# Patient Record
Sex: Female | Born: 1935 | Race: White | Hispanic: No | Marital: Married | Smoking: Never smoker
Health system: Southern US, Community
[De-identification: ages and names within clinical notes are randomized; demographics above are authoritative.]

## PROBLEM LIST (undated history)

## (undated) DIAGNOSIS — I480 Paroxysmal atrial fibrillation: Secondary | ICD-10-CM

## (undated) DIAGNOSIS — Q249 Congenital malformation of heart, unspecified: Secondary | ICD-10-CM

## (undated) DIAGNOSIS — I739 Peripheral vascular disease, unspecified: Secondary | ICD-10-CM

## (undated) DIAGNOSIS — I2510 Atherosclerotic heart disease of native coronary artery without angina pectoris: Secondary | ICD-10-CM

## (undated) DIAGNOSIS — I509 Heart failure, unspecified: Secondary | ICD-10-CM

## (undated) DIAGNOSIS — H409 Unspecified glaucoma: Secondary | ICD-10-CM

## (undated) DIAGNOSIS — I38 Endocarditis, valve unspecified: Secondary | ICD-10-CM

## (undated) DIAGNOSIS — M1990 Unspecified osteoarthritis, unspecified site: Secondary | ICD-10-CM

## (undated) DIAGNOSIS — I499 Cardiac arrhythmia, unspecified: Secondary | ICD-10-CM

## (undated) DIAGNOSIS — R011 Cardiac murmur, unspecified: Secondary | ICD-10-CM

## (undated) DIAGNOSIS — Z950 Presence of cardiac pacemaker: Secondary | ICD-10-CM

## (undated) DIAGNOSIS — Z951 Presence of aortocoronary bypass graft: Secondary | ICD-10-CM

## (undated) DIAGNOSIS — Z95810 Presence of automatic (implantable) cardiac defibrillator: Secondary | ICD-10-CM

## (undated) DIAGNOSIS — G43909 Migraine, unspecified, not intractable, without status migrainosus: Secondary | ICD-10-CM

## (undated) DIAGNOSIS — E119 Type 2 diabetes mellitus without complications: Secondary | ICD-10-CM

## (undated) DIAGNOSIS — E1122 Type 2 diabetes mellitus with diabetic chronic kidney disease: Secondary | ICD-10-CM

## (undated) DIAGNOSIS — R32 Unspecified urinary incontinence: Secondary | ICD-10-CM

## (undated) DIAGNOSIS — E785 Hyperlipidemia, unspecified: Secondary | ICD-10-CM

## (undated) DIAGNOSIS — I10 Essential (primary) hypertension: Secondary | ICD-10-CM

## (undated) DIAGNOSIS — I6521 Occlusion and stenosis of right carotid artery: Secondary | ICD-10-CM

## (undated) HISTORY — DX: Cardiac arrhythmia, unspecified: I49.9

## (undated) HISTORY — DX: Hyperlipidemia, unspecified: E78.5

## (undated) HISTORY — DX: Presence of automatic (implantable) cardiac defibrillator: Z95.810

## (undated) HISTORY — PX: PPM GENERATOR CHANGEOUT: EP1233

## (undated) HISTORY — DX: Presence of cardiac pacemaker: Z95.0

## (undated) HISTORY — DX: Unspecified glaucoma: H40.9

## (undated) HISTORY — DX: Unspecified osteoarthritis, unspecified site: M19.90

## (undated) HISTORY — PX: CORONARY ARTERY BYPASS GRAFT: SHX141

## (undated) HISTORY — PX: ABDOMINAL HYSTERECTOMY: SHX81

## (undated) HISTORY — DX: Unspecified urinary incontinence: R32

## (undated) HISTORY — DX: Migraine, unspecified, not intractable, without status migrainosus: G43.909

## (undated) HISTORY — DX: Type 2 diabetes mellitus without complications: E11.9

## (undated) HISTORY — DX: Congenital malformation of heart, unspecified: Q24.9

## (undated) HISTORY — PX: CARDIAC DEFIBRILLATOR PLACEMENT: SHX171

## (undated) HISTORY — DX: Cardiac murmur, unspecified: R01.1

---
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
## 1978-07-07 HISTORY — PX: BACK SURGERY: SHX140

## 2013-07-07 HISTORY — PX: HIP FRACTURE SURGERY: SHX118

## 2019-04-01 ENCOUNTER — Ambulatory Visit (INDEPENDENT_AMBULATORY_CARE_PROVIDER_SITE_OTHER): Payer: Medicare Other | Admitting: Family Medicine

## 2019-04-01 ENCOUNTER — Other Ambulatory Visit: Payer: Self-pay

## 2019-04-01 ENCOUNTER — Encounter: Payer: Self-pay | Admitting: Family Medicine

## 2019-04-01 VITALS — BP 118/60 | HR 63 | Temp 98.6°F | Ht 66.0 in | Wt 105.4 lb

## 2019-04-01 DIAGNOSIS — D508 Other iron deficiency anemias: Secondary | ICD-10-CM | POA: Diagnosis not present

## 2019-04-01 DIAGNOSIS — Z9181 History of falling: Secondary | ICD-10-CM

## 2019-04-01 DIAGNOSIS — Z23 Encounter for immunization: Secondary | ICD-10-CM | POA: Diagnosis not present

## 2019-04-01 DIAGNOSIS — I509 Heart failure, unspecified: Secondary | ICD-10-CM | POA: Insufficient documentation

## 2019-04-01 DIAGNOSIS — G4700 Insomnia, unspecified: Secondary | ICD-10-CM

## 2019-04-01 DIAGNOSIS — Z951 Presence of aortocoronary bypass graft: Secondary | ICD-10-CM

## 2019-04-01 DIAGNOSIS — I2510 Atherosclerotic heart disease of native coronary artery without angina pectoris: Secondary | ICD-10-CM

## 2019-04-01 DIAGNOSIS — E119 Type 2 diabetes mellitus without complications: Secondary | ICD-10-CM | POA: Diagnosis not present

## 2019-04-01 DIAGNOSIS — I38 Endocarditis, valve unspecified: Secondary | ICD-10-CM | POA: Insufficient documentation

## 2019-04-01 DIAGNOSIS — I480 Paroxysmal atrial fibrillation: Secondary | ICD-10-CM

## 2019-04-01 DIAGNOSIS — R531 Weakness: Secondary | ICD-10-CM

## 2019-04-01 DIAGNOSIS — I10 Essential (primary) hypertension: Secondary | ICD-10-CM

## 2019-04-01 DIAGNOSIS — I6521 Occlusion and stenosis of right carotid artery: Secondary | ICD-10-CM

## 2019-04-01 DIAGNOSIS — E1122 Type 2 diabetes mellitus with diabetic chronic kidney disease: Secondary | ICD-10-CM

## 2019-04-01 DIAGNOSIS — Z95810 Presence of automatic (implantable) cardiac defibrillator: Secondary | ICD-10-CM | POA: Insufficient documentation

## 2019-04-01 DIAGNOSIS — I739 Peripheral vascular disease, unspecified: Secondary | ICD-10-CM

## 2019-04-01 DIAGNOSIS — H409 Unspecified glaucoma: Secondary | ICD-10-CM | POA: Insufficient documentation

## 2019-04-01 HISTORY — DX: Atherosclerotic heart disease of native coronary artery without angina pectoris: I25.10

## 2019-04-01 HISTORY — DX: Essential (primary) hypertension: I10

## 2019-04-01 HISTORY — DX: Occlusion and stenosis of right carotid artery: I65.21

## 2019-04-01 HISTORY — DX: Heart failure, unspecified: I50.9

## 2019-04-01 HISTORY — DX: Presence of aortocoronary bypass graft: Z95.1

## 2019-04-01 HISTORY — DX: Type 2 diabetes mellitus with diabetic chronic kidney disease: E11.22

## 2019-04-01 HISTORY — DX: Paroxysmal atrial fibrillation: I48.0

## 2019-04-01 HISTORY — DX: Peripheral vascular disease, unspecified: I73.9

## 2019-04-01 HISTORY — DX: Endocarditis, valve unspecified: I38

## 2019-04-01 LAB — CBC WITH DIFFERENTIAL/PLATELET
Basophils Absolute: 0 10*3/uL (ref 0.0–0.1)
Basophils Relative: 0.5 % (ref 0.0–3.0)
Eosinophils Absolute: 0.2 10*3/uL (ref 0.0–0.7)
Eosinophils Relative: 2 % (ref 0.0–5.0)
HCT: 36.2 % (ref 36.0–46.0)
Hemoglobin: 12 g/dL (ref 12.0–15.0)
Lymphocytes Relative: 25 % (ref 12.0–46.0)
Lymphs Abs: 2 10*3/uL (ref 0.7–4.0)
MCHC: 33 g/dL (ref 30.0–36.0)
MCV: 91.3 fl (ref 78.0–100.0)
Monocytes Absolute: 0.4 10*3/uL (ref 0.1–1.0)
Monocytes Relative: 5.7 % (ref 3.0–12.0)
Neutro Abs: 5.3 10*3/uL (ref 1.4–7.7)
Neutrophils Relative %: 66.8 % (ref 43.0–77.0)
Platelets: 266 10*3/uL (ref 150.0–400.0)
RBC: 3.97 Mil/uL (ref 3.87–5.11)
RDW: 14 % (ref 11.5–15.5)
WBC: 7.9 10*3/uL (ref 4.0–10.5)

## 2019-04-01 LAB — COMPREHENSIVE METABOLIC PANEL
ALT: 8 U/L (ref 0–35)
AST: 14 U/L (ref 0–37)
Albumin: 4.4 g/dL (ref 3.5–5.2)
Alkaline Phosphatase: 49 U/L (ref 39–117)
BUN: 36 mg/dL — ABNORMAL HIGH (ref 6–23)
CO2: 21 mEq/L (ref 19–32)
Calcium: 9.6 mg/dL (ref 8.4–10.5)
Chloride: 107 mEq/L (ref 96–112)
Creatinine, Ser: 1.49 mg/dL — ABNORMAL HIGH (ref 0.40–1.20)
GFR: 32.97 mL/min — ABNORMAL LOW (ref >60.00)
Glucose, Bld: 92 mg/dL (ref 70–99)
Potassium: 4.8 mEq/L (ref 3.5–5.1)
Sodium: 138 mEq/L (ref 135–145)
Total Bilirubin: 0.5 mg/dL (ref 0.2–1.2)
Total Protein: 7.1 g/dL (ref 6.0–8.3)

## 2019-04-01 LAB — IBC + FERRITIN
Ferritin: 102 ng/mL (ref 10.0–291.0)
Iron: 67 ug/dL (ref 42–145)
Saturation Ratios: 18.3 % — ABNORMAL LOW (ref 20.0–50.0)
Transferrin: 261 mg/dL (ref 212.0–360.0)

## 2019-04-01 LAB — HEMOGLOBIN A1C: Hgb A1c MFr Bld: 6.3 % (ref 4.6–6.5)

## 2019-04-01 LAB — LIPID PANEL
Cholesterol: 170 mg/dL (ref 0–200)
HDL: 63.8 mg/dL (ref >39.00)
LDL Cholesterol: 89 mg/dL (ref 0–99)
NonHDL: 106.52
Total CHOL/HDL Ratio: 3
Triglycerides: 87 mg/dL (ref 0.0–149.0)
VLDL: 17.4 mg/dL (ref 0.0–40.0)

## 2019-04-01 MED ORDER — MULTAQ 400 MG PO TABS: 400.0000 mg | ORAL_TABLET | Freq: Two times a day (BID) | ORAL | 1 refills | Status: DC

## 2019-04-01 NOTE — Patient Instructions (Signed)
For insomnia: 1) melatonin, chamomile tea and lavender oil to start 2) if can't fall asleep get a book and read 3) if this isn't helpful we can try trazodone.  4) another natural drug is ashwagandha, just clear it with cardiology first. 300mg  before bed.   Please get records for shots/etc..   Checking labs today. See you back in 1-3 months.   So nice to meet you! Dr. Rogers Blocker

## 2019-04-01 NOTE — Progress Notes (Signed)
Patient: Amy Wolf MRN: [id number] DOB: 08-27-1927 PCP: Orma Flaming, MD     Subjective:  Chief Complaint  Patient presents with  . Establish Care  . Insomnia    HPI: The patient is a 85 y.o. female who presents today for establishing care. She has an extensive cardiac history and has complaints of insomnia.   Hypertension: Here for follow up of hypertension.  Currently on norvasc 10mg , lopressor 10mg  and altace 2.5mg .  Takes medication as prescribed and denies any side effects. Exercise includes none. Weight has been stable. Denies any chest pain, headaches, shortness of breath, vision changes, swelling in lower extremities.   Diabetes: Patient is here for follow up of type 2 diabetes. First diagnosed years ago.  Currently on the following medications glipizide at 10mg . Takes medications as prescribed. Last A1C was unknown. Currently not exercising and not following diabetic diet. Does not check sugars. Denies any hypoglycemic events. Denies any vision changes, nausea, vomiting, abdominal pain, ulcers/paraesthesia in feet, polyuria, polydipsia or polyphagia. Denies any chest pain, shortness of breath. Was being treated for diabetes by cardiologist who retired.   CAD with history of a CABG: unsure when cabg was. On pravachol 80mg  and beta blocker. No records with this procedure, no echo. Records do say last EF was 55% in 2018. Already have appointment with cardiology.   CHF: no records but has ICD placed. On beta blocker and ACE-I. euvolemic today. Routine lab work and has f/u with cardiology. Needs echo, will let them order.   Paroxysmal afib: per cardiology note in June of 2020-was on warafarin in the remote past. This was stopped due to renal function. Currently on plavix and ASA was added by vascular surgeon for carotid stenosis. Cardiology wanted to put back on warfarin. CHADS2 score is 8. Discussed with family and she is a very high fall risk. In sinus rhythm.   Right carotid  atherosclerosis: report shows 70-99% blockage. Surgeon told family 80%. Put on ASA. Last carotid ultrasound 12/2018. No follow up here. Will discuss with cardiology.   Insomnia: she states when she goes to bed she gets "wide awake." she has a problem falling asleep, not staying asleep. This has gotten worse since they moved. She will go to bed at 9:00pm and at times will be awake all night. She does not have screen time prior to bed, no naps after 2:00pm, no caffiene. No exercise and it's hard for her to walk. She is in [facility] She has not taken any melatonin. She did take benadryl and this did not help her at all. She has taken px drugs for sleep in the past for a short term. Daughter is here and states her memory is pretty intact. She sleeps in separate room from her husband. She does not snore. A good night of sleep is 4-6 hours/day. She is not really tired during the day. No RLS symptoms.   Weak/fall risk: in [facility] when leaving home. Uses cane around home, but has fallen multiple times. Refuses to use walker. Very weak/debilitated. Interested in [place].   Review of Systems  Constitutional: Negative for chills, fatigue and fever.  HENT: Negative for dental problem, ear pain, hearing loss and trouble swallowing.   Eyes: Negative for visual disturbance.  Respiratory: Negative for cough, chest tightness and shortness of breath.   Cardiovascular: Negative for chest pain, palpitations and leg swelling.  Gastrointestinal: Negative for abdominal pain, blood in stool, diarrhea, nausea and vomiting.  Endocrine: Negative for cold intolerance, polydipsia, polyphagia and polyuria.  Genitourinary: Negative for dysuria and hematuria.  Musculoskeletal: Negative for arthralgias, back pain and neck pain.  Skin: Negative for rash.  Neurological: Positive for weakness (debilitated ). Negative for dizziness, speech difficulty and headaches.  Psychiatric/Behavioral: Positive for sleep disturbance. Negative for confusion and  dysphoric mood. The patient is not nervous/anxious.     Allergies Patient is allergic to iodine; erythromycin; penicillins; and sulfa antibiotics.  Past Medical History Patient  has a past medical history of AICD (automatic cardioverter/defibrillator) present, Glaucoma, Hyperlipemia, and Pacemaker.  Surgical History Patient  has a past surgical history that includes Abdominal hysterectomy; Back surgery (1980); Hip fracture surgery (2015); Coronary artery bypass graft; Cardiac defibrillator placement; and PPM GENERATOR CHANGEOUT.  Family History Pateint's family history is not on file.  Social History Patient  reports that she has never smoked. She has never used smokeless tobacco. She reports previous alcohol use. She reports that she does not use drugs.    Objective: Vitals:   04/01/19 1036  BP: 118/60  Pulse: 63  Temp: 98.6 F (37 C)  TempSrc: Skin  SpO2: 97%  Weight: 105 lb 6.4 oz (47.8 kg)  Height: 5\' 6"  (1.676 m)    Body mass index is 17.01 kg/m.  Physical Exam Vitals signs reviewed.  Constitutional:      Comments: Cachetic and frail appearing in WC  HENT:     Head: Normocephalic and atraumatic.     Right Ear: External ear normal.     Left Ear: External ear normal.     Nose: Nose normal.  Neck:     Musculoskeletal: Normal range of motion and neck supple.     Vascular: Carotid bruit (bilaterally ) present.  Cardiovascular:     Rate and Rhythm: Normal rate and regular rhythm.     Heart sounds: Normal heart sounds.     Comments: dpt pulses poor bilaterally  Pulmonary:     Effort: Pulmonary effort is normal.     Breath sounds: Normal breath sounds.  Abdominal:     General: Abdomen is flat. Bowel sounds are normal.     Palpations: Abdomen is soft.  Skin:    General: Skin is warm and dry.     Comments: Bruising over forearms bilaterally   Neurological:     General: No focal deficit present.     Mental Status: She is alert.  Psychiatric:        Mood and  Affect: Mood normal.        Behavior: Behavior normal.        Depression screen [facility] 2/9 04/01/2019  Decreased Interest 3  Down, Depressed, Hopeless 0  PHQ - 2 Score 3  Altered sleeping 3  Tired, decreased energy 0  Change in appetite 3  Feeling bad or failure about yourself  0  Trouble concentrating 0  Moving slowly or fidgety/restless 0  Suicidal thoughts 0  PHQ-9 Score 9  Difficult doing work/chores Somewhat difficult    Assessment/plan: 1. Benign essential HTN Blood pressure is to goal. Continue current anti-hypertensive medications. Refills not needed. routine lab work will be done today. Recommended routine exercise (to what she can do) and healthy diet including DASH diet and mediterranean diet.F/u in 6 months.   - Lipid panel  2. Controlled type 2 diabetes mellitus without complication, without long-term current use of insulin (HCC) On glipizide 10mg  daily. Discussed I do not like this drug due to common hypoglycemic episodes/risks. Will check a1c today and see where she is at. At 85 years  of age, I do not want her tightly controlled. Has seen eye doctor, request records. She is sure she got her pnuemonia shot, but need this record. F/u in 3 months.  - Hemoglobin A1c - CBC with Differential/Platelet - Comprehensive metabolic panel - Lipid panel  3. Coronary artery disease involving native coronary artery of native heart without angina pectoris Already has appointment cardiology. Continue beta blocker, pravachol, plavix.  - Lipid panel  4. Insomnia, unspecified type Discussed at her age I will not use ambien. She is doing sleep hygiene for the most part. Want her to try melatonin, lavender oil and possibly ashwagandha. If does not help we can do trial of low dose trazodone. Daughter is here and plan discussed as well as written down. F/u in 3 months.   5. Other iron deficiency anemia  - IBC + Ferritin  6. Paroxysmal A-fib (HCC) On plavix/asa. Was on warfarin in the  past and I think this was stopped due to anemia. Do not have all records here. Her CHADs2 score is an 8. Xarelto was not started due to wanting to get renal function from past cardiologist. He did intend to start this back, but she did not f/u as they moved here. Discussed with daughter and patient that her score is high and does indicate therapy; however, she is a high fall risk and I am not keen to start this. Would continue asa and discuss with cardiology. Sinus rhythm today.    7. Atherosclerosis of right carotid artery Recent carotid u/s. ASA added and now on asa +plavix. F/u with cardiology for routine monitoring.   8. At risk for falling  - Ambulatory referral to [place]  9. Weakness generalized  - Ambulatory referral to Home Health  10. Need for immunization against influenza  - Flu Vaccine QUAD High Dose(Fluad)   Return in about 3 months (around 07/01/2019) for insomnia/diabetes .   Orma Flaming, MD [place]   04/01/2019

## 2019-04-04 ENCOUNTER — Other Ambulatory Visit: Payer: Self-pay | Admitting: Family Medicine

## 2019-04-04 DIAGNOSIS — N183 Chronic kidney disease, stage 3 unspecified: Secondary | ICD-10-CM | POA: Insufficient documentation

## 2019-04-04 HISTORY — DX: Chronic kidney disease, stage 3 unspecified: N18.30

## 2019-04-04 MED ORDER — GLIPIZIDE ER 5 MG PO TB24: 5.0000 mg | ORAL_TABLET | Freq: Every day | ORAL | 1 refills | Status: DC

## 2019-04-05 ENCOUNTER — Telehealth: Payer: Self-pay

## 2019-04-05 NOTE — Telephone Encounter (Signed)
Copied from [place] [phone]. Topic: General - Other >> Apr 05, 2019  3:04 PM Pauline Good wrote: Reason for CRM: Dian Situ is stating Dr Rogers Blocker isn't pecos certified and can't sign home health orders and they need to know who can sign the orders.

## 2019-04-06 NOTE — Telephone Encounter (Signed)
Sending to [place]. This is the 4th time we have had an issue with my credentialing. I have signed other home health orders so not sure what is going on.  Orma Flaming, MD [place]

## 2019-04-07 NOTE — Progress Notes (Signed)
CARDIOLOGY CONSULT NOTE       Patient IDStefanee Wolf MRN: [id number] DOB/AGE: [birth year] 85 y.o.  Admit date: (Not on file) Referring Physician: Self recent move to area Primary Physician: Orma Flaming, MD Primary Cardiologist: New Reason for Consultation: CAD/PAF  Active Problems:   * No active hospital problems. *   HPI:  85 y.o. with HTN, DM, CAD, anemia and carotid disease. History of PAF. Currently not on anticoagulation ? Due to anemia and fall risk with age. CHADVASC 8.  Seen by new local primary Orma Flaming 04/01/19 Carotid report from [facility] [id number] reviewed LICA 0000000 and RICA 70-99% velocities on right PSV 2.93 m/sec EDV 0.5 m/sec She has had a previous right sided CVA   Records review is limited. CAD with AICD and previous mitral and tricuspid valve surgery with CABG  She has had GI bleed last year. She has very thin skin and is covered in bruises In NSR today with AV pacing No angina Daughter thinks she is a fall risk and won't use a walker No TIA symptoms. No CHF or angina Compliant with meds Indicates surgery was done at [facility] in 2015  Her AICD has not been checked in over a year   ROS All other systems reviewed and negative except as noted above  Past Medical History:  Diagnosis Date  . AICD (automatic cardioverter/defibrillator) present   . Arrhythmia   . Arthritis   . Atherosclerosis of right carotid artery 04/01/2019   83% blockage. Last ultrasound in 12/2018. Saw vascular surgery in June 2020. Put on DPT in June (asa added by Psychologist, sport and exercise)  . Benign essential HTN 04/01/2019  . CAD (coronary artery disease) 04/01/2019  . Cardiac arrhythmia due to congenital heart disease   . CHF (congestive heart failure) ([place]) 04/01/2019  . Chronic kidney disease (CKD), stage III (moderate) 04/04/2019  . Diabetes ([place])   . Glaucoma   . Heart murmur   . Hx of CABG 04/01/2019   Several years ago per family   . Hyperlipemia   . Migraines   .  Pacemaker   . Paroxysmal A-fib ([place]) 04/01/2019   warafin in the remote past, but changed to plavix. CHADS2 score of 8%. High fall risk...   . PVD (peripheral vascular disease) ([place]) 04/01/2019  . Type 2 diabetes mellitus with diabetic chronic kidney disease ([place]) 04/01/2019  . Urine incontinence   . Valvular heart disease 04/01/2019   Mitral and tricuspid annuloplasty     Family History  Problem Relation Age of Onset  . Cancer Father   . Cancer Sister   . Cancer Daughter     Social History   Socioeconomic History  . Marital status: Married    Spouse name: Not on file  . Number of children: Not on file  . Years of education: Not on file  . Highest education level: Not on file  Occupational History  . Not on file  Social Needs  . Financial resource strain: Not on file  . Food insecurity    Worry: Not on file    Inability: Not on file  . Transportation needs    Medical: Not on file    Non-medical: Not on file  Tobacco Use  . Smoking status: Never Smoker  . Smokeless tobacco: Never Used  Substance and Sexual Activity  . Alcohol use: Not Currently  . Drug use: Never  . Sexual activity: Not Currently  Lifestyle  . Physical activity    Days  per week: Not on file    Minutes per session: Not on file  . Stress: Not on file  Relationships  . Social Herbalist on phone: Not on file    Gets together: Not on file    Attends religious service: Not on file    Active member of club or organization: Not on file    Attends meetings of clubs or organizations: Not on file    Relationship status: Not on file  . Intimate partner violence    Fear of current or ex partner: Not on file    Emotionally abused: Not on file    Physically abused: Not on file    Forced sexual activity: Not on file  Other Topics Concern  . Not on file  Social History Narrative  . Not on file    Past Surgical History:  Procedure Laterality Date  . ABDOMINAL HYSTERECTOMY    . BACK SURGERY  1980   . CARDIAC DEFIBRILLATOR PLACEMENT    . CORONARY ARTERY BYPASS GRAFT    . HIP FRACTURE SURGERY  2015  . PPM GENERATOR CHANGEOUT          Physical Exam: Blood pressure 120/65, pulse 66, height 5\' 6"  (1.676 m), weight 105 lb (47.6 kg), SpO2 99 %.   Affect appropriate Frail elderly female  HEENT: normal Neck supple with no adenopathy JVP normal bilateral bruits no thyromegaly Lungs clear with no wheezing and good diaphragmatic motion Heart:  S1/S2 no murmur, no rub, gallop or click PMI enlarged AICD under left clavicle  Abdomen: benighn, BS positve, no tenderness, no AAA no bruit.  No HSM or HJR Distal pulses intact with no bruits No edema Neuro non-focal Skin warm and dry No muscular weakness   Labs:   Lab Results  Component Value Date   WBC 7.9 04/01/2019   HGB 12.0 04/01/2019   HCT 36.2 04/01/2019   MCV 91.3 04/01/2019   PLT 266.0 04/01/2019    Recent Labs  Lab 04/01/19 1133  NA 138  K 4.8  CL 107  CO2 21  BUN 36*  CREATININE 1.49*  CALCIUM 9.6  PROT 7.1  BILITOT 0.5  ALKPHOS 49  ALT 8  AST 14  GLUCOSE 92   No results found for: CKTOTAL, CKMB, CKMBINDEX, TROPONINI  Lab Results  Component Value Date   CHOL 170 04/01/2019   Lab Results  Component Value Date   HDL 63.80 04/01/2019   Lab Results  Component Value Date   LDLCALC 89 04/01/2019   Lab Results  Component Value Date   TRIG 87.0 04/01/2019   Lab Results  Component Value Date   CHOLHDL 3 04/01/2019   No results found for: LDLDIRECT    Radiology: No results found.  EKG: AV pacing no old ECG to compare    ASSESSMENT AND PLAN:   1. CAD/CABG:  Will get records from [place] no angina continue medical Rx 2.  Valve Dx:  History of TV/MV annuloplasty f/u echo to assess  3. PV Dx:  Carotid disease ? High grade on right will repeat duplex and refer to VVS continue ASA/Plavix  4. AICD/PPM:  Needs referral to EP and interrogation for degree of PAF 5. Anticoagulation defer for now until  we know afib burden ASA and plavix useful for her CAD and carotid disease Given history of GI bleed on eliquis would defer anticoagulation in favor of antiplatelet Rx unless afib burden is high  6. HLD:  Continue statin LDL  89 on 04/01/19  7. Anemia:  Hct 36.2 historically stable labs 04/01/19 continue iron replacement ferritin normal  8. DM: Discussed low carb diet.  Target hemoglobin A1c is 6.5 or less.  Continue current medications. A1c reasonable at 6.3  9.  HTN:  On ACE good control   Tests Ordered:  Carotid duplex and echo  Referral:  EP for AICD/PAF and VVS for carotid disease  F/u with me in 6 months   Signed: Jenkins Rouge 04/08/2019, 11:27 AM

## 2019-04-08 ENCOUNTER — Encounter: Payer: Self-pay | Admitting: Cardiovascular Disease

## 2019-04-08 ENCOUNTER — Other Ambulatory Visit: Payer: Self-pay

## 2019-04-08 ENCOUNTER — Encounter: Payer: Self-pay | Admitting: Family Medicine

## 2019-04-08 ENCOUNTER — Ambulatory Visit (INDEPENDENT_AMBULATORY_CARE_PROVIDER_SITE_OTHER): Payer: Medicare Other | Admitting: Cardiovascular Disease

## 2019-04-08 VITALS — BP 120/65 | HR 66 | Ht 66.0 in | Wt 105.0 lb

## 2019-04-08 DIAGNOSIS — I079 Rheumatic tricuspid valve disease, unspecified: Secondary | ICD-10-CM | POA: Diagnosis not present

## 2019-04-08 DIAGNOSIS — I25810 Atherosclerosis of coronary artery bypass graft(s) without angina pectoris: Secondary | ICD-10-CM | POA: Diagnosis not present

## 2019-04-08 DIAGNOSIS — I059 Rheumatic mitral valve disease, unspecified: Secondary | ICD-10-CM

## 2019-04-08 DIAGNOSIS — I6523 Occlusion and stenosis of bilateral carotid arteries: Secondary | ICD-10-CM

## 2019-04-08 DIAGNOSIS — I480 Paroxysmal atrial fibrillation: Secondary | ICD-10-CM

## 2019-04-08 NOTE — Patient Instructions (Addendum)
Medication Instructions:   If you need a refill on your cardiac medications before your next appointment, please call your pharmacy.   Lab work:  If you have labs (blood work) drawn today and your tests are completely normal, you will receive your results only by: Marland Kitchen MyChart Message (if you have MyChart) OR . A paper copy in the mail If you have any lab test that is abnormal or we need to change your treatment, we will call you to review the results.  Testing/Procedures: Your physician has requested that you have a carotid duplex. This test is an ultrasound of the carotid arteries in your neck. It looks at blood flow through these arteries that supply the brain with blood. Allow one hour for this exam. There are no restrictions or special instructions.  Your physician has requested that you have an echocardiogram. Echocardiography is a painless test that uses sound waves to create images of your heart. It provides your doctor with information about the size and shape of your heart and how well your heart's chambers and valves are working. This procedure takes approximately one hour. There are no restrictions for this procedure.  Follow-Up: At [facility], you and your health needs are our priority.  As part of our continuing mission to provide you with exceptional heart care, we have created designated Provider Care Teams.  These Care Teams include your primary Cardiologist (physician) and Advanced Practice Providers (APPs -  Physician Assistants and Nurse Practitioners) who all work together to provide you with the care you need, when you need it. You will need a follow up appointment in 6 months.  Please call our office 2 months in advance to schedule this appointment.  You may see Dr. Johnsie Cancel or one of the following Advanced Practice Providers on your designated Care Team:   Truitt Merle, NP Cecilie Kicks, NP . Kathyrn Drown, NP  You have been referred to Vascular Doctors for History of  Carotid Artery Disease  You have been referred to Electrophysiologist as new patient with device.

## 2019-04-12 ENCOUNTER — Other Ambulatory Visit: Payer: Self-pay

## 2019-04-12 ENCOUNTER — Ambulatory Visit (HOSPITAL_COMMUNITY): Payer: Medicare Other | Attending: Cardiology

## 2019-04-12 ENCOUNTER — Telehealth: Payer: Self-pay

## 2019-04-12 DIAGNOSIS — I25810 Atherosclerosis of coronary artery bypass graft(s) without angina pectoris: Secondary | ICD-10-CM

## 2019-04-12 DIAGNOSIS — I6523 Occlusion and stenosis of bilateral carotid arteries: Secondary | ICD-10-CM | POA: Diagnosis not present

## 2019-04-12 DIAGNOSIS — I059 Rheumatic mitral valve disease, unspecified: Secondary | ICD-10-CM | POA: Diagnosis present

## 2019-04-12 DIAGNOSIS — I079 Rheumatic tricuspid valve disease, unspecified: Secondary | ICD-10-CM | POA: Diagnosis present

## 2019-04-12 NOTE — Telephone Encounter (Signed)
I spoke with the pt daughter to get the pt ICD model and serial number. I put the model and serial number in [employer] and I enrolled the pt in [employer]. I ordered the pt a monitor and cell adapter.

## 2019-04-13 ENCOUNTER — Other Ambulatory Visit: Payer: Self-pay | Admitting: Cardiovascular Disease

## 2019-04-13 ENCOUNTER — Ambulatory Visit (HOSPITAL_COMMUNITY)
Admission: RE | Admit: 2019-04-13 | Discharge: 2019-04-13 | Disposition: A | Discharge status: Home / Self Care | Payer: Medicare Other | Source: Ambulatory Visit | Attending: Cardiology | Admitting: Cardiology

## 2019-04-13 ENCOUNTER — Telehealth: Payer: Self-pay

## 2019-04-13 DIAGNOSIS — I6523 Occlusion and stenosis of bilateral carotid arteries: Secondary | ICD-10-CM

## 2019-04-13 DIAGNOSIS — I6521 Occlusion and stenosis of right carotid artery: Secondary | ICD-10-CM

## 2019-04-13 NOTE — Telephone Encounter (Signed)
-----   Message from Josue Hector, MD sent at 04/13/2019  3:54 PM EDT ----- 40-59% RICA stenosis f/u duplex in a year

## 2019-04-13 NOTE — Telephone Encounter (Signed)
Patient's daughter aware of results. Per Dr. Johnsie Cancel, 40-59% RICA stenosis f/u duplex in a year. Will place order for carotid to be done in one year.

## 2019-04-14 NOTE — Telephone Encounter (Signed)
I  Sent an email to credentialing and there should be no issues since she is credentialed with CMS. I am unsure of why this is an issue. I have asked Dr. Rogers Blocker to update her NPI address.

## 2019-04-20 ENCOUNTER — Encounter: Payer: Self-pay | Admitting: Family Medicine

## 2019-04-21 ENCOUNTER — Telehealth: Payer: Self-pay

## 2019-04-21 NOTE — Telephone Encounter (Signed)
I spoke with the pt daughter and told her she can go ahead to set up the home monitor. I told her I would be more than happy to help her send the home remote transmission. I told her she do not need to bring the monitor with her to the office. I gave her my direct office number to receive help.

## 2019-04-22 ENCOUNTER — Ambulatory Visit (INDEPENDENT_AMBULATORY_CARE_PROVIDER_SITE_OTHER): Payer: Medicare Other | Admitting: *Deleted

## 2019-04-22 DIAGNOSIS — I480 Paroxysmal atrial fibrillation: Secondary | ICD-10-CM

## 2019-04-22 DIAGNOSIS — I509 Heart failure, unspecified: Secondary | ICD-10-CM

## 2019-04-23 ENCOUNTER — Encounter: Payer: Self-pay | Admitting: Family Medicine

## 2019-04-25 ENCOUNTER — Telehealth: Payer: Self-pay | Admitting: Family Medicine

## 2019-04-25 LAB — CUP PACEART REMOTE DEVICE CHECK
Battery Remaining Longevity: 29 mo
Battery Voltage: 2.94 V
Brady Statistic AP VP Percent: 53.03 %
Brady Statistic AP VS Percent: 4.88 %
Brady Statistic AS VP Percent: 33.62 %
Brady Statistic AS VS Percent: 8.47 %
Brady Statistic RA Percent Paced: 57.26 %
Brady Statistic RV Percent Paced: 86.61 %
Date Time Interrogation Session: 20201016191427
HighPow Impedance: 50 Ohm
Implantable Lead Location: 753862
Implantable Lead Special Function: 2015
Lead Channel Impedance Value: 304 Ohm
Lead Channel Impedance Value: 361 Ohm
Lead Channel Impedance Value: 418 Ohm
Lead Channel Pacing Threshold Amplitude: 0.625 V
Lead Channel Pacing Threshold Amplitude: 1.125 V
Lead Channel Pacing Threshold Pulse Width: 0.4 ms
Lead Channel Pacing Threshold Pulse Width: 0.4 ms
Lead Channel Sensing Intrinsic Amplitude: 0.875 mV
Lead Channel Sensing Intrinsic Amplitude: 0.875 mV
Lead Channel Sensing Intrinsic Amplitude: 8.25 mV
Lead Channel Sensing Intrinsic Amplitude: 8.25 mV
Lead Channel Setting Pacing Amplitude: 2 V
Lead Channel Setting Pacing Amplitude: 2.25 V
Lead Channel Setting Pacing Pulse Width: 0.4 ms
Lead Channel Setting Sensing Sensitivity: 0.3 mV

## 2019-04-25 NOTE — Telephone Encounter (Signed)
Ok to call and give verbal auth for this?

## 2019-04-25 NOTE — Telephone Encounter (Signed)
Home Health Verbal Orders - Caller/Agency: [facility] Callback Number: [phone] secure VM Requesting PT Frequency: 1x a week for 1 week 2xs a week for 6 weeks 1x a week for 2 weeks    During visit on Sat 10.17.20 Pt's BP was 120/56 seated and 120/45 standing

## 2019-04-25 NOTE — Telephone Encounter (Signed)
Left message on secure voicemail for Shanon Brow giving verbal orders for requested PT orders for patient.  Advised him to c/b if further questions.

## 2019-04-25 NOTE — Telephone Encounter (Signed)
Yes. Please give verbal orders for PT as recommended. Thanks.

## 2019-04-25 NOTE — Telephone Encounter (Signed)
See note

## 2019-04-25 NOTE — Telephone Encounter (Signed)
Will send to Dr. Jonni Sanger as she is doing this until my certification is done with [employer].   Amy Flaming, MD [place]

## 2019-04-27 NOTE — Progress Notes (Signed)
Remote ICD transmission.   

## 2019-05-02 ENCOUNTER — Ambulatory Visit (INDEPENDENT_AMBULATORY_CARE_PROVIDER_SITE_OTHER): Payer: Medicare Other | Admitting: Podiatry

## 2019-05-02 ENCOUNTER — Telehealth: Payer: Self-pay | Admitting: *Deleted

## 2019-05-02 ENCOUNTER — Encounter: Payer: Self-pay | Admitting: Cardiology

## 2019-05-02 ENCOUNTER — Other Ambulatory Visit: Payer: Self-pay

## 2019-05-02 ENCOUNTER — Encounter

## 2019-05-02 VITALS — BP 110/55 | HR 64

## 2019-05-02 DIAGNOSIS — E1151 Type 2 diabetes mellitus with diabetic peripheral angiopathy without gangrene: Secondary | ICD-10-CM

## 2019-05-02 DIAGNOSIS — M2042 Other hammer toe(s) (acquired), left foot: Secondary | ICD-10-CM

## 2019-05-02 DIAGNOSIS — M79674 Pain in right toe(s): Secondary | ICD-10-CM | POA: Diagnosis not present

## 2019-05-02 DIAGNOSIS — B351 Tinea unguium: Secondary | ICD-10-CM | POA: Diagnosis not present

## 2019-05-02 DIAGNOSIS — M2012 Hallux valgus (acquired), left foot: Secondary | ICD-10-CM

## 2019-05-02 DIAGNOSIS — M79675 Pain in left toe(s): Secondary | ICD-10-CM | POA: Diagnosis not present

## 2019-05-02 DIAGNOSIS — L84 Corns and callosities: Secondary | ICD-10-CM

## 2019-05-02 DIAGNOSIS — M2011 Hallux valgus (acquired), right foot: Secondary | ICD-10-CM

## 2019-05-02 DIAGNOSIS — M2041 Other hammer toe(s) (acquired), right foot: Secondary | ICD-10-CM | POA: Diagnosis not present

## 2019-05-02 MED ORDER — METOPROLOL TARTRATE 50 MG PO TABS: 75.0000 mg | ORAL_TABLET | Freq: Two times a day (BID) | ORAL | 3 refills | Status: DC

## 2019-05-02 NOTE — Telephone Encounter (Signed)
-----   Message from Will Meredith Leeds, MD sent at 04/25/2019 10:32 AM EDT ----- Abnormal device interrogation reviewed.  Lead parameters and battery status stable.  Multiple episodes of AF and one 2 second NSVT episode. Increase metoprolol to 75 mg BID.

## 2019-05-02 NOTE — Patient Instructions (Signed)
Diabetes Mellitus and Foot Care Foot care is an important part of your health, especially when you have diabetes. Diabetes may cause you to have problems because of poor blood flow (circulation) to your feet and legs, which can cause your skin to:  Become thinner and drier.  Break more easily.  Heal more slowly.  Peel and crack. You may also have nerve damage (neuropathy) in your legs and feet, causing decreased feeling in them. This means that you may not notice minor injuries to your feet that could lead to more serious problems. Noticing and addressing any potential problems early is the best way to prevent future foot problems. How to care for your feet Foot hygiene  Wash your feet daily with warm water and mild soap. Do not use hot water. Then, pat your feet and the areas between your toes until they are completely dry. Do not soak your feet as this can dry your skin.  Trim your toenails straight across. Do not dig under them or around the cuticle. File the edges of your nails with an emery board or nail file.  Apply a moisturizing lotion or petroleum jelly to the skin on your feet and to dry, brittle toenails. Use lotion that does not contain alcohol and is unscented. Do not apply lotion between your toes. Shoes and socks  Wear clean socks or stockings every day. Make sure they are not too tight. Do not wear knee-high stockings since they may decrease blood flow to your legs.  Wear shoes that fit properly and have enough cushioning. Always look in your shoes before you put them on to be sure there are no objects inside.  To break in new shoes, wear them for just a few hours a day. This prevents injuries on your feet. Wounds, scrapes, corns, and calluses  Check your feet daily for blisters, cuts, bruises, sores, and redness. If you cannot see the bottom of your feet, use a mirror or ask someone for help.  Do not cut corns or calluses or try to remove them with medicine.  If you  find a minor scrape, cut, or break in the skin on your feet, keep it and the skin around it clean and dry. You may clean these areas with mild soap and water. Do not clean the area with peroxide, alcohol, or iodine.  If you have a wound, scrape, corn, or callus on your foot, look at it several times a day to make sure it is healing and not infected. Check for: ? Redness, swelling, or pain. ? Fluid or blood. ? Warmth. ? Pus or a bad smell. General instructions  Do not cross your legs. This may decrease blood flow to your feet.  Do not use heating pads or hot water bottles on your feet. They may burn your skin. If you have lost feeling in your feet or legs, you may not know this is happening until it is too late.  Protect your feet from hot and cold by wearing shoes, such as at the beach or on hot pavement.  Schedule a complete foot exam at least once a year (annually) or more often if you have foot problems. If you have foot problems, report any cuts, sores, or bruises to your health care provider immediately. Contact a health care provider if:  You have a medical condition that increases your risk of infection and you have any cuts, sores, or bruises on your feet.  You have an injury that is not   healing.  You have redness on your legs or feet.  You feel burning or tingling in your legs or feet.  You have pain or cramps in your legs and feet.  Your legs or feet are numb.  Your feet always feel cold.  You have pain around a toenail. Get help right away if:  You have a wound, scrape, corn, or callus on your foot and: ? You have pain, swelling, or redness that gets worse. ? You have fluid or blood coming from the wound, scrape, corn, or callus. ? Your wound, scrape, corn, or callus feels warm to the touch. ? You have pus or a bad smell coming from the wound, scrape, corn, or callus. ? You have a fever. ? You have a red line going up your leg. Summary  Check your feet every day  for cuts, sores, red spots, swelling, and blisters.  Moisturize feet and legs daily.  Wear shoes that fit properly and have enough cushioning.  If you have foot problems, report any cuts, sores, or bruises to your health care provider immediately.  Schedule a complete foot exam at least once a year (annually) or more often if you have foot problems. This information is not intended to replace advice given to you by your health care provider. Make sure you discuss any questions you have with your health care provider. Document Released: 06/20/2000 Document Revised: 08/05/2017 Document Reviewed: 07/25/2016 Elsevier Patient Education  2020 Elsevier Inc.  

## 2019-05-02 NOTE — Telephone Encounter (Signed)
S/w pt's daughter who has been made aware of results and recommendations. Pt's daughter is agreeable to plan of care. New Rx has been sent in Metoprolol Tart 75 mg BID. Med list has been updated . Pt has appt tomorrow with Dr. Curt Bears. Pt's daughter thanked me for the call. Patient notified of result.  Please refer to phone note from today for complete details.   Julaine Hua, [place] 05/02/2019 5:30 PM

## 2019-05-03 ENCOUNTER — Encounter: Payer: Self-pay | Admitting: Cardiology

## 2019-05-03 ENCOUNTER — Ambulatory Visit (INDEPENDENT_AMBULATORY_CARE_PROVIDER_SITE_OTHER): Payer: Medicare Other | Admitting: Cardiology

## 2019-05-03 VITALS — BP 138/68 | HR 72 | Ht 66.0 in | Wt 107.6 lb

## 2019-05-03 DIAGNOSIS — I5022 Chronic systolic (congestive) heart failure: Secondary | ICD-10-CM

## 2019-05-03 DIAGNOSIS — I509 Heart failure, unspecified: Secondary | ICD-10-CM

## 2019-05-03 NOTE — Progress Notes (Signed)
Electrophysiology Office Note   Date:  05/03/2019   ID:  Amy Wolf, DOB 05-May-1928, MRN [id number]  PCP:  Orma Flaming, MD  Cardiologist:  Johnsie Cancel Primary Electrophysiologist:  Gertha Lichtenberg Meredith Leeds, MD    Chief Complaint: ICD   History of Present Illness: Amy Wolf is a 85 y.o. female who is being seen today for the evaluation of ICD at the request of Josue Hector, MD. Presenting today for electrophysiology evaluation.  He has a history of hypertension, diabetes, coronary artery disease, anemia, and carotid disease.  She also has paroxysmal atrial fibrillation currently not anticoagulated.  She has had falls in the past.  She is also had a mitral and tricuspid valve surgery with CABG.  She is status post Medtronic ICD.  She did have a GI bleed last year.  Today, she denies symptoms of palpitations, chest pain, shortness of breath, orthopnea, PND, lower extremity edema, claudication, dizziness, presyncope, syncope, bleeding, or neurologic sequela. The patient is tolerating medications without difficulties.    Past Medical History:  Diagnosis Date  . AICD (automatic cardioverter/defibrillator) present   . Arrhythmia   . Arthritis   . Atherosclerosis of right carotid artery 04/01/2019   80% blockage. Last ultrasound in 12/2018. Saw vascular surgery in June 2020. Put on DPT in June (asa added by Psychologist, sport and exercise)  . Benign essential HTN 04/01/2019  . CAD (coronary artery disease) 04/01/2019  . Cardiac arrhythmia due to congenital heart disease   . CHF (congestive heart failure) ([place]) 04/01/2019  . Chronic kidney disease (CKD), stage III (moderate) 04/04/2019  . Diabetes ([place])   . Glaucoma   . Heart murmur   . Hx of CABG 04/01/2019   Several years ago per family   . Hyperlipemia   . Migraines   . Pacemaker   . Paroxysmal A-fib ([place]) 04/01/2019   warafin in the remote past, but changed to plavix. CHADS2 score of 8%. High fall risk...   . PVD (peripheral vascular disease) ([place]) 04/01/2019  .  Type 2 diabetes mellitus with diabetic chronic kidney disease ([place]) 04/01/2019  . Urine incontinence   . Valvular heart disease 04/01/2019   Mitral and tricuspid annuloplasty    Past Surgical History:  Procedure Laterality Date  . ABDOMINAL HYSTERECTOMY    . BACK SURGERY  1980  . CARDIAC DEFIBRILLATOR PLACEMENT    . CORONARY ARTERY BYPASS GRAFT    . HIP FRACTURE SURGERY  2015  . PPM GENERATOR CHANGEOUT       Current Outpatient Medications  Medication Sig Dispense Refill  . acetaminophen (TYLENOL) 500 MG tablet Take 500 mg by mouth daily.    Marland Kitchen amLODipine (NORVASC) 10 MG tablet Take 10 mg by mouth daily.    Marland Kitchen aspirin 81 MG chewable tablet Chew by mouth daily.    . bimatoprost (LUMIGAN) 0.01 % SOLN Place 1 drop into both eyes at bedtime.    . clopidogrel (PLAVIX) 75 MG tablet Take 75 mg by mouth daily.    . ferrous sulfate 325 (65 FE) MG tablet Take 325 mg by mouth daily with breakfast.     . glipiZIDE (GLUCOTROL XL) 5 MG 24 hr tablet Take 1 tablet (5 mg total) by mouth daily. 90 tablet 1  . Melatonin 3 MG CAPS Take 3 mg by mouth at bedtime.     . metoprolol tartrate (LOPRESSOR) 50 MG tablet Take 1.5 tablets (75 mg total) by mouth 2 (two) times daily. 270 tablet 3  . MULTAQ 400 MG tablet Take 1 tablet (  400 mg total) by mouth 2 (two) times daily with a meal. 180 tablet 1  . Netarsudil Dimesylate (RHOPRESSA) 0.02 % SOLN Apply to eye. 1 gtt both eyes q hs    . pravastatin (PRAVACHOL) 80 MG tablet Take 80 mg by mouth daily.    . ramipril (ALTACE) 2.5 MG capsule Take by mouth daily.     No current facility-administered medications for this visit.     Allergies:   Iodine, Erythromycin, Penicillins, and Sulfa antibiotics   Social History:  The patient  reports that she has never smoked. She has never used smokeless tobacco. She reports previous alcohol use. She reports that she does not use drugs.   Family History:  The patient's family history includes Cancer in her daughter, father, and  sister.    ROS:  Please see the history of present illness.   Otherwise, review of systems is positive for none.   All other systems are reviewed and negative.    PHYSICAL EXAM: VS:  BP 138/68   Pulse 72   Ht 5\' 6"  (1.676 m)   Wt 107 lb 9.6 oz (48.8 kg)   SpO2 98%   BMI 17.37 kg/m  , BMI Body mass index is 17.37 kg/m. GEN: Well nourished, well developed, in no acute distress  HEENT: normal  Neck: no JVD, carotid bruits, or masses Cardiac: RRR; no murmurs, rubs, or gallops,no edema  Respiratory:  clear to auscultation bilaterally, normal work of breathing GI: soft, nontender, nondistended, + BS MS: no deformity or atrophy  Skin: warm and dry, device pocket is well healed Neuro:  Strength and sensation are intact Psych: euthymic mood, full affect  EKG:  EKG is not ordered today. Personal review of the ekg ordered 04/08/19 shows AV paced  Device interrogation is reviewed today in detail.  See PaceArt for details.   Recent Labs: 04/01/2019: ALT 8; BUN 36; Creatinine, Ser 1.49; Hemoglobin 12.0; Platelets 266.0; Potassium 4.8; Sodium 138    Lipid Panel     Component Value Date/Time   CHOL 170 04/01/2019 1133   TRIG 87.0 04/01/2019 1133   HDL 63.80 04/01/2019 1133   CHOLHDL 3 04/01/2019 1133   VLDL 17.4 04/01/2019 1133   LDLCALC 89 04/01/2019 1133     Wt Readings from Last 3 Encounters:  05/03/19 107 lb 9.6 oz (48.8 kg)  04/08/19 105 lb (47.6 kg)  04/01/19 105 lb 6.4 oz (47.8 kg)      Other studies Reviewed: Additional studies/ records that were reviewed today include: TTE 04/12/19  Review of the above records today demonstrates:   1. Left ventricular ejection fraction, by visual estimation, is 30%. The left ventricle has normal function. Normal left ventricular size. There is mildly increased left ventricular hypertrophy. Septal-lateral dyssynchrony with diffuse hypokinesis.  2. Left ventricular diastolic Doppler parameters are indeterminate pattern of LV diastolic  filling.  3. S/p tricuspid valve repair. No more than moderate tricuspid regurgitation visually though thick CW doppler jet raises concern for more significant tricuspid regurgitation.  4. S/p mitral valve repair. No more than moderate mitral regurgitation visually, though thick CW doppler jet raises concern for more significant mitral regurgitation. Mean gradient 3 mmHg across the mitral valve, no evidence for significant stenosis.  5. The aortic valve is tricuspid Aortic valve regurgitation is trivial by color flow Doppler. Mild aortic valve sclerosis without stenosis.  6. Global right ventricle has mildly reduced systolic function.The right ventricular size is mildly enlarged. No increase in right ventricular wall thickness.  7. A pacer wire is visualized in the RV.  8. The tricuspid valve is normal in structure. Tricuspid valve regurgitation is mild.  9. Right atrial size was moderately dilated. 10. Left atrial size was severely dilated. 11. The inferior vena cava is dilated in size with >50% respiratory variability, suggesting right atrial pressure of 8 mmHg. 12. The tricuspid regurgitant velocity is 3.35 m/s, and with an assumed right atrial pressure of 8 mmHg, the estimated right ventricular systolic pressure is moderately elevated at 52.9 mmHg.   ASSESSMENT AND PLAN:  1.  Coronary artery disease: Status post CABG.  No current chest pain.  2.  Valvular heart disease: Status post tricuspid valve and mitral valve annuloplasty.  Echo shows stable results.  Plan per primary cardiology.  3.  Chronic systolic heart failure: Likely due to ischemic cardiomyopathy.  Has a Medtronic ICD in place.  Device functioning appropriately.  No changes at this time.  4.  Carotid artery disease: Has been referred to vascular surgery.  5.  Hypertension: Mildly elevated today but has been better controlled in the past.  No changes.  6.  Paroxysmal atrial fibrillation: Currently not anticoagulated due to GI  bleed and falls.  On aspirin Plavix.  I spoke with her family about her stroke risk, and they feel that due to GI bleeding, she would prefer not to be anticoagulated.  She is in a 2-1 atrial tachycardia currently.  We have changed her mode switch rate on her defibrillator, as it was set quite high and I am concerned that we may not be seeing all of her arrhythmias.  We Letzy Gullickson get a more accurate arrhythmia burden on her next check.  CHADS2VASc of 8.    Current medicines are reviewed at length with the patient today.   The patient does not have concerns regarding her medicines.  The following changes were made today:  none  Labs/ tests ordered today include:  No orders of the defined types were placed in this encounter.    Disposition:   FU with Keghan Mcfarren 1 year  Signed, Keymiah Lyles Meredith Leeds, MD  05/03/2019 10:59 AM     [facility] [address] [place] [address] [phone] (office) [phone] (fax)

## 2019-05-03 NOTE — Patient Instructions (Addendum)
Medication Instructions:  Your physician recommends that you continue on your current medications as directed. Please refer to the Current Medication list given to you today.  *If you need a refill on your cardiac medications before your next appointment, please call your pharmacy*  Labwork: None ordered  Testing/Procedures: None ordered  Follow-Up: Remote monitoring is used to monitor your Pacemaker or ICD from home. This monitoring reduces the number of office visits required to check your device to one time per year. It allows [place] to keep an eye on the functioning of your device to ensure it is working properly. You are scheduled for a device check from home on 07/22/2019. You may send your transmission at any time that day. If you have a wireless device, the transmission will be sent automatically. After your physician reviews your transmission, you will receive a postcard with your next transmission date.  At [facility], you and your health needs are our priority.  As part of our continuing mission to provide you with exceptional heart care, we have created designated Provider Care Teams.  These Care Teams include your primary Cardiologist (physician) and Advanced Practice Providers (APPs -  Physician Assistants and Nurse Practitioners) who all work together to provide you with the care you need, when you need it.  You will need a follow up appointment in 12 months.  Please call our office 2 months in advance to schedule this appointment.  You may see Dr Curt Bears or one of the following Advanced Practice Providers on your designated Care Team:    Chanetta Marshall, NP  Tommye Standard, PA-C  Oda Kilts, [place]  Thank you for choosing [facility]!!   Trinidad Curet, RN [phone]

## 2019-05-04 DIAGNOSIS — Z951 Presence of aortocoronary bypass graft: Secondary | ICD-10-CM

## 2019-05-04 DIAGNOSIS — Z7982 Long term (current) use of aspirin: Secondary | ICD-10-CM

## 2019-05-04 DIAGNOSIS — I110 Hypertensive heart disease with heart failure: Secondary | ICD-10-CM | POA: Diagnosis not present

## 2019-05-04 DIAGNOSIS — Z95810 Presence of automatic (implantable) cardiac defibrillator: Secondary | ICD-10-CM

## 2019-05-04 DIAGNOSIS — E1151 Type 2 diabetes mellitus with diabetic peripheral angiopathy without gangrene: Secondary | ICD-10-CM

## 2019-05-04 DIAGNOSIS — I509 Heart failure, unspecified: Secondary | ICD-10-CM | POA: Diagnosis not present

## 2019-05-04 DIAGNOSIS — I2510 Atherosclerotic heart disease of native coronary artery without angina pectoris: Secondary | ICD-10-CM | POA: Diagnosis not present

## 2019-05-04 DIAGNOSIS — I6521 Occlusion and stenosis of right carotid artery: Secondary | ICD-10-CM

## 2019-05-04 DIAGNOSIS — I480 Paroxysmal atrial fibrillation: Secondary | ICD-10-CM

## 2019-05-04 DIAGNOSIS — R631 Polydipsia: Secondary | ICD-10-CM

## 2019-05-04 DIAGNOSIS — G4700 Insomnia, unspecified: Secondary | ICD-10-CM

## 2019-05-04 DIAGNOSIS — D508 Other iron deficiency anemias: Secondary | ICD-10-CM | POA: Diagnosis not present

## 2019-05-04 DIAGNOSIS — Z7984 Long term (current) use of oral hypoglycemic drugs: Secondary | ICD-10-CM

## 2019-05-04 DIAGNOSIS — Z7902 Long term (current) use of antithrombotics/antiplatelets: Secondary | ICD-10-CM

## 2019-05-04 DIAGNOSIS — H409 Unspecified glaucoma: Secondary | ICD-10-CM

## 2019-05-04 NOTE — Progress Notes (Signed)
Subjective: Amy Wolf presents today referred by Orma Flaming, MD with cc of painful, discolored, thick toenails which interfere with daily activities.  Pain is aggravated when wearing enclosed shoe gear.   She relates concern of callus on bottom of right foot. She has been applying lotion to keep it soft.  Patient's daughter is present during the visit. Mother has h/o diabetes.   Past Medical History:  Diagnosis Date  . AICD (automatic cardioverter/defibrillator) present   . Arrhythmia   . Arthritis   . Atherosclerosis of right carotid artery 04/01/2019   80% blockage. Last ultrasound in 12/2018. Saw vascular surgery in June 2020. Put on DPT in June (asa added by Psychologist, sport and exercise)  . Benign essential HTN 04/01/2019  . CAD (coronary artery disease) 04/01/2019  . Cardiac arrhythmia due to congenital heart disease   . CHF (congestive heart failure) ([place]) 04/01/2019  . Chronic kidney disease (CKD), stage III (moderate) 04/04/2019  . Diabetes ([place])   . Glaucoma   . Heart murmur   . Hx of CABG 04/01/2019   Several years ago per family   . Hyperlipemia   . Migraines   . Pacemaker   . Paroxysmal A-fib ([place]) 04/01/2019   warafin in the remote past, but changed to plavix. CHADS2 score of 8%. High fall risk...   . PVD (peripheral vascular disease) ([place]) 04/01/2019  . Type 2 diabetes mellitus with diabetic chronic kidney disease ([place]) 04/01/2019  . Urine incontinence   . Valvular heart disease 04/01/2019   Mitral and tricuspid annuloplasty      Patient Active Problem List   Diagnosis Date Noted  . Chronic kidney disease (CKD), stage III (moderate) 04/04/2019  . Benign essential HTN 04/01/2019  . Type 2 diabetes mellitus with diabetic chronic kidney disease ([place]) 04/01/2019  . CAD (coronary artery disease) 04/01/2019  . Hx of CABG 04/01/2019  . Paroxysmal A-fib ([place]) 04/01/2019  . Atherosclerosis of right carotid artery 04/01/2019  . PVD (peripheral vascular disease) ([place]) 04/01/2019  . AICD  (automatic cardioverter/defibrillator) present 04/01/2019  . Valvular heart disease 04/01/2019  . CHF (congestive heart failure) ([place]) 04/01/2019  . Glaucoma 04/01/2019     Past Surgical History:  Procedure Laterality Date  . ABDOMINAL HYSTERECTOMY    . BACK SURGERY  1980  . CARDIAC DEFIBRILLATOR PLACEMENT    . CORONARY ARTERY BYPASS GRAFT    . HIP FRACTURE SURGERY  2015  . PPM GENERATOR CHANGEOUT       Current Outpatient Medications on File Prior to Visit  Medication Sig Dispense Refill  . acetaminophen (TYLENOL) 500 MG tablet Take 500 mg by mouth daily.    Marland Kitchen amLODipine (NORVASC) 10 MG tablet Take 10 mg by mouth daily.    Marland Kitchen aspirin 81 MG chewable tablet Chew by mouth daily.    . bimatoprost (LUMIGAN) 0.01 % SOLN Place 1 drop into both eyes at bedtime.    . clopidogrel (PLAVIX) 75 MG tablet Take 75 mg by mouth daily.    . ferrous sulfate 325 (65 FE) MG tablet Take 325 mg by mouth daily with breakfast.     . glipiZIDE (GLUCOTROL XL) 5 MG 24 hr tablet Take 1 tablet (5 mg total) by mouth daily. 90 tablet 1  . Melatonin 3 MG CAPS Take 3 mg by mouth at bedtime.     . MULTAQ 400 MG tablet Take 1 tablet (400 mg total) by mouth 2 (two) times daily with a meal. 180 tablet 1  . Netarsudil Dimesylate (RHOPRESSA) 0.02 % SOLN  Apply to eye. 1 gtt both eyes q hs    . pravastatin (PRAVACHOL) 80 MG tablet Take 80 mg by mouth daily.    . ramipril (ALTACE) 2.5 MG capsule Take by mouth daily.     No current facility-administered medications on file prior to visit.      Allergies  Allergen Reactions  . Iodine Other (See Comments)  . Erythromycin Rash  . Penicillins Rash  . Sulfa Antibiotics Rash     Social History   Occupational History  . Not on file  Tobacco Use  . Smoking status: Never Smoker  . Smokeless tobacco: Never Used  Substance and Sexual Activity  . Alcohol use: Not Currently  . Drug use: Never  . Sexual activity: Not Currently     Family History  Problem Relation Age  of Onset  . Cancer Father   . Cancer Sister   . Cancer Daughter      Immunization History  Administered Date(s) Administered  . Fluad Quad(high Dose 65+) 04/01/2019     Review of systems: Positive Findings in bold print.  Constitutional:  chills, fatigue, fever, sweats, weight change Communication: Optometrist, sign Ecologist, hand writing, iPad/Android device Head: headaches, head injury Eyes: changes in vision, eye pain, glaucoma, cataracts, macular degeneration, diplopia, glare,  light sensitivity, eyeglasses or contacts, blindness Ears nose mouth throat: hearing impaired, hearing aids,  ringing in ears, deaf, sign language,  vertigo,   nosebleeds,  rhinitis,  cold sores, snoring, swollen glands Cardiovascular: HTN, edema, arrhythmia, pacemaker in place, defibrillator in place, chest pain/tightness, chronic anticoagulation, blood clot, heart failure, MI Peripheral Vascular: leg cramps, varicose veins, blood clots, lymphedema, varicosities Respiratory:  difficulty breathing, denies congestion, SOB, wheezing, cough, emphysema Gastrointestinal: change in appetite or weight, abdominal pain, constipation, diarrhea, nausea, vomiting, vomiting blood, change in bowel habits, abdominal pain, jaundice, rectal bleeding, hemorrhoids, GERD Genitourinary:  nocturia,  pain on urination, polyuria,  blood in urine, Foley catheter, urinary urgency, ESRD on hemodialysis, urinary incontinence Musculoskeletal: amputation, cramping, stiff joints, painful joints, decreased joint motion, fractures, OA, gout, hemiplegia, paraplegia, uses cane, wheelchair bound, uses walker, uses rollator Skin: +changes in toenails, color change, dryness, itching, mole changes,  rash, wound(s) Neurological: headaches, numbness in feet, paresthesias in feet, burning in feet, fainting,  seizures, change in speech. denies headaches, memory problems/poor historian, cerebral palsy, weakness, paralysis, CVA, TIA Endocrine:  diabetes, hypothyroidism, hyperthyroidism,  goiter, dry mouth, flushing, heat intolerance,  cold intolerance,  excessive thirst, denies polyuria,  nocturia Hematological:  easy bleeding, excessive bleeding, easy bruising, enlarged lymph nodes, on long term blood thinner, history of past transusions Allergy/immunological:  hives, eczema, frequent infections, multiple drug allergies, seasonal allergies, transplant recipient, multiple food allergies Psychiatric:  anxiety, depression, mood disorder, suicidal ideations, hallucinations, insomnia  Objective: Vitals:   05/02/19 1330  BP: (!) 110/55  Pulse: 64    Vascular Examination: Capillary refill time <4 seconds x 10 digits.  Dorsalis pedis pulses nonpalpable b/l.   Posterior tibial pulses nonpalpable b/l.   No digital hair x 10 digits.  Skin temperature gradient WNL b/l  Dermatological Examination: Pedal skin thin, shiny and atrophic b/l.  Toenails 1-5 b/l discolored, thick, dystrophic with subungual debris and pain with palpation to nailbeds due to thickness of nails.  Hyperkeratotic lesion submet head 1 b/l. No edema, no erythema, no drainage, no flocculence.  Musculoskeletal: Muscle strength 5/5 to all LE muscle groups.  HAV with bunion b/l.  Hammertoes 2-5 b/l.  Neurological: Sensation intact with 10 gram monofilament.  Vibratory sensation intact.  Assessment: 1. Painful onychomycosis toenails 1-5 b/l  2. Calluses submet head 1 b/l 3. HAV with bunion deformity b/l 4. Hammertoes b/l 5. NIDDM with PAD  Plan: 1. Discussed diagnoses and treatment options.  Literature dispensed on today. 2. Toenails 1-5 b/l were debrided in length and girth without iatrogenic bleeding.  3. Calluses pared submetatarsal head 1 b/l utilizing sterile scalpel blade without incident. 4. Patient to continue soft, supportive shoe gear daily. 5. Patient to report any pedal injuries to medical professional immediately. 6. Follow up 3 months.   7. Patient/POA to call should there be a concern in the interim.

## 2019-05-09 ENCOUNTER — Encounter: Payer: Self-pay | Admitting: Family Medicine

## 2019-05-09 ENCOUNTER — Other Ambulatory Visit: Payer: Self-pay

## 2019-05-09 MED ORDER — CLOPIDOGREL BISULFATE 75 MG PO TABS: 75.0000 mg | ORAL_TABLET | Freq: Every day | ORAL | 1 refills | Status: DC

## 2019-05-13 NOTE — Telephone Encounter (Signed)
Spoke to dtr and informed her Dr. Curt Bears agrees w/ pt starting Eliquis.   Explained to dtr that pt's last Creatinine was 1.49 back in September which is close to lower dose Eliquis advisement.   Informed that pt will need repeat BMET prior to dosing Eliquis.  She states that pt PCP is closer and it would be more convenient to go and have done there. Made aware that I would fax order Monday to PCP office and that I would call once result received by our office.  Dtr is agreeable to plan.

## 2019-05-16 ENCOUNTER — Encounter: Payer: Self-pay | Admitting: Family Medicine

## 2019-05-18 ENCOUNTER — Telehealth: Payer: Self-pay | Admitting: Family Medicine

## 2019-05-18 DIAGNOSIS — Z5181 Encounter for therapeutic drug level monitoring: Secondary | ICD-10-CM

## 2019-05-18 NOTE — Telephone Encounter (Signed)
Please let her daughter know order is in for the lab work. I can just forward to her doctor. He is on our system.  Thanks,  Dr. Rogers Blocker

## 2019-05-19 NOTE — Telephone Encounter (Signed)
Sent MyChart message to daughter informing her.

## 2019-05-23 ENCOUNTER — Other Ambulatory Visit: Payer: Medicare Other

## 2019-05-24 ENCOUNTER — Other Ambulatory Visit: Payer: Self-pay

## 2019-05-24 ENCOUNTER — Other Ambulatory Visit (INDEPENDENT_AMBULATORY_CARE_PROVIDER_SITE_OTHER): Payer: Medicare Other

## 2019-05-24 DIAGNOSIS — Z5181 Encounter for therapeutic drug level monitoring: Secondary | ICD-10-CM | POA: Diagnosis not present

## 2019-05-24 LAB — BASIC METABOLIC PANEL
BUN: 33 mg/dL — ABNORMAL HIGH (ref 6–23)
CO2: 21 mEq/L (ref 19–32)
Calcium: 8.6 mg/dL (ref 8.4–10.5)
Chloride: 107 mEq/L (ref 96–112)
Creatinine, Ser: 1.34 mg/dL — ABNORMAL HIGH (ref 0.40–1.20)
GFR: 37.25 mL/min — ABNORMAL LOW (ref >60.00)
Glucose, Bld: 198 mg/dL — ABNORMAL HIGH (ref 70–99)
Potassium: 4.8 mEq/L (ref 3.5–5.1)
Sodium: 136 mEq/L (ref 135–145)

## 2019-06-01 ENCOUNTER — Encounter: Payer: Self-pay | Admitting: Vascular Surgery

## 2019-06-01 ENCOUNTER — Other Ambulatory Visit: Payer: Self-pay

## 2019-06-01 ENCOUNTER — Ambulatory Visit (INDEPENDENT_AMBULATORY_CARE_PROVIDER_SITE_OTHER): Payer: Medicare Other | Admitting: Vascular Surgery

## 2019-06-01 VITALS — BP 118/55 | HR 61 | Temp 98.1°F | Resp 14 | Ht 64.0 in | Wt 105.0 lb

## 2019-06-01 DIAGNOSIS — I6521 Occlusion and stenosis of right carotid artery: Secondary | ICD-10-CM

## 2019-06-01 NOTE — Progress Notes (Signed)
REASON FOR CONSULT:    Carotid stenosis.  The consult is requested by Dr. Jenkins Rouge.  ASSESSMENT & PLAN:   ASYMPTOMATIC 40 TO 59% RIGHT CAROTID STENOSIS: Based on the duplex on 04/13/2019 the patient has a 40 to 59% right carotid stenosis with no significant stenosis on the left.  She is asymptomatic.  I think the study she had in June elsewhere which suggested a greater than 70% stenosis was not accurate.  I would recommend a yearly follow-up carotid duplex scan for a stenosis in this range.  I have explained that this could be done by Dr. Weber Cooks office and certainly would be happy to see her back if the stenosis progressed to greater than 80% or she develop new neurologic symptoms.  She is on aspirin and is on a statin.  She is not a smoker.  She understands we would not consider right carotid endarterectomy less the stenosis progressed to greater than 80% or she developed right hemispheric symptoms.  Deitra Mayo, MD Office: [phone]   HPI:   Amy Wolf is a pleasant 85 y.o. female, who had a carotid duplex scan done in June in [place] which suggested a greater than 70% right carotid stenosis.  She was being considered for surgery.  She subsequently was seen by Dr. Johnsie Cancel and duplex scan in his office suggested a 64 to 59% right carotid stenosis with no significant stenosis on the left.  She was sent for vascular consultation.  The patient is right-handed.  She denies any history of stroke, TIAs, expressive or receptive aphasia, or amaurosis fugax.  She denies any problems with dizziness.  She is on aspirin, Plavix, and a statin.  Her risk factors for peripheral vascular disease include diabetes, hypertension, and hypercholesterolemia.  She denies any family history of premature cardiovascular disease or history of tobacco use.  She underwent coronary revascularization in 2007.  Past Medical History:  Diagnosis Date  . AICD (automatic cardioverter/defibrillator)  present   . Arrhythmia   . Arthritis   . Atherosclerosis of right carotid artery 04/01/2019   80% blockage. Last ultrasound in 12/2018. Saw vascular surgery in June 2020. Put on DPT in June (asa added by Psychologist, sport and exercise)  . Benign essential HTN 04/01/2019  . CAD (coronary artery disease) 04/01/2019  . Cardiac arrhythmia due to congenital heart disease   . CHF (congestive heart failure) ([place]) 04/01/2019  . Chronic kidney disease (CKD), stage III (moderate) 04/04/2019  . Diabetes ([place])   . Glaucoma   . Heart murmur   . Hx of CABG 04/01/2019   Several years ago per family   . Hyperlipemia   . Migraines   . Pacemaker   . Paroxysmal A-fib ([place]) 04/01/2019   warafin in the remote past, but changed to plavix. CHADS2 score of 8%. High fall risk...   . PVD (peripheral vascular disease) ([place]) 04/01/2019  . Type 2 diabetes mellitus with diabetic chronic kidney disease ([place]) 04/01/2019  . Urine incontinence   . Valvular heart disease 04/01/2019   Mitral and tricuspid annuloplasty     Family History  Problem Relation Age of Onset  . Cancer Father   . Cancer Sister   . Cancer Daughter     SOCIAL HISTORY: Social History   Socioeconomic History  . Marital status: Married    Spouse name: Not on file  . Number of children: Not on file  . Years of education: Not on file  . Highest education level: Not on file  Occupational History  .  Not on file  Social Needs  . Financial resource strain: Not on file  . Food insecurity    Worry: Not on file    Inability: Not on file  . Transportation needs    Medical: Not on file    Non-medical: Not on file  Tobacco Use  . Smoking status: Never Smoker  . Smokeless tobacco: Never Used  Substance and Sexual Activity  . Alcohol use: Not Currently  . Drug use: Never  . Sexual activity: Not Currently  Lifestyle  . Physical activity    Days per week: Not on file    Minutes per session: Not on file  . Stress: Not on file  Relationships  . Social Product manager on phone: Not on file    Gets together: Not on file    Attends religious service: Not on file    Active member of club or organization: Not on file    Attends meetings of clubs or organizations: Not on file    Relationship status: Not on file  . Intimate partner violence    Fear of current or ex partner: Not on file    Emotionally abused: Not on file    Physically abused: Not on file    Forced sexual activity: Not on file  Other Topics Concern  . Not on file  Social History Narrative  . Not on file    Allergies  Allergen Reactions  . Iodine Other (See Comments)  . Erythromycin Rash  . Penicillins Rash  . Sulfa Antibiotics Rash    Current Outpatient Medications  Medication Sig Dispense Refill  . acetaminophen (TYLENOL) 500 MG tablet Take 500 mg by mouth daily.    Marland Kitchen amLODipine (NORVASC) 10 MG tablet Take 10 mg by mouth daily.    Marland Kitchen aspirin 81 MG chewable tablet Chew by mouth daily.    . bimatoprost (LUMIGAN) 0.01 % SOLN Place 1 drop into both eyes at bedtime.    . clopidogrel (PLAVIX) 75 MG tablet Take 1 tablet (75 mg total) by mouth daily. 90 tablet 1  . ferrous sulfate 325 (65 FE) MG tablet Take 325 mg by mouth daily with breakfast.     . glipiZIDE (GLUCOTROL XL) 5 MG 24 hr tablet Take 1 tablet (5 mg total) by mouth daily. 90 tablet 1  . Melatonin 3 MG CAPS Take 3 mg by mouth at bedtime.     . metoprolol tartrate (LOPRESSOR) 50 MG tablet Take 1.5 tablets (75 mg total) by mouth 2 (two) times daily. 270 tablet 3  . MULTAQ 400 MG tablet Take 1 tablet (400 mg total) by mouth 2 (two) times daily with a meal. 180 tablet 1  . Netarsudil Dimesylate (RHOPRESSA) 0.02 % SOLN Apply to eye. 1 gtt both eyes q hs    . pravastatin (PRAVACHOL) 80 MG tablet Take 80 mg by mouth daily.    . ramipril (ALTACE) 2.5 MG capsule Take by mouth daily.     No current facility-administered medications for this visit.     REVIEW OF SYSTEMS:  [X]  denotes positive finding, [ ]  denotes negative  finding Cardiac  Comments:  Chest pain or chest pressure:    Shortness of breath upon exertion: x   Short of breath when lying flat:    Irregular heart rhythm: x       Vascular    Pain in calf, thigh, or hip brought on by ambulation:    Pain in feet at night that wakes  you up from your sleep:     Blood clot in your veins:    Leg swelling:         Pulmonary    Oxygen at home:    Productive cough:     Wheezing:         Neurologic    Sudden weakness in arms or legs:     Sudden numbness in arms or legs:     Sudden onset of difficulty speaking or slurred speech:    Temporary loss of vision in one eye:     Problems with dizziness:         Gastrointestinal    Blood in stool:     Vomited blood:         Genitourinary    Burning when urinating:     Blood in urine:        Psychiatric    Major depression:         Hematologic    Bleeding problems:    Problems with blood clotting too easily:        Skin    Rashes or ulcers:        Constitutional    Fever or chills:     PHYSICAL EXAM:   Vitals:   06/01/19 1417 06/01/19 1420  BP: (!) 114/57 (!) 118/55  Pulse: 60 61  Resp: 14   Temp: 98.1 F (36.7 C)   TempSrc: Temporal   SpO2: 95%   Weight: 105 lb (47.6 kg)   Height: 5\' 4"  (1.626 m)     GENERAL: The patient is a well-nourished female, in no acute distress. The vital signs are documented above. CARDIAC: There is a regular rate and rhythm.  VASCULAR: She has bilateral carotid bruits. Both feet are warm and well perfused.  I cannot palpate pedal pulses however. She has no significant lower extremity swelling. PULMONARY: There is good air exchange bilaterally without wheezing or rales. ABDOMEN: Soft and non-tender with normal pitched bowel sounds.  MUSCULOSKELETAL: There are no major deformities or cyanosis. NEUROLOGIC: No focal weakness or paresthesias are detected. SKIN: There are no ulcers or rashes noted. PSYCHIATRIC: The patient has a normal affect.  DATA:     CAROTID DUPLEX: I have reviewed the carotid duplex scan that was done on 04/13/2019.  On the right side there is a 40 to 59% carotid stenosis.  The right vertebral artery is patent with antegrade flow.  On the left side there is a less than 39% stenosis.  The left vertebral artery is patent with antegrade flow.

## 2019-06-03 ENCOUNTER — Encounter: Payer: Self-pay | Admitting: Family Medicine

## 2019-06-03 DIAGNOSIS — I6521 Occlusion and stenosis of right carotid artery: Secondary | ICD-10-CM | POA: Insufficient documentation

## 2019-06-10 MED ORDER — APIXABAN 2.5 MG PO TABS: 2.5000 mg | ORAL_TABLET | Freq: Two times a day (BID) | ORAL | 3 refills | Status: DC

## 2019-06-30 ENCOUNTER — Other Ambulatory Visit: Payer: Self-pay

## 2019-07-04 ENCOUNTER — Other Ambulatory Visit: Payer: Self-pay

## 2019-07-04 ENCOUNTER — Encounter: Payer: Self-pay | Admitting: Family Medicine

## 2019-07-04 ENCOUNTER — Ambulatory Visit (INDEPENDENT_AMBULATORY_CARE_PROVIDER_SITE_OTHER): Payer: Medicare Other | Admitting: Family Medicine

## 2019-07-04 VITALS — BP 118/65 | HR 72 | Temp 98.4°F | Ht 64.0 in | Wt 106.0 lb

## 2019-07-04 DIAGNOSIS — F5101 Primary insomnia: Secondary | ICD-10-CM | POA: Diagnosis not present

## 2019-07-04 DIAGNOSIS — E1122 Type 2 diabetes mellitus with diabetic chronic kidney disease: Secondary | ICD-10-CM

## 2019-07-04 DIAGNOSIS — E1121 Type 2 diabetes mellitus with diabetic nephropathy: Secondary | ICD-10-CM

## 2019-07-04 DIAGNOSIS — Z23 Encounter for immunization: Secondary | ICD-10-CM | POA: Diagnosis not present

## 2019-07-04 DIAGNOSIS — N1831 Chronic kidney disease, stage 3a: Secondary | ICD-10-CM

## 2019-07-04 LAB — POCT GLYCOSYLATED HEMOGLOBIN (HGB A1C): Hemoglobin A1C: 5.8 % — AB (ref 4.0–5.6)

## 2019-07-04 NOTE — Progress Notes (Signed)
Patient: Amy Wolf MRN: [id number] DOB: February 03, 1928 PCP: Orma Flaming, MD     Subjective:  Chief Complaint  Patient presents with  . Insomnia  . Diabetes    HPI: The patient is a 85 y.o. female who presents today for follow up of her diabetes and insomnia.   Diabetes: Patient is here for follow up of type 2 diabetes.  Currently on the following medications glipizide 5mg /day. Takes medications as prescribed. Last A1C was 6.3 down to 5.8 today!.   Denies any hypoglycemic events. Denies any vision changes, nausea, vomiting, abdominal pain, ulcers/paraesthesia in feet, polyuria, polydipsia or polyphagia. Denies any chest pain, shortness of breath. We decreased her glipizide down to 5mg  at last visit due to tightly controlled a1c and risk of hypoglycemia. She does not eat breakfast, but states she eats a good lunch and dinner.   Insomnia: trial of melatonin/ashwagandha. Already practicing good sleep hygiene. She is taking melatonin and does think it helps some. She gets about 4-5 hours/sleep a night. She is okay with this.    Review of Systems  Constitutional: Negative for fatigue.  Eyes: Negative for visual disturbance.  Respiratory: Negative for shortness of breath.   Cardiovascular: Negative for chest pain, palpitations and leg swelling.  Gastrointestinal: Positive for diarrhea. Negative for abdominal pain, nausea and vomiting.  Neurological: Negative for dizziness and headaches.  Psychiatric/Behavioral: Positive for sleep disturbance.    Allergies Patient is allergic to iodine; erythromycin; penicillins; and sulfa antibiotics.  Past Medical History Patient  has a past medical history of AICD (automatic cardioverter/defibrillator) present, Arrhythmia, Arthritis, Atherosclerosis of right carotid artery (04/01/2019), Benign essential HTN (04/01/2019), CAD (coronary artery disease) (04/01/2019), Cardiac arrhythmia due to congenital heart disease, CHF (congestive heart failure) ([place])  (04/01/2019), Chronic kidney disease (CKD), stage III (moderate) (04/04/2019), Diabetes ([place]), Glaucoma, Heart murmur, CABG (04/01/2019), Hyperlipemia, Migraines, Pacemaker, Paroxysmal A-fib ([place]) (04/01/2019), PVD (peripheral vascular disease) ([place]) (04/01/2019), Type 2 diabetes mellitus with diabetic chronic kidney disease ([place]) (04/01/2019), Urine incontinence, and Valvular heart disease (04/01/2019).  Surgical History Patient  has a past surgical history that includes Abdominal hysterectomy; Back surgery (1980); Hip fracture surgery (2015); Coronary artery bypass graft; Cardiac defibrillator placement; and PPM GENERATOR CHANGEOUT.  Family History Pateint's family history includes Cancer in her daughter, father, and sister.  Social History Patient  reports that she has never smoked. She has never used smokeless tobacco. She reports previous alcohol use. She reports that she does not use drugs.    Objective: Vitals:   07/04/19 1102  BP: 118/65  Pulse: 72  Temp: 98.4 F (36.9 C)  TempSrc: Skin  SpO2: 96%  Weight: 48.1 kg  Height: 5\' 4"  (1.626 m)    Body mass index is 18.19 kg/m.  Physical Exam Vitals reviewed.  Constitutional:      Appearance: Normal appearance.     Comments: thin  HENT:     Head: Normocephalic and atraumatic.  Cardiovascular:     Rate and Rhythm: Normal rate and regular rhythm.     Heart sounds: Murmur present.  Pulmonary:     Effort: Pulmonary effort is normal.     Breath sounds: Normal breath sounds.  Abdominal:     General: Abdomen is flat. Bowel sounds are normal.     Palpations: Abdomen is soft.  Musculoskeletal:     Cervical back: Normal range of motion and neck supple.  Neurological:     General: No focal deficit present.     Mental Status: She is alert and oriented to  person, place, and time.  Psychiatric:        Mood and Affect: Mood normal.        Behavior: Behavior normal.        Assessment/plan: 1. Type 2 diabetes mellitus with stage 3a  chronic kidney disease, without long-term current use of insulin (HCC) a1c improved on even lower dose of the glipizide. Extremely tightly controlled for her age. Stopping glipizide all together. Will continue with diet controlled diabetes. Will see her back in 6 months for follow up. Did recommend small meals throughout day nd to try and eats something in the AM as she normally skips breakfast.  - POCT glycosylated hemoglobin (Hb A1C)  2. Primary insomnia Likes the melatonin. Does not want any other medication at this time.   3. Need for prophylactic vaccination against Streptococcus pneumoniae (pneumococcus)  - Pneumococcal conjugate vaccine 13-valent    This visit occurred during the SARS-CoV-2 public health emergency.  Safety protocols were in place, including screening questions prior to the visit, additional usage of staff PPE, and extensive cleaning of exam room while observing appropriate contact time as indicated for disinfecting solutions.    Return in about 6 months (around 01/02/2020) for diabetes/blood pressure/kidneys .  Orma Flaming, MD [place]  07/04/2019

## 2019-07-22 ENCOUNTER — Ambulatory Visit (INDEPENDENT_AMBULATORY_CARE_PROVIDER_SITE_OTHER): Payer: PPO | Admitting: *Deleted

## 2019-07-22 DIAGNOSIS — Z95810 Presence of automatic (implantable) cardiac defibrillator: Secondary | ICD-10-CM

## 2019-07-23 LAB — CUP PACEART REMOTE DEVICE CHECK
Battery Remaining Longevity: 24 mo
Battery Voltage: 2.92 V
Brady Statistic AP VP Percent: 66.83 %
Brady Statistic AP VS Percent: 2.44 %
Brady Statistic AS VP Percent: 24.92 %
Brady Statistic AS VS Percent: 5.8 %
Brady Statistic RA Percent Paced: 66.55 %
Brady Statistic RV Percent Paced: 91.78 %
Date Time Interrogation Session: 20210115152945
HighPow Impedance: 50 Ohm
Implantable Lead Location: 753862
Implantable Lead Special Function: 2015
Lead Channel Impedance Value: 304 Ohm
Lead Channel Impedance Value: 342 Ohm
Lead Channel Impedance Value: 456 Ohm
Lead Channel Pacing Threshold Amplitude: 0.625 V
Lead Channel Pacing Threshold Amplitude: 1 V
Lead Channel Pacing Threshold Pulse Width: 0.4 ms
Lead Channel Pacing Threshold Pulse Width: 0.4 ms
Lead Channel Sensing Intrinsic Amplitude: 1 mV
Lead Channel Sensing Intrinsic Amplitude: 1 mV
Lead Channel Sensing Intrinsic Amplitude: 7 mV
Lead Channel Sensing Intrinsic Amplitude: 7 mV
Lead Channel Setting Pacing Amplitude: 2 V
Lead Channel Setting Pacing Amplitude: 2 V
Lead Channel Setting Pacing Pulse Width: 0.4 ms
Lead Channel Setting Sensing Sensitivity: 0.3 mV

## 2019-08-01 ENCOUNTER — Encounter: Payer: Self-pay | Admitting: Family Medicine

## 2019-08-01 NOTE — Telephone Encounter (Signed)
Dr. Jerline Pain, please review message and advise due to Dr. Rogers Blocker out. I do not see where Dr. Rogers Blocker prescribed Amlodipine.

## 2019-08-02 MED ORDER — AMLODIPINE BESYLATE 10 MG PO TABS: 10.0000 mg | ORAL_TABLET | Freq: Every day | ORAL | 0 refills | Status: DC

## 2019-08-02 NOTE — Telephone Encounter (Signed)
Spoke to [place], told her Rx for Amlodipine was sent to pharmacy. Dr. Julieta Bellini refill since Dr. Rogers Blocker is on vacation this week. Also in regards to allergies Dr. Jerline Pain said to make an appt with Dr. Rogers Blocker. [name] verbalized understanding. Rx sent.

## 2019-08-05 ENCOUNTER — Ambulatory Visit (INDEPENDENT_AMBULATORY_CARE_PROVIDER_SITE_OTHER): Payer: PPO | Admitting: Podiatry

## 2019-08-05 DIAGNOSIS — M79674 Pain in right toe(s): Secondary | ICD-10-CM | POA: Diagnosis not present

## 2019-08-05 DIAGNOSIS — B351 Tinea unguium: Secondary | ICD-10-CM

## 2019-08-05 DIAGNOSIS — E1151 Type 2 diabetes mellitus with diabetic peripheral angiopathy without gangrene: Secondary | ICD-10-CM | POA: Diagnosis not present

## 2019-08-05 DIAGNOSIS — M19071 Primary osteoarthritis, right ankle and foot: Secondary | ICD-10-CM | POA: Diagnosis not present

## 2019-08-05 DIAGNOSIS — M79675 Pain in left toe(s): Secondary | ICD-10-CM

## 2019-08-05 NOTE — Patient Instructions (Signed)

## 2019-08-08 ENCOUNTER — Encounter: Payer: Self-pay | Admitting: Podiatry

## 2019-08-08 NOTE — Progress Notes (Signed)
  Subjective:  Patient ID: Amy Wolf, female    DOB: October 10, 1927,  MRN: [id number]  Chief Complaint  Patient presents with  . Nail Problem    bilateral thick painful toenails, 3 month follow up  . Callouses    painful callus lesions  . PVD  . Coronary Artery Disease    patient is taking Eliquis (plavix?)  . Chronic Kidney Disease   86 y.o. female returns for the above complaint.  Patient presents with painful elongated thickened mycotic toenails x10.  Patient states that they hurt when they are ambulating.  Patient is known to my other patient Amy Wolf's.  She also has a secondary complaint of right first metatarsophalangeal joint arthritis and pain associated with it.  She states this has been going on for years and has gradually gotten worse.  It is limiting her from ambulating.  She denies any other acute complaints.  She would like to know if there is anything that could be done for this.  Objective:  There were no vitals filed for this visit. Podiatric Exam: Vascular: dorsalis pedis and posterior tibial pulses are palpable bilateral. Capillary return is immediate. Temperature gradient is WNL. Skin turgor WNL  Sensorium: Normal Semmes Weinstein monofilament test. Normal tactile sensation bilaterally. Nail Exam: Pt has thick disfigured discolored nails with subungual debris noted bilateral entire nail hallux through fifth toenails Ulcer Exam: There is no evidence of ulcer or pre-ulcerative changes or infection. Orthopedic Exam: Muscle tone and strength are WNL. No limitations in general ROM. No crepitus or effusions noted. HAV  B/L.  Hammer toes 2-5  B/L.  Limited range of motion noted of the right first MPJ.  There is crepitus noted.  Pain on palpation to the right medial aspect of the first metatarsophalangeal joint Skin: No Porokeratosis. No infection or ulcers  Assessment & Plan:  Patient was evaluated and treated and all questions answered.  Right first metatarsophalangeal  joint arthritis/capsulitis -I explained to the patient the etiology of arthritis/capsulitis and various treatment options associated with it.  I believe patient will benefit from a steroid injection to help decrease the acute inflammatory component of the arthritic joint.  Patient agrees with the plan would like to proceed with injection. -A steroid injection was performed at right first MPJ using 1% plain Lidocaine and 10 mg of Kenalog. This was well tolerated.   Onychomycosis with pain  -Nails palliatively debrided as below. -Educated on self-care  Procedure: Nail Debridement Rationale: pain  Type of Debridement: manual, sharp debridement. Instrumentation: Nail nipper, rotary burr. Number of Nails: 10  Procedures and Treatment: Consent by patient was obtained for treatment procedures. The patient understood the discussion of treatment and procedures well. All questions were answered thoroughly reviewed. Debridement of mycotic and hypertrophic toenails, 1 through 5 bilateral and clearing of subungual debris. No ulceration, no infection noted.  Return Visit-Office Procedure: Patient instructed to return to the office for a follow up visit 3 months for continued evaluation and treatment.  Boneta Lucks, DPM    Return in about 3 months (around 11/03/2019) for nail and callus trim/ Eliquis.

## 2019-08-21 ENCOUNTER — Ambulatory Visit: Payer: PPO | Attending: Internal Medicine

## 2019-08-21 DIAGNOSIS — Z23 Encounter for immunization: Secondary | ICD-10-CM | POA: Insufficient documentation

## 2019-08-21 NOTE — Progress Notes (Signed)
,    Covid-19 Vaccination Clinic  Name:  Amy Wolf    MRN: [id number] DOB: 1927-10-31  08/21/2019  [name] was observed post Covid-19 immunization for 15 minutes without incidence. She was provided with Vaccine Information Sheet and instruction to access the V-Safe system.   [name] was instructed to call 911 with any severe reactions post vaccine: Marland Kitchen Difficulty breathing  . Swelling of your face and throat  . A fast heartbeat  . A bad rash all over your body  . Dizziness and weakness    Immunizations Administered    Name Date Dose VIS Date Route   Pfizer COVID-19 Vaccine 08/21/2019  2:03 PM 0.3 mL 06/17/2019 Intramuscular   Manufacturer: [place]   Lot: [id number]   [place]: [id number]

## 2019-08-24 DIAGNOSIS — H401133 Primary open-angle glaucoma, bilateral, severe stage: Secondary | ICD-10-CM | POA: Diagnosis not present

## 2019-09-13 ENCOUNTER — Ambulatory Visit: Payer: PPO | Attending: Internal Medicine

## 2019-09-13 DIAGNOSIS — Z23 Encounter for immunization: Secondary | ICD-10-CM | POA: Insufficient documentation

## 2019-09-13 NOTE — Progress Notes (Signed)
   Covid-19 Vaccination Clinic  Name:  Amy Wolf    MRN: [id number] DOB: 1928-04-14  09/13/2019  [name] was observed post Covid-19 immunization for 15 minutes without incident. She was provided with Vaccine Information Sheet and instruction to access the V-Safe system.   [name] was instructed to call 911 with any severe reactions post vaccine: Marland Kitchen Difficulty breathing  . Swelling of face and throat  . A fast heartbeat  . A bad rash all over body  . Dizziness and weakness   Immunizations Administered    Name Date Dose VIS Date Route   Pfizer COVID-19 Vaccine 09/13/2019  2:02 PM 0.3 mL 06/17/2019 Intramuscular   Manufacturer: [place]   Lot: [id number]   [place]: [id number]

## 2019-09-14 ENCOUNTER — Ambulatory Visit: Payer: PPO

## 2019-09-17 ENCOUNTER — Other Ambulatory Visit: Payer: Self-pay | Admitting: Family Medicine

## 2019-09-18 ENCOUNTER — Other Ambulatory Visit: Payer: Self-pay | Admitting: Family Medicine

## 2019-09-19 ENCOUNTER — Other Ambulatory Visit: Payer: Self-pay

## 2019-09-22 ENCOUNTER — Other Ambulatory Visit: Payer: Self-pay

## 2019-09-22 ENCOUNTER — Ambulatory Visit (INDEPENDENT_AMBULATORY_CARE_PROVIDER_SITE_OTHER): Payer: PPO

## 2019-09-22 DIAGNOSIS — Z0000 Encounter for general adult medical examination without abnormal findings: Secondary | ICD-10-CM | POA: Diagnosis not present

## 2019-09-22 NOTE — Patient Instructions (Signed)
Amy Wolf , Thank you for taking time to come for your Medicare Wellness Visit. I appreciate your ongoing commitment to your health goals. Please review the following plan we discussed and let me know if I can assist you in the future.   Screening recommendations/referrals: Colorectal Screening: No longer indicated  Mammogram: No longer indicated  Bone Density: No longer indicated   Vision and Dental Exams: Recommended annual ophthalmology exams for early detection of glaucoma and other disorders of the eye Recommended annual dental exams for proper oral hygiene  Diabetic Exams: Diabetic Eye Exam: recommended yearly  Diabetic Foot Exam: recommended yearly; up to date   Vaccinations: Influenza vaccine: completed 04/01/19 Pneumococcal vaccine: up to date; last 07/04/19 Tdap vaccine: recommended; Please call your insurance company to determine your out of pocket expense. You may receive this vaccine at your local pharmacy or Health Dept. Shingles vaccine: You may receive this vaccine at your local pharmacy. (see handout)   Advanced directives: We have received a copy of your POA (Power of [place]) and/or Living Will. These documents can be located in your chart.  Goals: Recommend to drink at least 6-8 8oz glasses of water per day and consume a balanced diet rich in fresh fruits and vegetables.   Next appointment: Please schedule your Annual Wellness Visit with your Nurse Health Advisor in one year.  Preventive Care 36 Years and Older, Female Preventive care refers to lifestyle choices and visits with your health care provider that can promote health and wellness. What does preventive care include?  A yearly physical exam. This is also called an annual well check.  Dental exams once or twice a year.  Routine eye exams. Ask your health care provider how often you should have your eyes checked.  Personal lifestyle choices, including:  Daily care of your teeth and gums.  Regular  physical activity.  Eating a healthy diet.  Avoiding tobacco and drug use.  Limiting alcohol use.  Practicing safe sex.  Taking low-dose aspirin every day if recommended by your health care provider.  Taking vitamin and mineral supplements as recommended by your health care provider. What happens during an annual well check? The services and screenings done by your health care provider during your annual well check will depend on your age, overall health, lifestyle risk factors, and family history of disease. Counseling  Your health care provider may ask you questions about your:  Alcohol use.  Tobacco use.  Drug use.  Emotional well-being.  Home and relationship well-being.  Sexual activity.  Eating habits.  History of falls.  Memory and ability to understand (cognition).  Work and work Statistician.  Reproductive health. Screening  You may have the following tests or measurements:  Height, weight, and BMI.  Blood pressure.  Lipid and cholesterol levels. These may be checked every 5 years, or more frequently if you are over 53 years old.  Skin check.  Lung cancer screening. You may have this screening every year starting at age 72 if you have a 30-pack-year history of smoking and currently smoke or have quit within the past 15 years.  Fecal occult blood test (FOBT) of the stool. You may have this test every year starting at age 49.  Flexible sigmoidoscopy or colonoscopy. You may have a sigmoidoscopy every 5 years or a colonoscopy every 10 years starting at age 42.  Hepatitis C blood test.  Hepatitis B blood test.  Sexually transmitted disease (STD) testing.  Diabetes screening. This is done by checking  your blood sugar (glucose) after you have not eaten for a while (fasting). You may have this done every 1-3 years.  Bone density scan. This is done to screen for osteoporosis. You may have this done starting at age 92.  Mammogram. This may be done every  1-2 years. Talk to your health care provider about how often you should have regular mammograms. Talk with your health care provider about your test results, treatment options, and if necessary, the need for more tests. Vaccines  Your health care provider may recommend certain vaccines, such as:  Influenza vaccine. This is recommended every year.  Tetanus, diphtheria, and acellular pertussis (Tdap, Td) vaccine. You may need a Td booster every 10 years.  Zoster vaccine. You may need this after age 44.  Pneumococcal 13-valent conjugate (PCV13) vaccine. One dose is recommended after age 59.  Pneumococcal polysaccharide (PPSV23) vaccine. One dose is recommended after age 45. Talk to your health care provider about which screenings and vaccines you need and how often you need them. This information is not intended to replace advice given to you by your health care provider. Make sure you discuss any questions you have with your health care provider. Document Released: 07/20/2015 Document Revised: 03/12/2016 Document Reviewed: 04/24/2015 Elsevier Interactive Patient Education  2017 [place] Prevention in the Home Falls can cause injuries. They can happen to people of all ages. There are many things you can do to make your home safe and to help prevent falls. What can I do on the outside of my home?  Regularly fix the edges of walkways and driveways and fix any cracks.  Remove anything that might make you trip as you walk through a door, such as a raised step or threshold.  Trim any bushes or trees on the path to your home.  Use bright outdoor lighting.  Clear any walking paths of anything that might make someone trip, such as rocks or tools.  Regularly check to see if handrails are loose or broken. Make sure that both sides of any steps have handrails.  Any raised decks and porches should have guardrails on the edges.  Have any leaves, snow, or ice cleared regularly.  Use  sand or salt on walking paths during winter.  Clean up any spills in your garage right away. This includes oil or grease spills. What can I do in the bathroom?  Use night lights.  Install grab bars by the toilet and in the tub and shower. Do not use towel bars as grab bars.  Use non-skid mats or decals in the tub or shower.  If you need to sit down in the shower, use a plastic, non-slip stool.  Keep the floor dry. Clean up any water that spills on the floor as soon as it happens.  Remove soap buildup in the tub or shower regularly.  Attach bath mats securely with double-sided non-slip rug tape.  Do not have throw rugs and other things on the floor that can make you trip. What can I do in the bedroom?  Use night lights.  Make sure that you have a light by your bed that is easy to reach.  Do not use any sheets or blankets that are too big for your bed. They should not hang down onto the floor.  Have a firm chair that has side arms. You can use this for support while you get dressed.  Do not have throw rugs and other things on the floor  that can make you trip. What can I do in the kitchen?  Clean up any spills right away.  Avoid walking on wet floors.  Keep items that you use a lot in easy-to-reach places.  If you need to reach something above you, use a strong step stool that has a grab bar.  Keep electrical cords out of the way.  Do not use floor polish or wax that makes floors slippery. If you must use wax, use non-skid floor wax.  Do not have throw rugs and other things on the floor that can make you trip. What can I do with my stairs?  Do not leave any items on the stairs.  Make sure that there are handrails on both sides of the stairs and use them. Fix handrails that are broken or loose. Make sure that handrails are as long as the stairways.  Check any carpeting to make sure that it is firmly attached to the stairs. Fix any carpet that is loose or worn.  Avoid  having throw rugs at the top or bottom of the stairs. If you do have throw rugs, attach them to the floor with carpet tape.  Make sure that you have a light switch at the top of the stairs and the bottom of the stairs. If you do not have them, ask someone to add them for you. What else can I do to help prevent falls?  Wear shoes that:  Do not have high heels.  Have rubber bottoms.  Are comfortable and fit you well.  Are closed at the toe. Do not wear sandals.  If you use a stepladder:  Make sure that it is fully opened. Do not climb a closed stepladder.  Make sure that both sides of the stepladder are locked into place.  Ask someone to hold it for you, if possible.  Clearly mark and make sure that you can see:  Any grab bars or handrails.  First and last steps.  Where the edge of each step is.  Use tools that help you move around (mobility aids) if they are needed. These include:  Canes.  Walkers.  Scooters.  Crutches.  Turn on the lights when you go into a dark area. Replace any light bulbs as soon as they burn out.  Set up your furniture so you have a clear path. Avoid moving your furniture around.  If any of your floors are uneven, fix them.  If there are any pets around you, be aware of where they are.  Review your medicines with your doctor. Some medicines can make you feel dizzy. This can increase your chance of falling. Ask your doctor what other things that you can do to help prevent falls. This information is not intended to replace advice given to you by your health care provider. Make sure you discuss any questions you have with your health care provider. Document Released: 04/19/2009 Document Revised: 11/29/2015 Document Reviewed: 07/28/2014 Elsevier Interactive Patient Education  2017 [employer].

## 2019-09-22 NOTE — Progress Notes (Signed)
This visit is being conducted via phone call due to the COVID-19 pandemic. This patient has given me verbal consent via phone to conduct this visit, patient states they are participating from their home address. Some vital signs may be absent or patient reported.   Patient identification: identified by name, DOB, and current address.  Location provider: [facility], Office Persons participating in the virtual visit: Denman George LPN, patient, and Dr. Orma Flaming   Subjective:   Amy Wolf is a 86 y.o. female who presents for Medicare Annual (Subsequent) preventive examination.  Review of Systems:   Cardiac Risk Factors include: advanced age (>11men, >32 women);hypertension;dyslipidemia    Objective:     Vitals: There were no vitals taken for this visit.  There is no height or weight on file to calculate BMI.  Advanced Directives 09/22/2019  Does Patient Have a Medical Advance Directive? Yes  Type of Advance Directive Living will;Healthcare Power of Attorney  Does patient want to make changes to medical advance directive? No - Patient declined  Copy of [place] in Chart? Yes - validated most recent copy scanned in chart (See row information)    Tobacco Social History   Tobacco Use  Smoking Status Never Smoker  Smokeless Tobacco Never Used     Counseling given: Not Answered   Clinical Intake:  Pre-visit preparation completed: Yes  Pain : No/denies pain  Diabetes: Yes CBG done?: No Did pt. bring in CBG monitor from home?: No  How often do you need to have someone help you when you read instructions, pamphlets, or other written materials from your doctor or pharmacy?: 1 - Never  Interpreter Needed?: No  Information entered by :: Denman George LPN  Past Medical History:  Diagnosis Date  . AICD (automatic cardioverter/defibrillator) present   . Arrhythmia   . Arthritis   . Atherosclerosis of right carotid artery 04/01/2019   80% blockage.  Last ultrasound in 12/2018. Saw vascular surgery in June 2020. Put on DPT in June (asa added by Psychologist, sport and exercise)  . Benign essential HTN 04/01/2019  . CAD (coronary artery disease) 04/01/2019  . Cardiac arrhythmia due to congenital heart disease   . CHF (congestive heart failure) ([place]) 04/01/2019  . Chronic kidney disease (CKD), stage III (moderate) 04/04/2019  . Diabetes ([place])   . Glaucoma   . Heart murmur   . Hx of CABG 04/01/2019   Several years ago per family   . Hyperlipemia   . Migraines   . Pacemaker   . Paroxysmal A-fib ([place]) 04/01/2019   warafin in the remote past, but changed to plavix. CHADS2 score of 8%. High fall risk...   . PVD (peripheral vascular disease) ([place]) 04/01/2019  . Type 2 diabetes mellitus with diabetic chronic kidney disease ([place]) 04/01/2019  . Urine incontinence   . Valvular heart disease 04/01/2019   Mitral and tricuspid annuloplasty    Past Surgical History:  Procedure Laterality Date  . ABDOMINAL HYSTERECTOMY    . BACK SURGERY  1980  . CARDIAC DEFIBRILLATOR PLACEMENT    . CORONARY ARTERY BYPASS GRAFT    . HIP FRACTURE SURGERY  2015  . PPM GENERATOR CHANGEOUT     Family History  Problem Relation Age of Onset  . Cancer Father   . Cancer Sister   . Cancer Daughter    Social History   Socioeconomic History  . Marital status: Married    Spouse name: Not on file  . Number of children: 2  . Years  of education: Not on file  . Highest education level: Not on file  Occupational History  . Occupation: Retired   Tobacco Use  . Smoking status: Never Smoker  . Smokeless tobacco: Never Used  Substance and Sexual Activity  . Alcohol use: Not Currently  . Drug use: Never  . Sexual activity: Not Currently  Other Topics Concern  . Not on file  Social History Narrative   Lives with spouse   Does not drive    Daughter bring meals and transports   [employer] on wheels    Social Determinants of Health   Financial Resource Strain:   . Difficulty of Paying  Living Expenses:   Food Insecurity:   . Worried About Charity fundraiser in the Last Year:   . Arboriculturist in the Last Year:   Transportation Needs:   . Film/video editor (Medical):   Marland Kitchen Lack of Transportation (Non-Medical):   Physical Activity:   . Days of Exercise per Week:   . Minutes of Exercise per Session:   Stress:   . Feeling of Stress :   Social Connections:   . Frequency of Communication with Friends and Family:   . Frequency of Social Gatherings with Friends and Family:   . Attends Religious Services:   . Active Member of Clubs or Organizations:   . Attends Archivist Meetings:   Marland Kitchen Marital Status:     Outpatient Encounter Medications as of 09/22/2019  Medication Sig  . acetaminophen (TYLENOL) 500 MG tablet Take 500 mg by mouth daily.  Marland Kitchen amLODipine (NORVASC) 10 MG tablet TAKE 1 TABLET BY MOUTH EVERY DAY  . apixaban (ELIQUIS) 2.5 MG TABS tablet Take 1 tablet (2.5 mg total) by mouth 2 (two) times daily.  Marland Kitchen aspirin 81 MG chewable tablet Chew by mouth daily.  . bimatoprost (LUMIGAN) 0.01 % SOLN Place 1 drop into both eyes at bedtime.  . clopidogrel (PLAVIX) 75 MG tablet TAKE 1 TABLET BY MOUTH EVERY DAY  . ferrous sulfate 325 (65 FE) MG tablet Take 325 mg by mouth daily with breakfast.   . Melatonin 3 MG CAPS Take 3 mg by mouth at bedtime.   . metoprolol tartrate (LOPRESSOR) 50 MG tablet Take 1.5 tablets (75 mg total) by mouth 2 (two) times daily.  . MULTAQ 400 MG tablet TAKE 1 TABLET (400 MG TOTAL) BY MOUTH 2 TIMES DAILY WITH A MEAL.  Marland Kitchen Netarsudil Dimesylate (RHOPRESSA) 0.02 % SOLN Apply to eye. 1 gtt both eyes q hs  . pravastatin (PRAVACHOL) 80 MG tablet Take 80 mg by mouth daily.  . ramipril (ALTACE) 2.5 MG capsule Take by mouth daily.   No facility-administered encounter medications on file as of 09/22/2019.    Activities of Daily Living In your present state of health, do you have any difficulty performing the following activities: 09/22/2019    Hearing? N  Vision? N  Difficulty concentrating or making decisions? N  Walking or climbing stairs? N  Dressing or bathing? N  Doing errands, shopping? N  Preparing Food and eating ? N  Using the Toilet? N  In the past six months, have you accidently leaked urine? N  Do you have problems with loss of bowel control? N  Managing your Medications? N  Managing your Finances? N  Housekeeping or managing your Housekeeping? N  Some recent data might be hidden    Patient Care Team: Orma Flaming, MD as PCP - General (Family Medicine) Vivian, Will [place],  MD as PCP - Electrophysiology (Cardiology) Katy Apo, MD as Consulting Physician (Ophthalmology) Josue Hector, MD as Consulting Physician (Cardiology) Felipa Furnace, DPM as Consulting Physician (Podiatry)    Assessment:   This is a routine wellness examination for Tesa.  Exercise Activities and Dietary recommendations Current Exercise Habits: Home exercise routine, Type of exercise: walking, Time (Minutes): 30, Frequency (Times/Week): 4, Weekly Exercise (Minutes/Week): 120, Intensity: Mild  Goals   None     Fall Risk Fall Risk  09/22/2019  Falls in the past year? 0  Number falls in past yr: 0  Injury with Fall? 0  Follow up Falls evaluation completed;Education provided;Falls prevention discussed   Is the patient's home free of loose throw rugs in walkways, pet beds, electrical cords, etc?   yes      Grab bars in the bathroom? yes      Handrails on the stairs?   yes      Adequate lighting?   yes   Depression Screen PHQ 2/9 Scores 09/22/2019 04/01/2019  PHQ - 2 Score 0 3  PHQ- 9 Score - 9     Cognitive Function     6CIT Screen 09/22/2019  What Year? 0 points  What month? 0 points  What time? 0 points  Count back from 20 0 points  Months in reverse 0 points  Repeat phrase 0 points  Total Score 0    Immunization History  Administered Date(s) Administered  . Fluad Quad(high Dose 65+) 04/01/2019  . PFIZER  SARS-COV-2 Vaccination 08/21/2019, 09/13/2019  . Pneumococcal Conjugate-13 07/04/2019    Qualifies for Shingles Vaccine?Discussed and patient will check with pharmacy for coverage.  Patient education handout provided   Screening Tests Health Maintenance  Topic Date Due  . OPHTHALMOLOGY EXAM  Never done  . TETANUS/TDAP  Never done  . DEXA SCAN  Never done  . HEMOGLOBIN A1C  01/02/2020  . FOOT EXAM  05/01/2020  . PNA vac Low Risk Adult (2 of 2 - PPSV23) 07/03/2020  . INFLUENZA VACCINE  Completed    Cancer Screenings: Lung: Low Dose CT Chest recommended if Age 36-80 years, 30 pack-year currently smoking OR have quit w/in 15years. Patient does not qualify. Breast:  Up to date on Mammogram? Yes   Up to date of Bone Density/Dexa? Yes Colorectal: No longer indicated     Plan:  I have personally reviewed and addressed the Medicare Annual Wellness questionnaire and have noted the following in the patient's chart:  A. Medical and social history B. Use of alcohol, tobacco or illicit drugs  C. Current medications and supplements D. Functional ability and status E.  Nutritional status F.  Physical activity G. Advance directives H. List of other physicians I.  Hospitalizations, surgeries, and ER visits in previous 12 months J.  [place] such as hearing and vision if needed, cognitive and depression L. Referrals, records requested, and appointments- none   In addition, I have reviewed and discussed with patient certain preventive protocols, quality metrics, and best practice recommendations. A written personalized care plan for preventive services as well as general preventive health recommendations were provided to patient.   Signed,  Denman George, LPN  Nurse Health Advisor   Nurse Notes: no additional

## 2019-09-25 ENCOUNTER — Other Ambulatory Visit: Payer: Self-pay | Admitting: Family Medicine

## 2019-09-30 ENCOUNTER — Encounter: Payer: Self-pay | Admitting: Family Medicine

## 2019-09-30 NOTE — Progress Notes (Signed)
CARDIOLOGY CONSULT NOTE       Patient IDLady Wolf MRN: [id number] DOB/AGE: [id number] 84 y.o.  Admit date: (Not on file) Referring Physician: Self recent move to area Primary Physician: Amy Flaming, MD Primary Cardiologist: New Reason for Consultation: CAD/PAF  Active Problems:   * No active hospital problems. *   HPI:  86 y.o. with HTN, DM, CAD, anemia and carotid disease. History of PAF. Currently not on anticoagulation ? Due to anemia and fall risk with age and GI bleed . CHADVASC 8.  Seen by new local primary Amy Wolf 04/01/19 Carotid report from [facility] [id number] reviewed LICA 0000000 and RICA 70-99% velocities on right PSV 2.93 m/sec EDV 0.5 m/sec She has had a previous right sided CVA   Records review is limited. CAD with AICD and previous mitral and tricuspid valve surgery with CABG  She has had GI bleed last year. She has very thin skin and is covered in bruises In NSR today with AV pacing No angina Daughter thinks she is a fall risk and won't use a walker No TIA symptoms. No CHF or angina Compliant with meds Indicates surgery was done at [facility] in 2015    Seen by Dr Curt Bears 05/03/19 AICD working well ? Episodes of 2:1 atrial tachycardia CHADVASC 8 She was placed on Multaq and low dose eliquis Baseline Cr around 1.3-1.5   TTE 04/12/19 known stable EF 30% moderate MR/TR Carotid 04/13/19 40-59% RICA stenosis   Has had COVID vaccine Husband is 20 and not very active at home She is able to ambulate with a walker around the house   ROS All other systems reviewed and negative except as noted above  Past Medical History:  Diagnosis Date  . AICD (automatic cardioverter/defibrillator) present   . Arrhythmia   . Arthritis   . Atherosclerosis of right carotid artery 04/01/2019   80% blockage. Last ultrasound in 12/2018. Saw vascular surgery in June 2020. Put on DPT in June (asa added by Psychologist, sport and exercise)  . Benign essential HTN 04/01/2019  . CAD  (coronary artery disease) 04/01/2019  . Cardiac arrhythmia due to congenital heart disease   . CHF (congestive heart failure) ([place]) 04/01/2019  . Chronic kidney disease (CKD), stage III (moderate) 04/04/2019  . Diabetes ([place])   . Glaucoma   . Heart murmur   . Hx of CABG 04/01/2019   Several years ago per family   . Hyperlipemia   . Migraines   . Pacemaker   . Paroxysmal A-fib ([place]) 04/01/2019   warafin in the remote past, but changed to plavix. CHADS2 score of 8%. High fall risk...   . PVD (peripheral vascular disease) ([place]) 04/01/2019  . Type 2 diabetes mellitus with diabetic chronic kidney disease ([place]) 04/01/2019  . Urine incontinence   . Valvular heart disease 04/01/2019   Mitral and tricuspid annuloplasty     Family History  Problem Relation Age of Onset  . Cancer Father   . Cancer Sister   . Cancer Daughter     Social History   Socioeconomic History  . Marital status: Married    Spouse name: Not on file  . Number of children: 2  . Years of education: Not on file  . Highest education level: Not on file  Occupational History  . Occupation: Retired   Tobacco Use  . Smoking status: Never Smoker  . Smokeless tobacco: Never Used  Substance and Sexual Activity  . Alcohol use: Not Currently  . Drug use:  Never  . Sexual activity: Not Currently  Other Topics Concern  . Not on file  Social History Narrative   Lives with spouse   Does not drive    Daughter bring meals and transports   [employer] on wheels    Social Determinants of Health   Financial Resource Strain:   . Difficulty of Paying Living Expenses:   Food Insecurity:   . Worried About Charity fundraiser in the Last Year:   . Arboriculturist in the Last Year:   Transportation Needs:   . Film/video editor (Medical):   Marland Kitchen Lack of Transportation (Non-Medical):   Physical Activity:   . Days of Exercise per Week:   . Minutes of Exercise per Session:   Stress:   . Feeling of Stress :   Social  Connections:   . Frequency of Communication with Friends and Family:   . Frequency of Social Gatherings with Friends and Family:   . Attends Religious Services:   . Active Member of Clubs or Organizations:   . Attends Archivist Meetings:   Marland Kitchen Marital Status:   Intimate Partner Violence:   . Fear of Current or Ex-Partner:   . Emotionally Abused:   Marland Kitchen Physically Abused:   . Sexually Abused:     Past Surgical History:  Procedure Laterality Date  . ABDOMINAL HYSTERECTOMY    . BACK SURGERY  1980  . CARDIAC DEFIBRILLATOR PLACEMENT    . CORONARY ARTERY BYPASS GRAFT    . HIP FRACTURE SURGERY  2015  . PPM GENERATOR CHANGEOUT          Physical Exam: Blood pressure 120/68, pulse 74, height 5\' 4"  (1.626 m), weight 102 lb (46.3 kg), SpO2 98 %.   Affect appropriate Frail elderly female  HEENT: normal Neck supple with no adenopathy JVP elevated with V wave  bilateral bruits no thyromegaly Lungs clear with no wheezing and good diaphragmatic motion Heart:  S1/S2 MR  murmur, no rub, gallop or click PMI enlarged AICD under left clavicle  Abdomen: benighn, BS positve, no tenderness, no AAA no bruit.  No HSM or HJR Distal pulses intact with no bruits No edema Neuro non-focal Skin warm and dry No muscular weakness   Labs:   Lab Results  Component Value Date   WBC 7.9 04/01/2019   HGB 12.0 04/01/2019   HCT 36.2 04/01/2019   MCV 91.3 04/01/2019   PLT 266.0 04/01/2019    No results for input(s): NA, K, CL, CO2, BUN, CREATININE, CALCIUM, PROT, BILITOT, ALKPHOS, ALT, AST, GLUCOSE in the last 168 hours.  Invalid input(s): LABALBU No results found for: CKTOTAL, CKMB, CKMBINDEX, TROPONINI  Lab Results  Component Value Date   CHOL 170 04/01/2019   Lab Results  Component Value Date   HDL 63.80 04/01/2019   Lab Results  Component Value Date   LDLCALC 89 04/01/2019   Lab Results  Component Value Date   TRIG 87.0 04/01/2019   Lab Results  Component Value Date    CHOLHDL 3 04/01/2019   No results found for: LDLDIRECT    Radiology: No results found.  EKG: AV pacing no old ECG to compare    ASSESSMENT AND PLAN:   1. CAD/CABG:  no angina continue medical Rx 2.  Valve Dx:  History of TV/MV annuloplasty moderate residual MR and TR follow TTE 04/12/19  3. PV Dx:  Carotid disease on our duplex 04/13/19 only 40-59% RICA stenosis  4. AICD/PPM:  Normal function  f/u [place] 5. Anticoagulation per Dr Curt Bears started on low dose eliquis follow BMET  6. HLD:  Continue statin  7. Anemia:  Hct 36.2 historically stable labs 04/01/19 continue iron replacement ferritin normal  8. DM: Discussed low carb diet.  Target hemoglobin A1c is 6.5 or less.  Continue current medications. A1c reasonable at 6.3  9.  HTN:  On ACE good control   Tests Ordered:  Carotid duplex  October 2021    F/u with EP in 6 months  F/U with me in a year  Signed: Jenkins Rouge 10/06/2019, 10:19 AM

## 2019-10-06 ENCOUNTER — Ambulatory Visit (INDEPENDENT_AMBULATORY_CARE_PROVIDER_SITE_OTHER): Payer: PPO | Admitting: Cardiovascular Disease

## 2019-10-06 ENCOUNTER — Encounter: Payer: Self-pay | Admitting: Cardiovascular Disease

## 2019-10-06 ENCOUNTER — Other Ambulatory Visit: Payer: Self-pay

## 2019-10-06 VITALS — BP 120/68 | HR 74 | Ht 64.0 in | Wt 102.0 lb

## 2019-10-06 DIAGNOSIS — R0989 Other specified symptoms and signs involving the circulatory and respiratory systems: Secondary | ICD-10-CM

## 2019-10-06 DIAGNOSIS — I480 Paroxysmal atrial fibrillation: Secondary | ICD-10-CM | POA: Diagnosis not present

## 2019-10-06 DIAGNOSIS — I340 Nonrheumatic mitral (valve) insufficiency: Secondary | ICD-10-CM

## 2019-10-06 NOTE — Patient Instructions (Addendum)
Medication Instructions:  *If you need a refill on your cardiac medications before your next appointment, please call your pharmacy*  Lab Work: If you have labs (blood work) drawn today and your tests are completely normal, you will receive your results only by: Marland Kitchen MyChart Message (if you have MyChart) OR . A paper copy in the mail If you have any lab test that is abnormal or we need to change your treatment, we will call you to review the results.  Testing/Procedures: Your physician has requested that you have a carotid duplex in October. This test is an ultrasound of the carotid arteries in your neck. It looks at blood flow through these arteries that supply the brain with blood. Allow one hour for this exam. There are no restrictions or special instructions.  Follow-Up: At [facility], you and your health needs are our priority.  As part of our continuing mission to provide you with exceptional heart care, we have created designated Provider Care Teams.  These Care Teams include your primary Cardiologist (physician) and Advanced Practice Providers (APPs -  Physician Assistants and Nurse Practitioners) who all work together to provide you with the care you need, when you need it.  We recommend signing up for the patient portal called "MyChart".  Sign up information is provided on this After Visit Summary.  MyChart is used to connect with patients for Virtual Visits (Telemedicine).  Patients are able to view lab/test results, encounter notes, upcoming appointments, etc.  Non-urgent messages can be sent to your provider as well.   To learn more about what you can do with MyChart, go to [email or web address].    Your next appointment:   12 month(s)  The format for your next appointment:   In Person  Provider:   You may see Dr. Johnsie Cancel or one of the following Advanced Practice Providers on your designated Care Team:    Truitt Merle, NP  Cecilie Kicks, NP  Kathyrn Drown, NP

## 2019-10-21 ENCOUNTER — Ambulatory Visit (INDEPENDENT_AMBULATORY_CARE_PROVIDER_SITE_OTHER): Payer: PPO | Admitting: *Deleted

## 2019-10-21 DIAGNOSIS — Z95810 Presence of automatic (implantable) cardiac defibrillator: Secondary | ICD-10-CM | POA: Diagnosis not present

## 2019-10-21 LAB — CUP PACEART REMOTE DEVICE CHECK
Battery Remaining Longevity: 26 mo
Battery Voltage: 2.94 V
Brady Statistic AP VP Percent: 41.8 %
Brady Statistic AP VS Percent: 1.74 %
Brady Statistic AS VP Percent: 17.88 %
Brady Statistic AS VS Percent: 38.58 %
Brady Statistic RA Percent Paced: 43.31 %
Brady Statistic RV Percent Paced: 59.74 %
Date Time Interrogation Session: 20210416012503
HighPow Impedance: 52 Ohm
Implantable Lead Location: 753862
Implantable Lead Special Function: 2015
Lead Channel Impedance Value: 304 Ohm
Lead Channel Impedance Value: 361 Ohm
Lead Channel Impedance Value: 475 Ohm
Lead Channel Pacing Threshold Amplitude: 0.625 V
Lead Channel Pacing Threshold Amplitude: 1 V
Lead Channel Pacing Threshold Pulse Width: 0.4 ms
Lead Channel Pacing Threshold Pulse Width: 0.4 ms
Lead Channel Sensing Intrinsic Amplitude: 1.25 mV
Lead Channel Sensing Intrinsic Amplitude: 1.25 mV
Lead Channel Sensing Intrinsic Amplitude: 7.5 mV
Lead Channel Sensing Intrinsic Amplitude: 7.5 mV
Lead Channel Setting Pacing Amplitude: 2 V
Lead Channel Setting Pacing Amplitude: 2.25 V
Lead Channel Setting Pacing Pulse Width: 0.4 ms
Lead Channel Setting Sensing Sensitivity: 0.3 mV

## 2019-10-21 NOTE — Progress Notes (Signed)
ICD Remote  

## 2019-10-31 ENCOUNTER — Other Ambulatory Visit: Payer: Self-pay | Admitting: Cardiology

## 2019-10-31 NOTE — Telephone Encounter (Signed)
Prescription refill request for Eliquis received.  Last office visit: 10/06/2019, Nishan Scr: 1.34, 05/24/2019 Age: 86 y.o. Weight: 46.3 kg   Prescription refill sent for Eliquis 2.5 mg BID.

## 2019-11-04 ENCOUNTER — Ambulatory Visit: Payer: PPO | Admitting: Podiatry

## 2019-11-04 ENCOUNTER — Encounter: Payer: Self-pay | Admitting: Podiatry

## 2019-11-04 ENCOUNTER — Other Ambulatory Visit: Payer: Self-pay

## 2019-11-04 DIAGNOSIS — B351 Tinea unguium: Secondary | ICD-10-CM | POA: Diagnosis not present

## 2019-11-04 DIAGNOSIS — M79674 Pain in right toe(s): Secondary | ICD-10-CM

## 2019-11-04 DIAGNOSIS — M79675 Pain in left toe(s): Secondary | ICD-10-CM | POA: Diagnosis not present

## 2019-11-04 DIAGNOSIS — M21619 Bunion of unspecified foot: Secondary | ICD-10-CM | POA: Diagnosis not present

## 2019-11-04 DIAGNOSIS — E1151 Type 2 diabetes mellitus with diabetic peripheral angiopathy without gangrene: Secondary | ICD-10-CM

## 2019-11-04 NOTE — Progress Notes (Signed)
  Subjective:  Patient ID: Amy Wolf, female    DOB: March 25, 1928,  MRN: [id number]  Chief Complaint  Patient presents with  . Nail Problem    pt is here for a nail trim  . Callouses    the callus needs a trim but hurt the last time i was here   86 y.o. female returns for the above complaint.  Patient presents with painful elongated thickened mycotic toenails x10.  Patient states that they hurt when they are ambulating.  Patient is known to my other patient Trilby Drummer Holford's.  Patient also has a secondary complaint of right bunion pain.  Patient states that she would like to discuss any conservative management.  She just wants to know if there is anything that could be done to help with the bunion pain.  Patient states it does hurt her once in a while randomly when she is walking or ambulating.  She has not done anything to help and a protective bunion.  She denies any other acute complaints.  Objective:  There were no vitals filed for this visit. Podiatric Exam: Vascular: dorsalis pedis and posterior tibial pulses are palpable bilateral. Capillary return is immediate. Temperature gradient is WNL. Skin turgor WNL  Sensorium: Normal Semmes Weinstein monofilament test. Normal tactile sensation bilaterally. Nail Exam: Pt has thick disfigured discolored nails with subungual debris noted bilateral entire nail hallux through fifth toenails Ulcer Exam: There is no evidence of ulcer or pre-ulcerative changes or infection. Orthopedic Exam: Muscle tone and strength are WNL. No limitations in general ROM. No crepitus or effusions noted. HAV  B/L.  Hammer toes 2-5  B/L.  Limited range of motion noted of the right first MPJ.  There is crepitus noted.  No pain on palpation to the right medial aspect of the first metatarsophalangeal joint.  Bunion deformity noted to the bilateral lower extremity with the right worse than left.  It appears to be track bound not tracking deformity. Skin: No Porokeratosis. No infection  or ulcers  Assessment & Plan:  Patient was evaluated and treated and all questions answered.  Right first metatarsophalangeal joint arthritis/capsulitis -Resolved with a steroid injection.  No further injections are warranted as the pain has resolved.  If it continues to get worse or returns we will consider doing orthotics management with Morton's extension.  Right hallux abductor valgus deformity -I explained to the patient the etiology of bunion deformity resulting into the underlying arthritic changes over the course of decades.  I discussed with the patient various treatment options mostly conservative in nature.  Patient states she will obtain wider shoe gear modification as well as offload the bunion with padding.  At this time patient is not an ideal candidate for surgery given her age as well as other risk factors.   Onychomycosis with pain  -Nails palliatively debrided as below. -Educated on self-care  Procedure: Nail Debridement Rationale: pain  Type of Debridement: manual, sharp debridement. Instrumentation: Nail nipper, rotary burr. Number of Nails: 10  Procedures and Treatment: Consent by patient was obtained for treatment procedures. The patient understood the discussion of treatment and procedures well. All questions were answered thoroughly reviewed. Debridement of mycotic and hypertrophic toenails, 1 through 5 bilateral and clearing of subungual debris. No ulceration, no infection noted.  Return Visit-Office Procedure: Patient instructed to return to the office for a follow up visit 3 months for continued evaluation and treatment.  Boneta Lucks, DPM    No follow-ups on file.

## 2019-11-08 ENCOUNTER — Encounter: Payer: Self-pay | Admitting: Podiatry

## 2020-01-04 ENCOUNTER — Encounter: Payer: Self-pay | Admitting: Family Medicine

## 2020-01-04 ENCOUNTER — Other Ambulatory Visit: Payer: Self-pay

## 2020-01-04 ENCOUNTER — Ambulatory Visit (INDEPENDENT_AMBULATORY_CARE_PROVIDER_SITE_OTHER): Payer: PPO | Admitting: Family Medicine

## 2020-01-04 VITALS — BP 80/60 | HR 62 | Temp 98.2°F | Ht 64.0 in | Wt 104.2 lb

## 2020-01-04 DIAGNOSIS — E1121 Type 2 diabetes mellitus with diabetic nephropathy: Secondary | ICD-10-CM | POA: Diagnosis not present

## 2020-01-04 DIAGNOSIS — I480 Paroxysmal atrial fibrillation: Secondary | ICD-10-CM | POA: Diagnosis not present

## 2020-01-04 DIAGNOSIS — N1831 Chronic kidney disease, stage 3a: Secondary | ICD-10-CM

## 2020-01-04 DIAGNOSIS — I10 Essential (primary) hypertension: Secondary | ICD-10-CM | POA: Diagnosis not present

## 2020-01-04 DIAGNOSIS — N183 Chronic kidney disease, stage 3 unspecified: Secondary | ICD-10-CM | POA: Diagnosis not present

## 2020-01-04 LAB — CBC WITH DIFFERENTIAL/PLATELET
Basophils Absolute: 0 10*3/uL (ref 0.0–0.1)
Basophils Relative: 0.5 % (ref 0.0–3.0)
Eosinophils Absolute: 0.2 10*3/uL (ref 0.0–0.7)
Eosinophils Relative: 2.5 % (ref 0.0–5.0)
HCT: 30.4 % — ABNORMAL LOW (ref 36.0–46.0)
Hemoglobin: 10.3 g/dL — ABNORMAL LOW (ref 12.0–15.0)
Lymphocytes Relative: 22.9 % (ref 12.0–46.0)
Lymphs Abs: 1.5 10*3/uL (ref 0.7–4.0)
MCHC: 33.9 g/dL (ref 30.0–36.0)
MCV: 94.5 fl (ref 78.0–100.0)
Monocytes Absolute: 0.5 10*3/uL (ref 0.1–1.0)
Monocytes Relative: 7.2 % (ref 3.0–12.0)
Neutro Abs: 4.3 10*3/uL (ref 1.4–7.7)
Neutrophils Relative %: 66.9 % (ref 43.0–77.0)
Platelets: 246 10*3/uL (ref 150.0–400.0)
RBC: 3.22 Mil/uL — ABNORMAL LOW (ref 3.87–5.11)
RDW: 13.9 % (ref 11.5–15.5)
WBC: 6.4 10*3/uL (ref 4.0–10.5)

## 2020-01-04 LAB — HEMOGLOBIN A1C: Hgb A1c MFr Bld: 7.1 % — ABNORMAL HIGH (ref 4.6–6.5)

## 2020-01-04 MED ORDER — AMLODIPINE BESYLATE 5 MG PO TABS
5.0000 mg | ORAL_TABLET | Freq: Every day | ORAL | 3 refills | Status: DC
Start: 2020-01-04 — End: 2020-04-26

## 2020-01-04 NOTE — Patient Instructions (Signed)
-  your blood pressure is quite low today. Since you are not symptomatic I do not want to do too much. I have decreased your norvasc down to 5mg /day. Sent in new prescription for you.   -checking diabetes labs and seeing where your a1c is at.   -continue all other medication

## 2020-01-04 NOTE — Progress Notes (Signed)
Patient: Amy Wolf MRN: [id number] DOB: 02-Jun-1928 PCP: Orma Flaming, MD     Subjective:  Chief Complaint  Patient presents with  . Hypertension  . Diabetes  . Chronic Kidney Disease    HPI: The patient is a 86 y.o. female who presents today for routine 6 months visit for DM, HTN, and CKD.   Diabetes Patient is here for follow up of type 2 diabetes.  Currently on the following medications: none. I stopped her glipizide at her last visit. Last A1C was 5.8.  Denies any hypoglycemic events. Denies any vision changes, nausea, vomiting, abdominal pain, ulcers/paraesthesia in feet, polyuria, polydipsia or polyphagia. Denies any chest pain, shortness of breath. She is trying to eat more meals throughout the day. Still not eating breakfast.   Insomnia She added on melatonin and states she is doing a little better. She is still getting about 4-5 hours/night.   Hypertension: Here for follow up of hypertension.  Currently on amlodipine 10mg , lopressor 75mg  BID and ramipril 2.5mg  daily. .  Takes medication as prescribed and denies any side effects. Exercise includes none. Weight has been stable. Denies any chest pain, headaches, shortness of breath, vision changes, swelling in lower extremities. Her blood pressure is quite low in ofice x 2 readings, but asymptomatic.   CKD -due for recheck. Baseline around 1.3-1.5.    Review of Systems  Respiratory: Negative for shortness of breath and wheezing.   Cardiovascular: Negative for chest pain and palpitations.  Gastrointestinal: Negative for abdominal pain, nausea and vomiting.  Genitourinary: Negative for frequency, pelvic pain and urgency.  Neurological: Negative for dizziness, light-headedness and headaches.    Allergies Patient is allergic to iodine, erythromycin, penicillins, and sulfa antibiotics.  Past Medical History Patient  has a past medical history of AICD (automatic cardioverter/defibrillator) present, Arrhythmia, Arthritis,  Atherosclerosis of right carotid artery (04/01/2019), Benign essential HTN (04/01/2019), CAD (coronary artery disease) (04/01/2019), Cardiac arrhythmia due to congenital heart disease, CHF (congestive heart failure) ([place]) (04/01/2019), Chronic kidney disease (CKD), stage III (moderate) (04/04/2019), Diabetes ([place]), Glaucoma, Heart murmur, CABG (04/01/2019), Hyperlipemia, Migraines, Pacemaker, Paroxysmal A-fib ([place]) (04/01/2019), PVD (peripheral vascular disease) ([place]) (04/01/2019), Type 2 diabetes mellitus with diabetic chronic kidney disease ([place]) (04/01/2019), Urine incontinence, and Valvular heart disease (04/01/2019).  Surgical History Patient  has a past surgical history that includes Abdominal hysterectomy; Back surgery (1980); Hip fracture surgery (2015); Coronary artery bypass graft; Cardiac defibrillator placement; and PPM GENERATOR CHANGEOUT.  Family History Pateint's family history includes Cancer in her daughter, father, and sister.  Social History Patient  reports that she has never smoked. She has never used smokeless tobacco. She reports previous alcohol use. She reports that she does not use drugs.    Objective: Vitals:   01/04/20 1322 01/04/20 1350  BP: (!) 80/50 (!) 80/60  Pulse: 62   Temp: 98.2 F (36.8 C)   TempSrc: Temporal   SpO2: 97%   Weight: 104 lb 3.2 oz (47.3 kg)   Height: 5\' 4"  (1.626 m)     Body mass index is 17.89 kg/m.  Physical Exam Vitals reviewed.  Constitutional:      Appearance: Normal appearance.  HENT:     Head: Normocephalic and atraumatic.  Cardiovascular:     Rate and Rhythm: Normal rate and regular rhythm.     Heart sounds: Murmur heard.   Pulmonary:     Effort: Pulmonary effort is normal.     Breath sounds: Normal breath sounds.  Abdominal:     General: Abdomen is flat.  Bowel sounds are normal.     Palpations: Abdomen is soft.  Musculoskeletal:     Right lower leg: No edema.     Left lower leg: No edema.  Neurological:     General: No  focal deficit present.     Mental Status: She is alert and oriented to person, place, and time.        Assessment/plan: 1. Type 2 diabetes mellitus with stage 3a chronic kidney disease, without long-term current use of insulin ([place]) Stopped her glipizide at her last appointment and we will see how she is doing. a1c was 5.8. does not need to be tightly controlled at age of 16 years and risk of hypoglycemia is high with sulfonylureas. F/u in 3-6 months.  - Hemoglobin A1c  2. Benign essential HTN Low x 2, but asymptomatic. Will decrease her norvasc down to 5mg  and have them keep a log. Want her blood pressure low with her CHF and EF of 30%, but this is much lower than she usually runs. Routine labs today.  - CBC with Differential/Platelet - Comprehensive metabolic panel  3. Stage 3 chronic kidney disease, unspecified whether stage 3a or 3b CKD Routine labs. Euvolemic.   -of note cardiology added on eliquis for her afib and she is now on DPT+eliquis. She is a high fall risk. Will reach out to vascular and see if they want to continue plavix as well as cardiology.   This visit occurred during the SARS-CoV-2 public health emergency.  Safety protocols were in place, including screening questions prior to the visit, additional usage of staff PPE, and extensive cleaning of exam room while observing appropriate contact time as indicated for disinfecting solutions.     Return in about 6 months (around 07/05/2020) for routine check up: diabetes/ckd/htn.    Orma Flaming, MD [place]   01/04/2020

## 2020-01-05 ENCOUNTER — Other Ambulatory Visit: Payer: Self-pay | Admitting: Family Medicine

## 2020-01-05 LAB — COMPREHENSIVE METABOLIC PANEL
ALT: 12 U/L (ref 0–35)
AST: 14 U/L (ref 0–37)
Albumin: 4 g/dL (ref 3.5–5.2)
Alkaline Phosphatase: 45 U/L (ref 39–117)
BUN: 26 mg/dL — ABNORMAL HIGH (ref 6–23)
CO2: 23 mEq/L (ref 19–32)
Calcium: 8.9 mg/dL (ref 8.4–10.5)
Chloride: 106 mEq/L (ref 96–112)
Creatinine, Ser: 1.37 mg/dL — ABNORMAL HIGH (ref 0.40–1.20)
GFR: 36.26 mL/min — ABNORMAL LOW (ref >60.00)
Glucose, Bld: 258 mg/dL — ABNORMAL HIGH (ref 70–99)
Potassium: 4.5 mEq/L (ref 3.5–5.1)
Sodium: 136 mEq/L (ref 135–145)
Total Bilirubin: 0.4 mg/dL (ref 0.2–1.2)
Total Protein: 6.4 g/dL (ref 6.0–8.3)

## 2020-01-20 ENCOUNTER — Ambulatory Visit (INDEPENDENT_AMBULATORY_CARE_PROVIDER_SITE_OTHER): Payer: PPO | Admitting: *Deleted

## 2020-01-20 ENCOUNTER — Telehealth: Payer: Self-pay

## 2020-01-20 DIAGNOSIS — I4819 Other persistent atrial fibrillation: Secondary | ICD-10-CM

## 2020-01-20 LAB — CUP PACEART REMOTE DEVICE CHECK
Battery Remaining Longevity: 24 mo
Battery Voltage: 2.91 V
Brady Statistic AP VP Percent: 16.57 %
Brady Statistic AP VS Percent: 2.49 %
Brady Statistic AS VP Percent: 44.48 %
Brady Statistic AS VS Percent: 36.47 %
Brady Statistic RA Percent Paced: 18.61 %
Brady Statistic RV Percent Paced: 60.88 %
Date Time Interrogation Session: 20210716063525
HighPow Impedance: 56 Ohm
Implantable Lead Location: 753862
Implantable Lead Special Function: 2015
Lead Channel Impedance Value: 304 Ohm
Lead Channel Impedance Value: 361 Ohm
Lead Channel Impedance Value: 418 Ohm
Lead Channel Pacing Threshold Amplitude: 0.5 V
Lead Channel Pacing Threshold Amplitude: 0.875 V
Lead Channel Pacing Threshold Pulse Width: 0.4 ms
Lead Channel Pacing Threshold Pulse Width: 0.4 ms
Lead Channel Sensing Intrinsic Amplitude: 0.875 mV
Lead Channel Sensing Intrinsic Amplitude: 0.875 mV
Lead Channel Sensing Intrinsic Amplitude: 8.375 mV
Lead Channel Sensing Intrinsic Amplitude: 8.375 mV
Lead Channel Setting Pacing Amplitude: 2 V
Lead Channel Setting Pacing Amplitude: 2 V
Lead Channel Setting Pacing Pulse Width: 0.4 ms
Lead Channel Setting Sensing Sensitivity: 0.3 mV

## 2020-01-20 NOTE — Telephone Encounter (Signed)
Scheduled remote transmission received- Ongoing AF >6days.  Pt has known history of PAF on Eliquis for [place].  Current AF burden 46.3%.  There does appear to be an increase in AF over recent months.   Patient medications include Metoprolol 75mg  BID.  Multaq 400mg  BID.  Eliquis 2.5mg  BID.    Spoke with pt daughter, she indicates pt ahs no c/o any current cardiac symptoms.  Pt has appt with Adele Barthel, PA in AF clinic on 7/19.  Advised  Of ED precautions for emergent symptoms.

## 2020-01-23 ENCOUNTER — Other Ambulatory Visit: Payer: Self-pay

## 2020-01-23 ENCOUNTER — Ambulatory Visit (HOSPITAL_COMMUNITY)
Admission: RE | Admit: 2020-01-23 | Discharge: 2020-01-23 | Disposition: A | Discharge status: Home / Self Care | Payer: PPO | Source: Ambulatory Visit | Attending: Physician Assistant | Admitting: Physician Assistant

## 2020-01-23 ENCOUNTER — Encounter (HOSPITAL_COMMUNITY): Payer: Self-pay | Admitting: Physician Assistant

## 2020-01-23 VITALS — BP 132/60 | HR 66 | Ht 64.0 in | Wt 102.2 lb

## 2020-01-23 DIAGNOSIS — Z79899 Other long term (current) drug therapy: Secondary | ICD-10-CM | POA: Insufficient documentation

## 2020-01-23 DIAGNOSIS — I2510 Atherosclerotic heart disease of native coronary artery without angina pectoris: Secondary | ICD-10-CM | POA: Diagnosis not present

## 2020-01-23 DIAGNOSIS — Z95810 Presence of automatic (implantable) cardiac defibrillator: Secondary | ICD-10-CM | POA: Diagnosis not present

## 2020-01-23 DIAGNOSIS — Z888 Allergy status to other drugs, medicaments and biological substances status: Secondary | ICD-10-CM | POA: Insufficient documentation

## 2020-01-23 DIAGNOSIS — E1151 Type 2 diabetes mellitus with diabetic peripheral angiopathy without gangrene: Secondary | ICD-10-CM | POA: Diagnosis not present

## 2020-01-23 DIAGNOSIS — M1990 Unspecified osteoarthritis, unspecified site: Secondary | ICD-10-CM | POA: Diagnosis not present

## 2020-01-23 DIAGNOSIS — I4819 Other persistent atrial fibrillation: Secondary | ICD-10-CM | POA: Diagnosis not present

## 2020-01-23 DIAGNOSIS — I38 Endocarditis, valve unspecified: Secondary | ICD-10-CM | POA: Diagnosis not present

## 2020-01-23 DIAGNOSIS — Z7901 Long term (current) use of anticoagulants: Secondary | ICD-10-CM | POA: Diagnosis not present

## 2020-01-23 DIAGNOSIS — E785 Hyperlipidemia, unspecified: Secondary | ICD-10-CM | POA: Insufficient documentation

## 2020-01-23 DIAGNOSIS — Z7902 Long term (current) use of antithrombotics/antiplatelets: Secondary | ICD-10-CM | POA: Insufficient documentation

## 2020-01-23 DIAGNOSIS — I130 Hypertensive heart and chronic kidney disease with heart failure and stage 1 through stage 4 chronic kidney disease, or unspecified chronic kidney disease: Secondary | ICD-10-CM | POA: Insufficient documentation

## 2020-01-23 DIAGNOSIS — I5022 Chronic systolic (congestive) heart failure: Secondary | ICD-10-CM | POA: Insufficient documentation

## 2020-01-23 DIAGNOSIS — E1122 Type 2 diabetes mellitus with diabetic chronic kidney disease: Secondary | ICD-10-CM | POA: Insufficient documentation

## 2020-01-23 DIAGNOSIS — Z882 Allergy status to sulfonamides status: Secondary | ICD-10-CM | POA: Diagnosis not present

## 2020-01-23 DIAGNOSIS — D649 Anemia, unspecified: Secondary | ICD-10-CM | POA: Diagnosis not present

## 2020-01-23 DIAGNOSIS — N183 Chronic kidney disease, stage 3 unspecified: Secondary | ICD-10-CM | POA: Diagnosis not present

## 2020-01-23 DIAGNOSIS — Z880 Allergy status to penicillin: Secondary | ICD-10-CM | POA: Diagnosis not present

## 2020-01-23 DIAGNOSIS — Z881 Allergy status to other antibiotic agents status: Secondary | ICD-10-CM | POA: Insufficient documentation

## 2020-01-23 DIAGNOSIS — E1136 Type 2 diabetes mellitus with diabetic cataract: Secondary | ICD-10-CM | POA: Insufficient documentation

## 2020-01-23 DIAGNOSIS — Z951 Presence of aortocoronary bypass graft: Secondary | ICD-10-CM | POA: Diagnosis not present

## 2020-01-23 DIAGNOSIS — D6869 Other thrombophilia: Secondary | ICD-10-CM | POA: Diagnosis not present

## 2020-01-23 NOTE — Progress Notes (Signed)
Primary Care Physician: Orma Flaming, MD Primary Cardiologist: Dr Johnsie Cancel Primary Electrophysiologist: Dr Curt Bears Referring Physician: Dr Vicenta Aly is a 86 y.o. female with a history of hypertension, diabetes, coronary artery disease s/p CABG, anemia, carotid artery disease, chronic systolic CHF s/p ICD, valvular heart disease s/p mitral and tricuspid annuloplasty, and paroxysmal atrial fibrillation who presents for follow up in the [facility]. She did have a GI bleed in 2019 and anticoagulation was deferred at that time with low afib burden. She has a CHADS2VASC score of 7. She was seen by Dr Curt Bears on 05/03/19 and Eliquis was recommended at that time. The device clinic received an alert for increasing afib burden at 46%. Patient denies any awareness of her arrhythmia with no palpitations, increased fatigue, or SOB. She denies any bleeding on anticoagulation.   Today, she denies symptoms of palpitations, chest pain, shortness of breath, orthopnea, PND, lower extremity edema, dizziness, presyncope, syncope, snoring, daytime somnolence, bleeding, or neurologic sequela. The patient is tolerating medications without difficulties and is otherwise without complaint today.    Atrial Fibrillation Risk Factors:  she does not have symptoms or diagnosis of sleep apnea. she does not have a history of rheumatic fever.   she has a BMI of Body mass index is 17.54 kg/m.Marland Kitchen Filed Weights   01/23/20 1328  Weight: 46.4 kg    Family History  Problem Relation Age of Onset   Cancer Father    Cancer Sister    Cancer Daughter      Atrial Fibrillation Management history:  Previous antiarrhythmic drugs: Multaq Previous cardioversions: none Previous ablations: none CHADS2VASC score: 7 Anticoagulation history: Eliquis   Past Medical History:  Diagnosis Date   AICD (automatic cardioverter/defibrillator) present    Arrhythmia    Arthritis     Atherosclerosis of right carotid artery 04/01/2019   80% blockage. Last ultrasound in 12/2018. Saw vascular surgery in June 2020. Put on DPT in June (asa added by surgeon)   Benign essential HTN 04/01/2019   CAD (coronary artery disease) 04/01/2019   Cardiac arrhythmia due to congenital heart disease    CHF (congestive heart failure) ([place]) 04/01/2019   Chronic kidney disease (CKD), stage III (moderate) 04/04/2019   Diabetes ([place])    Glaucoma    Heart murmur    Hx of CABG 04/01/2019   Several years ago per family    Hyperlipemia    Migraines    Pacemaker    Paroxysmal A-fib ([place]) 04/01/2019   warafin in the remote past, but changed to plavix. CHADS2 score of 8%. High fall risk...    PVD (peripheral vascular disease) ([place]) 04/01/2019   Type 2 diabetes mellitus with diabetic chronic kidney disease ([place]) 04/01/2019   Urine incontinence    Valvular heart disease 04/01/2019   Mitral and tricuspid annuloplasty    Past Surgical History:  Procedure Laterality Date   ABDOMINAL HYSTERECTOMY     BACK SURGERY  1980   CARDIAC DEFIBRILLATOR PLACEMENT     CORONARY ARTERY BYPASS GRAFT     HIP FRACTURE SURGERY  2015   PPM GENERATOR CHANGEOUT      Current Outpatient Medications  Medication Sig Dispense Refill   acetaminophen (TYLENOL) 500 MG tablet Take 500 mg by mouth daily.     amLODipine (NORVASC) 5 MG tablet Take 1 tablet (5 mg total) by mouth daily. 90 tablet 3   bimatoprost (LUMIGAN) 0.01 % SOLN Place 1 drop into both eyes at bedtime.  clopidogrel (PLAVIX) 75 MG tablet TAKE 1 TABLET BY MOUTH EVERY DAY 90 tablet 0   ELIQUIS 2.5 MG TABS tablet TAKE 1 TABLET BY MOUTH TWICE A DAY 60 tablet 5   ferrous sulfate 325 (65 FE) MG tablet Take 325 mg by mouth daily with breakfast.      Melatonin 3 MG CAPS Take 3 mg by mouth at bedtime.      metoprolol tartrate (LOPRESSOR) 50 MG tablet Take 1.5 tablets (75 mg total) by mouth 2 (two) times daily. 270 tablet 3   MULTAQ 400 MG  tablet TAKE 1 TABLET (400 MG TOTAL) BY MOUTH 2 TIMES DAILY WITH A MEAL. 60 tablet 5   Netarsudil Dimesylate (RHOPRESSA) 0.02 % SOLN Apply to eye. 1 gtt both eyes q hs     pravastatin (PRAVACHOL) 80 MG tablet Take 80 mg by mouth daily.     ramipril (ALTACE) 2.5 MG capsule Take by mouth daily.     No current facility-administered medications for this encounter.    Allergies  Allergen Reactions   Iodine Other (See Comments)   Erythromycin Rash   Penicillins Rash   Sulfa Antibiotics Rash    Social History   Socioeconomic History   Marital status: Married    Spouse name: Not on file   Number of children: 2   Years of education: Not on file   Highest education level: Not on file  Occupational History   Occupation: Retired   Tobacco Use   Smoking status: Never Smoker   Smokeless tobacco: Never Used  Scientific laboratory technician Use: Never used  Substance and Sexual Activity   Alcohol use: Not Currently   Drug use: Never   Sexual activity: Not Currently  Other Topics Concern   Not on file  Social History Narrative   Lives with spouse   Does not drive    Daughter bring meals and transports   [employer] on wheels    Social Determinants of Health   Financial Resource Strain:    Difficulty of Paying Living Expenses:   Food Insecurity:    Worried About Charity fundraiser in the Last Year:    Arboriculturist in the Last Year:   Transportation Needs:    Film/video editor (Medical):    Lack of Transportation (Non-Medical):   Physical Activity:    Days of Exercise per Week:    Minutes of Exercise per Session:   Stress:    Feeling of Stress :   Social Connections:    Frequency of Communication with Friends and Family:    Frequency of Social Gatherings with Friends and Family:    Attends Religious Services:    Active Member of Clubs or Organizations:    Attends Music therapist:    Marital Status:   Intimate Partner  Violence:    Fear of Current or Ex-Partner:    Emotionally Abused:    Physically Abused:    Sexually Abused:      ROS- All systems are reviewed and negative except as per the HPI above.  Physical Exam: Vitals:   01/23/20 1328 01/23/20 1335  BP: 104/60 132/60  Pulse: 66   Weight: 46.4 kg   Height: 5\' 4"  (1.626 m)     GEN- The patient is well appearing elderly female, alert and oriented x 3 today.   Head- normocephalic, atraumatic Eyes-  Sclera clear, conjunctiva pink Ears- hearing intact Oropharynx- clear Neck- supple  Lungs- Clear to ausculation bilaterally,  normal work of breathing Heart- irregular rate and rhythm, no murmurs, rubs or gallops  GI- soft, NT, ND, + BS Extremities- no clubbing, cyanosis, or edema MS- no significant deformity or atrophy Skin- no rash or lesion Psych- euthymic mood, full affect Neuro- strength and sensation are intact  Wt Readings from Last 3 Encounters:  01/23/20 46.4 kg  01/04/20 47.3 kg  10/06/19 46.3 kg    EKG today demonstrates afib with intermittent V pacing HR 66, QRS 150, QTc 484  Echo 04/12/19 demonstrated  1. Left ventricular ejection fraction, by visual estimation, is 30%. The  left ventricle has normal function. Normal left ventricular size. There is  mildly increased left ventricular hypertrophy. Septal-lateral dyssynchrony  with diffuse hypokinesis.  2. Left ventricular diastolic Doppler parameters are indeterminate  pattern of LV diastolic filling.  3. S/p tricuspid valve repair. No more than moderate tricuspid  regurgitation visually though thick CW doppler jet raises concern for more significant tricuspid regurgitation.  4. S/p mitral valve repair. No more than moderate mitral regurgitation  visually, though thick CW doppler jet raises concern for more significant  mitral regurgitation. Mean gradient 3 mmHg across the mitral valve, no  evidence for significant stenosis.  5. The aortic valve is tricuspid  Aortic valve regurgitation is trivial by  color flow Doppler. Mild aortic valve sclerosis without stenosis.  6. Global right ventricle has mildly reduced systolic function.The right  ventricular size is mildly enlarged. No increase in right ventricular wall  thickness.  7. A pacer wire is visualized in the RV.  8. The tricuspid valve is normal in structure. Tricuspid valve  regurgitation is mild.  9. Right atrial size was moderately dilated.  10. Left atrial size was severely dilated.  11. The inferior vena cava is dilated in size with >50% respiratory  variability, suggesting right atrial pressure of 8 mmHg.  12. The tricuspid regurgitant velocity is 3.35 m/s, and with an assumed  right atrial pressure of 8 mmHg, the estimated right ventricular systolic  pressure is moderately elevated at 52.9 mmHg.   Epic records are reviewed at length today  CHA2DS2-VASc Score = 7  The patient's score is based upon: CHF History: 1 HTN History: 1 Age : 2 Diabetes History: 1 Stroke History: 0 Vascular Disease History: 1 Gender: 1      ASSESSMENT AND PLAN: 1. Persistent atrial fibrillation  The patient's CHA2DS2-VASc score is 7, indicating a 11.2% annual risk of stroke.   Increased afib burden seen on device.  We discussed therapeutic options today including changing AAD vs rate control. Discussed with family and Dr Curt Bears. Will stop Multaq today and pursue rate control given her age, comorbidities, and paucity of symptoms.  AAD options are limited with h/o CAD and CrCl too low for dofetilide.  Continue Eliquis 2.5 mg BID (age, weight <60kg) We discussed her stroke risk and the risks and benefits of anticoagulation. Bleeding precautions given.   2. Secondary Hypercoagulable State (ICD10:  D68.69) The patient is at significant risk for stroke/thromboembolism based upon her CHA2DS2-VASc Score of 7.  Continue Apixaban (Eliquis).   3. CAD S/p CABG No anginal symptoms.  4. Chronic  systolic CHF S/p ICD No signs or symptoms of fluid overload today.  5. Valvular heart disease S/p tricuspid and mitral annuloplasty. Followed by Dr Johnsie Cancel.  6. HTN Stable, no changes today.   Follow up with Dr Curt Bears and Johnsie Cancel per recall.    [facility] [address]  [place], [place] [address] [phone] 01/23/2020 3:58 PM

## 2020-01-24 NOTE — Progress Notes (Signed)
Remote ICD transmission.   

## 2020-01-25 ENCOUNTER — Other Ambulatory Visit: Payer: Self-pay | Admitting: Family Medicine

## 2020-02-03 ENCOUNTER — Ambulatory Visit: Payer: PPO | Admitting: Podiatry

## 2020-02-10 ENCOUNTER — Ambulatory Visit: Payer: PPO | Admitting: Podiatry

## 2020-02-17 ENCOUNTER — Other Ambulatory Visit: Payer: Self-pay

## 2020-02-17 ENCOUNTER — Ambulatory Visit: Payer: PPO | Admitting: Podiatry

## 2020-02-17 DIAGNOSIS — Q666 Other congenital valgus deformities of feet: Secondary | ICD-10-CM

## 2020-02-17 DIAGNOSIS — M79674 Pain in right toe(s): Secondary | ICD-10-CM

## 2020-02-17 DIAGNOSIS — B351 Tinea unguium: Secondary | ICD-10-CM | POA: Diagnosis not present

## 2020-02-17 DIAGNOSIS — E1151 Type 2 diabetes mellitus with diabetic peripheral angiopathy without gangrene: Secondary | ICD-10-CM

## 2020-02-17 DIAGNOSIS — M79675 Pain in left toe(s): Secondary | ICD-10-CM

## 2020-02-21 ENCOUNTER — Encounter: Payer: Self-pay | Admitting: Podiatry

## 2020-02-21 NOTE — Progress Notes (Signed)
Subjective:  Patient ID: Amy Wolf, female    DOB: 1927-11-18,  MRN: [id number]  Chief Complaint  Patient presents with  . Foot Pain    pt is here for 3 month routine foot care.   86 y.o. female returns for the above complaint.  Patient presents with painful elongated thickened mycotic toenails x10.  Patient states that they hurt when they are ambulating.  Patient is known to my other patient Trilby Drummer Sedlak's.  Patient secondary complaint of bunion pain with underlying pes planovalgus.  Patient states is still hurting a little bit.  The injections have helped a little bit but not for long.  She states that she is is interested in walking without pain.  She has not tried orthotics.  She denies any other acute complaints.  She is interested in getting orthotics.  Objective:  There were no vitals filed for this visit. Podiatric Exam: Vascular: dorsalis pedis and posterior tibial pulses are palpable bilateral. Capillary return is immediate. Temperature gradient is WNL. Skin turgor WNL  Sensorium: Normal Semmes Weinstein monofilament test. Normal tactile sensation bilaterally. Nail Exam: Pt has thick disfigured discolored nails with subungual debris noted bilateral entire nail hallux through fifth toenails Ulcer Exam: There is no evidence of ulcer or pre-ulcerative changes or infection. Orthopedic Exam: Muscle tone and strength are WNL. No limitations in general ROM. No crepitus or effusions noted. HAV  B/L.  Hammer toes 2-5  B/L.  Limited range of motion noted of the right first MPJ.  There is crepitus noted.  No pain on palpation to the right medial aspect of the first metatarsophalangeal joint.  Bunion deformity noted to the bilateral lower extremity with the right worse than left.  It appears to be track bound not tracking deformity.  Calcaneal valgus noted with too many toe signs unable to recruit the arch with dorsiflexion of the hallux Skin: No Porokeratosis. No infection or ulcers  Assessment &  Plan:  Patient was evaluated and treated and all questions answered.  Right first metatarsophalangeal joint arthritis/capsulitis -Resolved with a steroid injection.  No further injections are warranted as the pain has resolved.  If it continues to get worse or returns we will consider doing orthotics management with Morton's extension.  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  I discussed with the patient various treatment options mostly conservative in nature.  Patient states she will obtain wider shoe gear modification as well as offload the bunion with padding.  At this time patient is not an ideal candidate for surgery given her age as well as other risk factors.  -I discussed with the patient that the driving force for the bunion is the underlying pes planovalgus.  I believe patient will benefit from custom-made orthotics to help address the pes planovalgus portion of the foot structure as well as Morton's extension for the capsulitis of the first MPJ.  Patient agrees with the plan she will be scheduled to see Liliane Channel for orthotics   Onychomycosis with pain  -Nails palliatively debrided as below. -Educated on self-care  Procedure: Nail Debridement Rationale: pain  Type of Debridement: manual, sharp debridement. Instrumentation: Nail nipper, rotary burr. Number of Nails: 10  Procedures and Treatment: Consent by patient was obtained for treatment procedures. The patient understood the discussion of treatment and procedures well. All questions were answered thoroughly reviewed. Debridement of mycotic and  hypertrophic toenails, 1 through 5 bilateral and clearing of subungual debris. No ulceration, no infection noted.  Return Visit-Office Procedure: Patient instructed to return to the office for a follow up visit 3 months  for continued evaluation and treatment.  Boneta Lucks, DPM    Return in about 3 months (around 05/19/2020) for See Liliane Channel for orthotics ASAP. see me in 3 months  RFC.

## 2020-02-23 DIAGNOSIS — H401133 Primary open-angle glaucoma, bilateral, severe stage: Secondary | ICD-10-CM | POA: Diagnosis not present

## 2020-02-28 ENCOUNTER — Ambulatory Visit: Payer: PPO | Admitting: Orthotics

## 2020-02-28 ENCOUNTER — Other Ambulatory Visit: Payer: Self-pay

## 2020-02-28 DIAGNOSIS — Q666 Other congenital valgus deformities of feet: Secondary | ICD-10-CM | POA: Diagnosis not present

## 2020-02-28 DIAGNOSIS — L84 Corns and callosities: Secondary | ICD-10-CM

## 2020-02-28 DIAGNOSIS — E1151 Type 2 diabetes mellitus with diabetic peripheral angiopathy without gangrene: Secondary | ICD-10-CM

## 2020-02-28 NOTE — Progress Notes (Signed)
Patient cast today for super soft accomdative f/o to address issues secondary to DM2;   Dawn to get HTA prior [place].

## 2020-03-27 ENCOUNTER — Other Ambulatory Visit: Payer: Self-pay

## 2020-03-27 ENCOUNTER — Ambulatory Visit: Payer: PPO | Admitting: Orthotics

## 2020-03-27 DIAGNOSIS — E1151 Type 2 diabetes mellitus with diabetic peripheral angiopathy without gangrene: Secondary | ICD-10-CM

## 2020-03-27 DIAGNOSIS — Q666 Other congenital valgus deformities of feet: Secondary | ICD-10-CM

## 2020-03-27 NOTE — Progress Notes (Signed)
Patient came in today to pick up custom made foot orthotics.  The goals were accomplished and the patient reported no dissatisfaction with said orthotics.  Patient was advised of breakin period and how to report any issues. 

## 2020-04-03 ENCOUNTER — Other Ambulatory Visit: Payer: Self-pay | Admitting: Family Medicine

## 2020-04-05 ENCOUNTER — Encounter: Payer: Self-pay | Admitting: Family Medicine

## 2020-04-05 ENCOUNTER — Ambulatory Visit (INDEPENDENT_AMBULATORY_CARE_PROVIDER_SITE_OTHER): Payer: PPO

## 2020-04-05 DIAGNOSIS — Z23 Encounter for immunization: Secondary | ICD-10-CM

## 2020-04-06 ENCOUNTER — Other Ambulatory Visit (HOSPITAL_COMMUNITY): Payer: Self-pay | Admitting: Cardiovascular Disease

## 2020-04-06 ENCOUNTER — Ambulatory Visit (HOSPITAL_COMMUNITY)
Admission: RE | Admit: 2020-04-06 | Discharge: 2020-04-06 | Disposition: A | Discharge status: Home / Self Care | Payer: PPO | Source: Ambulatory Visit | Attending: Cardiovascular Disease | Admitting: Cardiovascular Disease

## 2020-04-06 ENCOUNTER — Other Ambulatory Visit: Payer: Self-pay

## 2020-04-06 DIAGNOSIS — I6521 Occlusion and stenosis of right carotid artery: Secondary | ICD-10-CM | POA: Insufficient documentation

## 2020-04-06 DIAGNOSIS — I6523 Occlusion and stenosis of bilateral carotid arteries: Secondary | ICD-10-CM

## 2020-04-16 ENCOUNTER — Encounter: Payer: Self-pay | Admitting: Family Medicine

## 2020-04-20 ENCOUNTER — Ambulatory Visit (INDEPENDENT_AMBULATORY_CARE_PROVIDER_SITE_OTHER): Payer: PPO

## 2020-04-20 DIAGNOSIS — I480 Paroxysmal atrial fibrillation: Secondary | ICD-10-CM

## 2020-04-20 DIAGNOSIS — I5022 Chronic systolic (congestive) heart failure: Secondary | ICD-10-CM

## 2020-04-21 LAB — CUP PACEART REMOTE DEVICE CHECK
Battery Remaining Longevity: 17 mo
Battery Voltage: 2.9 V
Brady Statistic RA Percent Paced: 0.82 %
Brady Statistic RV Percent Paced: 81 %
Date Time Interrogation Session: 20211015001603
HighPow Impedance: 55 Ohm
Implantable Lead Implant Date: 20070502
Implantable Lead Implant Date: 20150205
Implantable Lead Location: 753859
Implantable Lead Location: 753860
Implantable Lead Special Function: 2015
Implantable Pulse Generator Implant Date: 20150205
Lead Channel Impedance Value: 304 Ohm
Lead Channel Impedance Value: 361 Ohm
Lead Channel Impedance Value: 456 Ohm
Lead Channel Pacing Threshold Amplitude: 0.625 V
Lead Channel Pacing Threshold Amplitude: 1 V
Lead Channel Pacing Threshold Pulse Width: 0.4 ms
Lead Channel Pacing Threshold Pulse Width: 0.4 ms
Lead Channel Sensing Intrinsic Amplitude: 1.5 mV
Lead Channel Sensing Intrinsic Amplitude: 1.5 mV
Lead Channel Sensing Intrinsic Amplitude: 8.125 mV
Lead Channel Sensing Intrinsic Amplitude: 8.125 mV
Lead Channel Setting Pacing Amplitude: 2 V
Lead Channel Setting Pacing Amplitude: 2 V
Lead Channel Setting Pacing Pulse Width: 0.4 ms
Lead Channel Setting Sensing Sensitivity: 0.3 mV

## 2020-04-24 ENCOUNTER — Other Ambulatory Visit: Payer: Self-pay | Admitting: Family Medicine

## 2020-04-25 NOTE — Progress Notes (Signed)
Remote ICD transmission.   

## 2020-04-26 ENCOUNTER — Ambulatory Visit (INDEPENDENT_AMBULATORY_CARE_PROVIDER_SITE_OTHER): Payer: PPO | Admitting: Family Medicine

## 2020-04-26 ENCOUNTER — Encounter: Payer: Self-pay | Admitting: Family Medicine

## 2020-04-26 ENCOUNTER — Other Ambulatory Visit: Payer: Self-pay

## 2020-04-26 VITALS — BP 120/72 | HR 64 | Temp 99.1°F | Ht 64.0 in | Wt 99.4 lb

## 2020-04-26 DIAGNOSIS — R252 Cramp and spasm: Secondary | ICD-10-CM

## 2020-04-26 DIAGNOSIS — E1122 Type 2 diabetes mellitus with diabetic chronic kidney disease: Secondary | ICD-10-CM | POA: Diagnosis not present

## 2020-04-26 DIAGNOSIS — R296 Repeated falls: Secondary | ICD-10-CM

## 2020-04-26 MED ORDER — AMLODIPINE BESYLATE 2.5 MG PO TABS: 2.5000 mg | ORAL_TABLET | Freq: Every day | ORAL | 3 refills | Status: DC

## 2020-04-26 NOTE — Patient Instructions (Signed)
-  home health for PT for leg exercises/transfers and falls.   -for leg cramping I want you to work on drinking water and stretching before bed.  -checking labs to make sure nothing causing leg cramps -continue mustard as needed if this is helping.   -covid booster.   So good to see you!  Dr. Rogers Blocker

## 2020-04-26 NOTE — Progress Notes (Signed)
Patient: Amy Wolf MRN: [id number] DOB: 04/22/1928 PCP: Orma Flaming, MD      Subjective:  Chief Complaint  Patient presents with  . Fall  . Leg/feet cramping    HPI: The patient is a 86 y.o. female who presents today for cramping in legs/feet, and frequent falls. Pt says that she eats mustard for cramps and it helps. Daughter says after her second fall she doesn't move well.  Cramping in feet/legs She states this started about 2 weeks ago. She doesn't get cramps everyday. She tends to get cramps mostly at night. They don't last long at all and she will go take some mustard and they go away. She feels it squeezes into a ball, but it will go away after 10 minutes with mustard. Before she was taking mustard it would last 30 minutes. She has poor Po intake and does not drink much water. Continues to lose weight. She denies having memory issues as far as forgetting to eat.   Recent falls She fell twice last month. She does live alone with her husband, but her daughter sees them daily. The first fall she was at the kitchen sink and was trying to turn around and fell on her butt. The other time she was sitting on the side of the bed and just slid off the side of the bed and hit the floor on her bottom. After the first fall she has now been doing a shuffling gait. She has no other focal deficits, denies any weakness. She denies any dizziness with the falls. Daughter denies any confusion.    Review of Systems  Constitutional: Negative for chills and fever.  Respiratory: Negative for shortness of breath and wheezing.   Genitourinary: Negative for frequency, pelvic pain and urgency.  Musculoskeletal: Positive for arthralgias and myalgias. Negative for joint swelling.  Neurological: Negative for dizziness and headaches.  Psychiatric/Behavioral: Negative for confusion.    Allergies Patient is allergic to iodine, erythromycin, penicillins, and sulfa antibiotics.  Past Medical History Patient   has a past medical history of AICD (automatic cardioverter/defibrillator) present, Arrhythmia, Arthritis, Atherosclerosis of right carotid artery (04/01/2019), Benign essential HTN (04/01/2019), CAD (coronary artery disease) (04/01/2019), Cardiac arrhythmia due to congenital heart disease, CHF (congestive heart failure) ([place]) (04/01/2019), Chronic kidney disease (CKD), stage III (moderate) ([place]) (04/04/2019), Diabetes ([place]), Glaucoma, Heart murmur, CABG (04/01/2019), Hyperlipemia, Migraines, Pacemaker, Paroxysmal A-fib ([place]) (04/01/2019), PVD (peripheral vascular disease) ([place]) (04/01/2019), Type 2 diabetes mellitus with diabetic chronic kidney disease ([place]) (04/01/2019), Urine incontinence, and Valvular heart disease (04/01/2019).  Surgical History Patient  has a past surgical history that includes Abdominal hysterectomy; Back surgery (1980); Hip fracture surgery (2015); Coronary artery bypass graft; Cardiac defibrillator placement; and PPM GENERATOR CHANGEOUT.  Family History Pateint's family history includes Cancer in her daughter, father, and sister.  Social History Patient  reports that she has never smoked. She has never used smokeless tobacco. She reports previous alcohol use. She reports that she does not use drugs.    Objective: Vitals:   04/26/20 0859  BP: 120/72  Pulse: 64  Temp: 99.1 F (37.3 C)  TempSrc: Temporal  SpO2: 98%  Weight: 99 lb 6.4 oz (45.1 kg)  Height: 5\' 4"  (1.626 m)    Body mass index is 17.06 kg/m.  Physical Exam Vitals reviewed.  Constitutional:      Appearance: Normal appearance. She is normal weight.     Comments: Very thin   HENT:     Head: Normocephalic and atraumatic.  Cardiovascular:  Rate and Rhythm: Normal rate and regular rhythm.     Heart sounds: Normal heart sounds.  Pulmonary:     Effort: Pulmonary effort is normal.     Breath sounds: Normal breath sounds.  Abdominal:     General: Abdomen is flat. Bowel sounds are normal.     Palpations:  Abdomen is soft.  Neurological:     General: No focal deficit present.     Mental Status: She is alert.     Comments: Did not know president or date   Psychiatric:        Mood and Affect: Mood normal.        Behavior: Behavior normal.        Assessment/plan: 1. Leg cramping Mustard is working. Will check labs to make sure no pathological issue. Encouraged water and stretching. If any abnormal lab will treat and discussed I just don't want to do any muscle relaxer due to drowsiness and fall risk. They are happy with this.  - CBC with Differential/Platelet; Future - TSH; Future - CK; Future - Magnesium; Future - Comprehensive metabolic panel; Future - Comprehensive metabolic panel - Magnesium - CK - TSH - CBC with Differential/Platelet  2. Falls frequently Home health for strength and safety. Checking labs/a1c to make sure not too tightly controlled and risk of hypoglycemia with the glipizide- Ambulatory referral to [facility]  3. Type 2 diabetes mellitus with chronic kidney disease, without long-term current use of insulin, unspecified CKD stage (HCC)  - Hemoglobin A1c; Future - Hemoglobin A1c    This visit occurred during the SARS-CoV-2 public health emergency.  Safety protocols were in place, including screening questions prior to the visit, additional usage of staff PPE, and extensive cleaning of exam room while observing appropriate contact time as indicated for disinfecting solutions.     Return if symptoms worsen or fail to improve.   Orma Flaming, MD [place]   04/27/2020

## 2020-04-27 LAB — CBC WITH DIFFERENTIAL/PLATELET
Absolute Monocytes: 437 cells/uL (ref 200–950)
Basophils Absolute: 41 cells/uL (ref 0–200)
Basophils Relative: 0.7 %
Eosinophils Absolute: 159 cells/uL (ref 15–500)
Eosinophils Relative: 2.7 %
HCT: 35.2 % (ref 35.0–45.0)
Hemoglobin: 11.4 g/dL — ABNORMAL LOW (ref 11.7–15.5)
Lymphs Abs: 1298 cells/uL (ref 850–3900)
MCH: 31.6 pg (ref 27.0–33.0)
MCHC: 32.4 g/dL (ref 32.0–36.0)
MCV: 97.5 fL (ref 80.0–100.0)
MPV: 9.6 fL (ref 7.5–12.5)
Monocytes Relative: 7.4 %
Neutro Abs: 3965 cells/uL (ref 1500–7800)
Neutrophils Relative %: 67.2 %
Platelets: 213 10*3/uL (ref 140–400)
RBC: 3.61 10*6/uL — ABNORMAL LOW (ref 3.80–5.10)
RDW: 12.6 % (ref 11.0–15.0)
Total Lymphocyte: 22 %
WBC: 5.9 10*3/uL (ref 3.8–10.8)

## 2020-04-27 LAB — COMPREHENSIVE METABOLIC PANEL
AG Ratio: 1.6 (calc) (ref 1.0–2.5)
ALT: 17 U/L (ref 6–29)
AST: 21 U/L (ref 10–35)
Albumin: 4.1 g/dL (ref 3.6–5.1)
Alkaline phosphatase (APISO): 53 U/L (ref 37–153)
BUN/Creatinine Ratio: 19 (calc) (ref 6–22)
BUN: 21 mg/dL (ref 7–25)
CO2: 26 mmol/L (ref 20–32)
Calcium: 9.3 mg/dL (ref 8.6–10.4)
Chloride: 108 mmol/L (ref 98–110)
Creat: 1.08 mg/dL — ABNORMAL HIGH (ref 0.60–0.88)
Globulin: 2.5 g/dL (calc) (ref 1.9–3.7)
Glucose, Bld: 123 mg/dL — ABNORMAL HIGH (ref 65–99)
Potassium: 4.4 mmol/L (ref 3.5–5.3)
Sodium: 142 mmol/L (ref 135–146)
Total Bilirubin: 0.4 mg/dL (ref 0.2–1.2)
Total Protein: 6.6 g/dL (ref 6.1–8.1)

## 2020-04-27 LAB — TSH: TSH: 2.41 mIU/L (ref 0.40–4.50)

## 2020-04-27 LAB — HEMOGLOBIN A1C
Hgb A1c MFr Bld: 6.3 % of total Hgb — ABNORMAL HIGH (ref <5.7)
Mean Plasma Glucose: 134 (calc)
eAG (mmol/L): 7.4 (calc)

## 2020-04-27 LAB — CK: Total CK: 51 U/L (ref 29–143)

## 2020-04-27 LAB — MAGNESIUM: Magnesium: 2.2 mg/dL (ref 1.5–2.5)

## 2020-04-28 ENCOUNTER — Encounter: Payer: Self-pay | Admitting: Family Medicine

## 2020-04-30 ENCOUNTER — Other Ambulatory Visit: Payer: Self-pay | Admitting: Cardiology

## 2020-04-30 DIAGNOSIS — Z7901 Long term (current) use of anticoagulants: Secondary | ICD-10-CM | POA: Diagnosis not present

## 2020-04-30 DIAGNOSIS — I480 Paroxysmal atrial fibrillation: Secondary | ICD-10-CM | POA: Diagnosis not present

## 2020-04-30 DIAGNOSIS — E1122 Type 2 diabetes mellitus with diabetic chronic kidney disease: Secondary | ICD-10-CM | POA: Diagnosis not present

## 2020-04-30 DIAGNOSIS — I130 Hypertensive heart and chronic kidney disease with heart failure and stage 1 through stage 4 chronic kidney disease, or unspecified chronic kidney disease: Secondary | ICD-10-CM | POA: Diagnosis not present

## 2020-04-30 DIAGNOSIS — E1140 Type 2 diabetes mellitus with diabetic neuropathy, unspecified: Secondary | ICD-10-CM | POA: Diagnosis not present

## 2020-04-30 DIAGNOSIS — H409 Unspecified glaucoma: Secondary | ICD-10-CM | POA: Diagnosis not present

## 2020-04-30 DIAGNOSIS — I2510 Atherosclerotic heart disease of native coronary artery without angina pectoris: Secondary | ICD-10-CM | POA: Diagnosis not present

## 2020-04-30 DIAGNOSIS — N183 Chronic kidney disease, stage 3 unspecified: Secondary | ICD-10-CM | POA: Diagnosis not present

## 2020-04-30 DIAGNOSIS — E785 Hyperlipidemia, unspecified: Secondary | ICD-10-CM | POA: Diagnosis not present

## 2020-04-30 DIAGNOSIS — I509 Heart failure, unspecified: Secondary | ICD-10-CM | POA: Diagnosis not present

## 2020-04-30 MED ORDER — GLIPIZIDE ER 2.5 MG PO TB24: 2.5000 mg | ORAL_TABLET | Freq: Every day | ORAL | 1 refills | Status: DC

## 2020-04-30 NOTE — Telephone Encounter (Signed)
Prescription refill request for Eliquis received.  Last office visit: [place], 01/23/2020 Scr: 1.08, 04/26/2020 Age: 86 y.o. Weight: 45.1 kg    Prescription refill sent.

## 2020-05-03 ENCOUNTER — Other Ambulatory Visit: Payer: Self-pay | Admitting: Cardiology

## 2020-05-07 ENCOUNTER — Telehealth: Payer: Self-pay

## 2020-05-07 ENCOUNTER — Encounter: Payer: Self-pay | Admitting: Family Medicine

## 2020-05-07 DIAGNOSIS — N183 Chronic kidney disease, stage 3 unspecified: Secondary | ICD-10-CM | POA: Diagnosis not present

## 2020-05-07 DIAGNOSIS — Z7901 Long term (current) use of anticoagulants: Secondary | ICD-10-CM | POA: Diagnosis not present

## 2020-05-07 DIAGNOSIS — E785 Hyperlipidemia, unspecified: Secondary | ICD-10-CM | POA: Diagnosis not present

## 2020-05-07 DIAGNOSIS — I480 Paroxysmal atrial fibrillation: Secondary | ICD-10-CM | POA: Diagnosis not present

## 2020-05-07 DIAGNOSIS — E1122 Type 2 diabetes mellitus with diabetic chronic kidney disease: Secondary | ICD-10-CM | POA: Diagnosis not present

## 2020-05-07 DIAGNOSIS — I2510 Atherosclerotic heart disease of native coronary artery without angina pectoris: Secondary | ICD-10-CM | POA: Diagnosis not present

## 2020-05-07 DIAGNOSIS — H409 Unspecified glaucoma: Secondary | ICD-10-CM | POA: Diagnosis not present

## 2020-05-07 DIAGNOSIS — E1140 Type 2 diabetes mellitus with diabetic neuropathy, unspecified: Secondary | ICD-10-CM | POA: Diagnosis not present

## 2020-05-07 DIAGNOSIS — I509 Heart failure, unspecified: Secondary | ICD-10-CM | POA: Diagnosis not present

## 2020-05-07 DIAGNOSIS — I130 Hypertensive heart and chronic kidney disease with heart failure and stage 1 through stage 4 chronic kidney disease, or unspecified chronic kidney disease: Secondary | ICD-10-CM | POA: Diagnosis not present

## 2020-05-07 NOTE — Telephone Encounter (Signed)
Form completed and faxed to number provided on the document. Also placed at the front for billing.

## 2020-05-07 NOTE — Telephone Encounter (Signed)
Home Health Certification or Plan of Care Tracking  Is this a Certification or Plan of Care? BOTH    HH Agency: [facility]   Order Number: [id number]     Where has form been placed:  Dr.Wolfe's folder up front

## 2020-05-08 ENCOUNTER — Other Ambulatory Visit: Payer: Self-pay

## 2020-05-08 ENCOUNTER — Encounter: Payer: Self-pay | Admitting: Cardiology

## 2020-05-08 ENCOUNTER — Ambulatory Visit: Payer: PPO | Admitting: Cardiology

## 2020-05-08 VITALS — BP 122/62 | HR 63 | Ht 64.0 in | Wt 99.0 lb

## 2020-05-08 DIAGNOSIS — I255 Ischemic cardiomyopathy: Secondary | ICD-10-CM

## 2020-05-08 NOTE — Progress Notes (Signed)
Electrophysiology Office Note   Date:  05/08/2020   ID:  Amy Wolf, DOB December 20, 1927, MRN [id number]  PCP:  Orma Flaming, MD  Cardiologist:  Johnsie Cancel Primary Electrophysiologist:  Hailley Byers Meredith Leeds, MD    Chief Complaint: ICD   History of Present Illness: Amy Wolf is a 86 y.o. female who is being seen today for the evaluation of ICD at the request of Orma Flaming, MD. Presenting today for electrophysiology evaluation.  She has a history significant for hypertension, diabetes, coronary artery disease, anemia, and carotid artery disease.  She had mitral and tricuspid annuloplasty rings done when she had her CABG.  She also has a likely ischemic cardiomyopathy and has a Medtronic ICD.  She has paroxysmal atrial fibrillation, though she is not anticoagulated due to frequent falls.    Today, denies symptoms of palpitations, chest pain, shortness of breath, orthopnea, PND, lower extremity edema, claudication, dizziness, presyncope, syncope, bleeding, or neurologic sequela. The patient is tolerating medications without difficulties.  Since last being seen she has done well.  She has no chest pain or shortness of breath.  She is able to do all of her daily activities without restriction.  She has no acute complaints at this time.  She remains in atrial fibrillation.  She actually feels better since being taken off of her Multaq.   Past Medical History:  Diagnosis Date  . AICD (automatic cardioverter/defibrillator) present   . Arrhythmia   . Arthritis   . Atherosclerosis of right carotid artery 04/01/2019   80% blockage. Last ultrasound in 12/2018. Saw vascular surgery in June 2020. Put on DPT in June (asa added by Psychologist, sport and exercise)  . Benign essential HTN 04/01/2019  . CAD (coronary artery disease) 04/01/2019  . Cardiac arrhythmia due to congenital heart disease   . CHF (congestive heart failure) ([place]) 04/01/2019  . Chronic kidney disease (CKD), stage III (moderate) ([place]) 04/04/2019  . Diabetes ([place])    . Glaucoma   . Heart murmur   . Hx of CABG 04/01/2019   Several years ago per family   . Hyperlipemia   . Migraines   . Pacemaker   . Paroxysmal A-fib ([place]) 04/01/2019   warafin in the remote past, but changed to plavix. CHADS2 score of 8%. High fall risk...   . PVD (peripheral vascular disease) ([place]) 04/01/2019  . Type 2 diabetes mellitus with diabetic chronic kidney disease ([place]) 04/01/2019  . Urine incontinence   . Valvular heart disease 04/01/2019   Mitral and tricuspid annuloplasty    Past Surgical History:  Procedure Laterality Date  . ABDOMINAL HYSTERECTOMY    . BACK SURGERY  1980  . CARDIAC DEFIBRILLATOR PLACEMENT    . CORONARY ARTERY BYPASS GRAFT    . HIP FRACTURE SURGERY  2015  . PPM GENERATOR CHANGEOUT       Current Outpatient Medications  Medication Sig Dispense Refill  . acetaminophen (TYLENOL) 500 MG tablet Take 500 mg by mouth in the morning and at bedtime.     . bimatoprost (LUMIGAN) 0.01 % SOLN Place 1 drop into both eyes at bedtime.    . clopidogrel (PLAVIX) 75 MG tablet TAKE 1 TABLET BY MOUTH EVERY DAY 90 tablet 0  . ELIQUIS 2.5 MG TABS tablet TAKE 1 TABLET BY MOUTH TWICE A DAY 60 tablet 5  . ferrous sulfate 325 (65 FE) MG tablet Take 325 mg by mouth daily with breakfast.     . glipiZIDE (GLUCOTROL XL) 2.5 MG 24 hr tablet Take 1 tablet (2.5 mg total)  by mouth daily with breakfast. 90 tablet 1  . Melatonin 3 MG CAPS Take 3 mg by mouth at bedtime.     . metoprolol tartrate (LOPRESSOR) 50 MG tablet TAKE 1.5 TABLETS (75 MG TOTAL) BY MOUTH 2 TIMES DAILY. 270 tablet 0  . Netarsudil Dimesylate (RHOPRESSA) 0.02 % SOLN Apply to eye. 1 gtt both eyes q hs    . pravastatin (PRAVACHOL) 80 MG tablet Take 80 mg by mouth daily.    . ramipril (ALTACE) 2.5 MG capsule Take by mouth daily.     No current facility-administered medications for this visit.    Allergies:   Iodine, Erythromycin, Penicillins, and Sulfa antibiotics   Social History:  The patient  reports that she  has never smoked. She has never used smokeless tobacco. She reports previous alcohol use. She reports that she does not use drugs.   Family History:  The patient's family history includes Cancer in her daughter, father, and sister.   ROS:  Please see the history of present illness.   Otherwise, review of systems is positive for none.   All other systems are reviewed and negative.   PHYSICAL EXAM: VS:  BP 122/62   Pulse 63   Ht 5\' 4"  (1.626 m)   Wt 99 lb (44.9 kg)   SpO2 95%   BMI 16.99 kg/m  , BMI Body mass index is 16.99 kg/m. GEN: Well nourished, well developed, in no acute distress  HEENT: normal  Neck: no JVD, carotid bruits, or masses Cardiac: RRR; no murmurs, rubs, or gallops,no edema  Respiratory:  clear to auscultation bilaterally, normal work of breathing GI: soft, nontender, nondistended, + BS MS: no deformity or atrophy  Skin: warm and dry, device site well healed Neuro:  Strength and sensation are intact Psych: euthymic mood, full affect  EKG:  EKG is not ordered today. Personal review of the ekg ordered 01/23/20 shows atrial fibrillation, ventricular paced  Personal review of the device interrogation today. Results in [place]: 04/26/2020: ALT 17; BUN 21; Creat 1.08; Hemoglobin 11.4; Magnesium 2.2; Platelets 213; Potassium 4.4; Sodium 142; TSH 2.41    Lipid Panel     Component Value Date/Time   CHOL 170 04/01/2019 1133   TRIG 87.0 04/01/2019 1133   HDL 63.80 04/01/2019 1133   CHOLHDL 3 04/01/2019 1133   VLDL 17.4 04/01/2019 1133   LDLCALC 89 04/01/2019 1133     Wt Readings from Last 3 Encounters:  05/08/20 99 lb (44.9 kg)  04/26/20 99 lb 6.4 oz (45.1 kg)  01/23/20 102 lb 3.2 oz (46.4 kg)      Other studies Reviewed: Additional studies/ records that were reviewed today include: TTE 04/12/19  Review of the above records today demonstrates:   1. Left ventricular ejection fraction, by visual estimation, is 30%. The left ventricle has  normal function. Normal left ventricular size. There is mildly increased left ventricular hypertrophy. Septal-lateral dyssynchrony with diffuse hypokinesis.  2. Left ventricular diastolic Doppler parameters are indeterminate pattern of LV diastolic filling.  3. S/p tricuspid valve repair. No more than moderate tricuspid regurgitation visually though thick CW doppler jet raises concern for more significant tricuspid regurgitation.  4. S/p mitral valve repair. No more than moderate mitral regurgitation visually, though thick CW doppler jet raises concern for more significant mitral regurgitation. Mean gradient 3 mmHg across the mitral valve, no evidence for significant stenosis.  5. The aortic valve is tricuspid Aortic valve regurgitation is trivial by color flow Doppler. Mild  aortic valve sclerosis without stenosis.  6. Global right ventricle has mildly reduced systolic function.The right ventricular size is mildly enlarged. No increase in right ventricular wall thickness.  7. A pacer wire is visualized in the RV.  8. The tricuspid valve is normal in structure. Tricuspid valve regurgitation is mild.  9. Right atrial size was moderately dilated. 10. Left atrial size was severely dilated. 11. The inferior vena cava is dilated in size with >50% respiratory variability, suggesting right atrial pressure of 8 mmHg. 12. The tricuspid regurgitant velocity is 3.35 m/s, and with an assumed right atrial pressure of 8 mmHg, the estimated right ventricular systolic pressure is moderately elevated at 52.9 mmHg.   ASSESSMENT AND PLAN:  1.  Coronary artery disease: Status post CABG.  No current chest pain.  2.  Valvular heart disease: Status post tricuspid and mitral valve annuloplasty.  Echo is stable results.  Plan per primary cardiology.  3.  Chronic systolic heart failure: Likely due to ischemic cardiomyopathy.  Status post Medtronic ICD.  Device functioning appropriately.  No changes.  4.  Carotid artery  disease: Has been referred to vascular surgery.  5.  Hypertension: Currently well controlled.  She is on Norvasc currently.  We Benjamim Harnish stop it at this time.  6.  Permanent atrial fibrillation:   On Eliquis and Plavix.  CHA2DS2-VASc of 8.  Unaware of her atrial fibrillation.  No changes.    Current medicines are reviewed at length with the patient today.   The patient does not have concerns regarding her medicines.  The following changes were made today: Stop Norvasc  Labs/ tests ordered today include:  No orders of the defined types were placed in this encounter.    Disposition:   FU with Keshanna Riso 1 year  Signed, Hutchinson Isenberg Meredith Leeds, MD  05/08/2020 2:48 PM     [place] [address] [place] [address] [phone] (office) [phone] (fax)

## 2020-05-08 NOTE — Patient Instructions (Signed)
Medication Instructions:  Your physician has recommended you make the following change in your medication:  1. STOP Norvasc  *If you need a refill on your cardiac medications before your next appointment, please call your pharmacy*   Lab Work: None ordered  Testing/Procedures: None ordered   Follow-Up: At [facility], you and your health needs are our priority.  As part of our continuing mission to provide you with exceptional heart care, we have created designated Provider Care Teams.  These Care Teams include your primary Cardiologist (physician) and Advanced Practice Providers (APPs -  Physician Assistants and Nurse Practitioners) who all work together to provide you with the care you need, when you need it.  Remote monitoring is used to monitor your Pacemaker or ICD from home. This monitoring reduces the number of office visits required to check your device to one time per year. It allows [place] to keep an eye on the functioning of your device to ensure it is working properly. You are scheduled for a device check from home on 07/21/2019. You may send your transmission at any time that day. If you have a wireless device, the transmission will be sent automatically. After your physician reviews your transmission, you will receive a postcard with your next transmission date.  Your next appointment:   1 year(s)  The format for your next appointment:   In Person  Provider:   Allegra Lai, MD   Thank you for choosing [facility]!!   Trinidad Curet, RN [phone]

## 2020-05-10 DIAGNOSIS — E1122 Type 2 diabetes mellitus with diabetic chronic kidney disease: Secondary | ICD-10-CM | POA: Diagnosis not present

## 2020-05-10 DIAGNOSIS — E1140 Type 2 diabetes mellitus with diabetic neuropathy, unspecified: Secondary | ICD-10-CM | POA: Diagnosis not present

## 2020-05-10 DIAGNOSIS — I2510 Atherosclerotic heart disease of native coronary artery without angina pectoris: Secondary | ICD-10-CM | POA: Diagnosis not present

## 2020-05-10 DIAGNOSIS — I130 Hypertensive heart and chronic kidney disease with heart failure and stage 1 through stage 4 chronic kidney disease, or unspecified chronic kidney disease: Secondary | ICD-10-CM | POA: Diagnosis not present

## 2020-05-10 DIAGNOSIS — H409 Unspecified glaucoma: Secondary | ICD-10-CM | POA: Diagnosis not present

## 2020-05-10 DIAGNOSIS — N183 Chronic kidney disease, stage 3 unspecified: Secondary | ICD-10-CM | POA: Diagnosis not present

## 2020-05-10 DIAGNOSIS — Z7901 Long term (current) use of anticoagulants: Secondary | ICD-10-CM | POA: Diagnosis not present

## 2020-05-10 DIAGNOSIS — E785 Hyperlipidemia, unspecified: Secondary | ICD-10-CM | POA: Diagnosis not present

## 2020-05-10 DIAGNOSIS — I509 Heart failure, unspecified: Secondary | ICD-10-CM | POA: Diagnosis not present

## 2020-05-10 DIAGNOSIS — I480 Paroxysmal atrial fibrillation: Secondary | ICD-10-CM | POA: Diagnosis not present

## 2020-05-25 ENCOUNTER — Encounter: Payer: Self-pay | Admitting: Podiatry

## 2020-05-25 ENCOUNTER — Ambulatory Visit: Payer: PPO | Admitting: Podiatry

## 2020-05-25 ENCOUNTER — Other Ambulatory Visit: Payer: Self-pay

## 2020-05-25 DIAGNOSIS — L909 Atrophic disorder of skin, unspecified: Secondary | ICD-10-CM | POA: Diagnosis not present

## 2020-05-25 DIAGNOSIS — M79675 Pain in left toe(s): Secondary | ICD-10-CM

## 2020-05-25 DIAGNOSIS — B351 Tinea unguium: Secondary | ICD-10-CM

## 2020-05-25 DIAGNOSIS — M79674 Pain in right toe(s): Secondary | ICD-10-CM

## 2020-05-25 DIAGNOSIS — E1151 Type 2 diabetes mellitus with diabetic peripheral angiopathy without gangrene: Secondary | ICD-10-CM

## 2020-05-25 NOTE — Progress Notes (Signed)
  Subjective:  Patient ID: Amy Wolf, female    DOB: [birth year],  MRN: [id number]  Chief Complaint  Patient presents with  . routine foot care    nail trim   86 y.o. female returns for the above complaint.  Patient presents with painful elongated thickened mycotic toenails x10.  Patient states that they hurt when they are ambulating.  Patient is known to my other patient [name].  Patient secondary complaint of bunion pain with underlying pes planovalgus.  Patient states is still hurting a little bit.  The injections have helped a little bit but not for long.  She states that she is is interested in walking without pain.  She has not tried orthotics.  She denies any other acute complaints.  She is interested in getting orthotics.  Objective:  There were no vitals filed for this visit. Podiatric Exam: Vascular: dorsalis pedis and posterior tibial pulses are palpable bilateral. Capillary return is immediate. Temperature gradient is WNL. Skin turgor WNL  Sensorium: Normal Semmes Weinstein monofilament test. Normal tactile sensation bilaterally. Nail Exam: Pt has thick disfigured discolored nails with subungual debris noted bilateral entire nail hallux through fifth toenails Ulcer Exam: There is no evidence of ulcer or pre-ulcerative changes or infection. Orthopedic Exam: Muscle tone and strength are WNL. No limitations in general ROM. No crepitus or effusions noted. HAV  B/L.  Hammer toes 2-5  B/L.  Limited range of motion noted of the right first MPJ.  There is crepitus noted.  No pain on palpation to the right medial aspect of the first metatarsophalangeal joint.  Bunion deformity noted to the bilateral lower extremity with the right worse than left.  It appears to be track bound not tracking deformity.  Calcaneal valgus noted with too many toe signs unable to recruit the arch with dorsiflexion of the hallux Skin: No Porokeratosis. No infection or ulcers  Assessment & Plan:  Patient was  evaluated and treated and all questions answered.  Right first metatarsophalangeal joint arthritis/capsulitis -Resolved with a steroid injection.  No further injections are warranted as the pain has resolved.  If it continues to get worse or returns we will consider doing orthotics management with Morton's extension. -Patient is functioning well with orthotics  Plantar fat pad atrophy -I explained to patient the etiology of plantar fat pad and various treatment options were discussed.  Given that patient does not have any subcutaneous tissue in the ball of the foot is leading to her walking on her bone and therefore causing metatarsalgia-like pain.  I discussed this with patient in extensive detail.  She states understanding.  She does not want any kind of padding offloading.  Onychomycosis with pain  -Nails palliatively debrided as below. -Educated on self-care  Procedure: Nail Debridement Rationale: pain  Type of Debridement: manual, sharp debridement. Instrumentation: Nail nipper, rotary burr. Number of Nails: 10  Procedures and Treatment: Consent by patient was obtained for treatment procedures. The patient understood the discussion of treatment and procedures well. All questions were answered thoroughly reviewed. Debridement of mycotic and hypertrophic toenails, 1 through 5 bilateral and clearing of subungual debris. No ulceration, no infection noted.  Return Visit-Office Procedure: Patient instructed to return to the office for a follow up visit 3 months for continued evaluation and treatment.  Boneta Lucks, DPM    No follow-ups on file.

## 2020-06-11 ENCOUNTER — Encounter: Payer: Self-pay | Admitting: Family Medicine

## 2020-06-11 MED ORDER — RAMIPRIL 2.5 MG PO CAPS
2.5000 mg | ORAL_CAPSULE | Freq: Every day | ORAL | 3 refills | Status: DC
Start: 2020-06-11 — End: 2021-05-29

## 2020-06-27 ENCOUNTER — Other Ambulatory Visit: Payer: Self-pay | Admitting: Family Medicine

## 2020-07-02 ENCOUNTER — Encounter: Payer: Self-pay | Admitting: Family Medicine

## 2020-07-02 NOTE — Telephone Encounter (Signed)
FYI

## 2020-07-05 ENCOUNTER — Ambulatory Visit (INDEPENDENT_AMBULATORY_CARE_PROVIDER_SITE_OTHER): Payer: PPO | Admitting: Family Medicine

## 2020-07-05 ENCOUNTER — Encounter: Payer: Self-pay | Admitting: Family Medicine

## 2020-07-05 ENCOUNTER — Other Ambulatory Visit: Payer: Self-pay

## 2020-07-05 VITALS — BP 81/50 | HR 66 | Temp 97.6°F | Ht 64.0 in | Wt 96.4 lb

## 2020-07-05 DIAGNOSIS — I10 Essential (primary) hypertension: Secondary | ICD-10-CM

## 2020-07-05 DIAGNOSIS — E1122 Type 2 diabetes mellitus with diabetic chronic kidney disease: Secondary | ICD-10-CM | POA: Diagnosis not present

## 2020-07-05 LAB — CBC WITH DIFFERENTIAL/PLATELET
Basophils Absolute: 0 10*3/uL (ref 0.0–0.1)
Basophils Relative: 0.5 % (ref 0.0–3.0)
Eosinophils Absolute: 0.1 10*3/uL (ref 0.0–0.7)
Eosinophils Relative: 2 % (ref 0.0–5.0)
HCT: 34.5 % — ABNORMAL LOW (ref 36.0–46.0)
Hemoglobin: 11.4 g/dL — ABNORMAL LOW (ref 12.0–15.0)
Lymphocytes Relative: 20.8 % (ref 12.0–46.0)
Lymphs Abs: 1.3 10*3/uL (ref 0.7–4.0)
MCHC: 33.1 g/dL (ref 30.0–36.0)
MCV: 94.8 fl (ref 78.0–100.0)
Monocytes Absolute: 0.4 10*3/uL (ref 0.1–1.0)
Monocytes Relative: 6.5 % (ref 3.0–12.0)
Neutro Abs: 4.5 10*3/uL (ref 1.4–7.7)
Neutrophils Relative %: 70.2 % (ref 43.0–77.0)
Platelets: 235 10*3/uL (ref 150.0–400.0)
RBC: 3.64 Mil/uL — ABNORMAL LOW (ref 3.87–5.11)
RDW: 14.2 % (ref 11.5–15.5)
WBC: 6.4 10*3/uL (ref 4.0–10.5)

## 2020-07-05 LAB — COMPREHENSIVE METABOLIC PANEL
ALT: 14 U/L (ref 0–35)
AST: 16 U/L (ref 0–37)
Albumin: 4.1 g/dL (ref 3.5–5.2)
Alkaline Phosphatase: 42 U/L (ref 39–117)
BUN: 28 mg/dL — ABNORMAL HIGH (ref 6–23)
CO2: 27 mEq/L (ref 19–32)
Calcium: 9 mg/dL (ref 8.4–10.5)
Chloride: 107 mEq/L (ref 96–112)
Creatinine, Ser: 1.08 mg/dL (ref 0.40–1.20)
GFR: 45.37 mL/min — ABNORMAL LOW (ref >60.00)
Glucose, Bld: 182 mg/dL — ABNORMAL HIGH (ref 70–99)
Potassium: 4 mEq/L (ref 3.5–5.1)
Sodium: 140 mEq/L (ref 135–145)
Total Bilirubin: 0.4 mg/dL (ref 0.2–1.2)
Total Protein: 6.7 g/dL (ref 6.0–8.3)

## 2020-07-05 LAB — HEMOGLOBIN A1C: Hgb A1c MFr Bld: 6.5 % (ref 4.6–6.5)

## 2020-07-05 MED ORDER — METOPROLOL TARTRATE 50 MG PO TABS: 50.0000 mg | ORAL_TABLET | Freq: Two times a day (BID) | ORAL | 0 refills | Status: DC

## 2020-07-05 NOTE — Progress Notes (Signed)
Patient: Amy Wolf MRN: [id number] DOB: 12-Dec-1927 PCP: Orma Flaming, MD     Subjective:  Chief Complaint  Patient presents with  . Diabetes  . Hypertension    HPI: The patient is a 86 y.o. female who presents today for DM, and kidney function. Her daughter complains of low BP readings.   Her a1c was 6.3 on last check. I had her put her glipizide down to 2.5mg . she is here for a1c reheck. Denies any hypoglycemic events. Still doesn't eat a lot of food.   Her blood pressure readings are 76-120/32-70. Average in the 80/60. She denies any light headedness or dizziness, but is very fatigued. denies any falls.   Review of Systems  Constitutional: Positive for fatigue. Negative for chills and fever.  HENT: Negative for dental problem, ear pain, hearing loss and trouble swallowing.   Eyes: Negative for visual disturbance.  Respiratory: Negative for cough, chest tightness and shortness of breath.   Cardiovascular: Negative for chest pain, palpitations and leg swelling.  Gastrointestinal: Negative for abdominal pain, blood in stool, diarrhea and nausea.  Endocrine: Negative for cold intolerance, polydipsia, polyphagia and polyuria.  Genitourinary: Negative for dysuria, frequency, hematuria and pelvic pain.  Musculoskeletal: Negative for arthralgias.  Skin: Negative for rash.  Neurological: Negative for dizziness and headaches.  Psychiatric/Behavioral: Negative for dysphoric mood and sleep disturbance. The patient is not nervous/anxious.     Allergies Patient is allergic to iodine, erythromycin, penicillins, and sulfa antibiotics.  Past Medical History Patient  has a past medical history of AICD (automatic cardioverter/defibrillator) present, Arrhythmia, Arthritis, Atherosclerosis of right carotid artery (04/01/2019), Benign essential HTN (04/01/2019), CAD (coronary artery disease) (04/01/2019), Cardiac arrhythmia due to congenital heart disease, CHF (congestive heart failure) ([place])  (04/01/2019), Chronic kidney disease (CKD), stage III (moderate) ([place]) (04/04/2019), Diabetes ([place]), Glaucoma, Heart murmur, CABG (04/01/2019), Hyperlipemia, Migraines, Pacemaker, Paroxysmal A-fib ([place]) (04/01/2019), PVD (peripheral vascular disease) ([place]) (04/01/2019), Type 2 diabetes mellitus with diabetic chronic kidney disease ([place]) (04/01/2019), Urine incontinence, and Valvular heart disease (04/01/2019).  Surgical History Patient  has a past surgical history that includes Abdominal hysterectomy; Back surgery (1980); Hip fracture surgery (2015); Coronary artery bypass graft; Cardiac defibrillator placement; and PPM GENERATOR CHANGEOUT.  Family History Pateint's family history includes Cancer in her daughter, father, and sister.  Social History Patient  reports that she has never smoked. She has never used smokeless tobacco. She reports previous alcohol use. She reports that she does not use drugs.    Objective: Vitals:   07/05/20 1321  BP: (!) 81/50  Pulse: 66  Temp: 97.6 F (36.4 C)  TempSrc: Temporal  SpO2: 97%  Weight: 96 lb 6.4 oz (43.7 kg)  Height: 5\' 4"  (1.626 m)    Body mass index is 16.55 kg/m.  Physical Exam Vitals reviewed.  Constitutional:      Appearance: Normal appearance.     Comments: thin  HENT:     Head: Normocephalic.  Cardiovascular:     Rate and Rhythm: Normal rate and regular rhythm.     Heart sounds: Normal heart sounds.  Pulmonary:     Effort: Pulmonary effort is normal.     Breath sounds: Normal breath sounds.  Abdominal:     General: Bowel sounds are normal.     Palpations: Abdomen is soft.  Neurological:     General: No focal deficit present.     Mental Status: She is alert. Mental status is at baseline.  Psychiatric:        Mood and Affect:  Mood normal.        Behavior: Behavior normal.        Assessment/plan: 1. Type 2 diabetes mellitus with diabetic chronic kidney disease, unspecified CKD stage, unspecified whether long term insulin use  (HCC) Checking a1c today. If to goal will continue her 2.5mg  dosage. Just cautious for hypo events as she does not eat a lot of food. Have continued to decrease this down. F/u in 3-6 months.  - Hemoglobin A1c - Comprehensive metabolic panel - CBC with Differential/Platelet  2. Benign essential HTN Want her BP low as her EF is 30%. She is quite low on her readings and very fatigued. Will decrease her metoprolol down to 50mg  Bid from 75mg . She has f/u with cardiology in April. Continue to keep log and let me know if any leg swelling, weight gain, shortness of breath or cough. Other wise f/u in 6 months.    This visit occurred during the SARS-CoV-2 public health emergency.  Safety protocols were in place, including screening questions prior to the visit, additional usage of staff PPE, and extensive cleaning of exam room while observing appropriate contact time as indicated for disinfecting solutions.      Return in about 6 months (around 01/03/2021) for diabetes/htn .    May, MD [facility]   07/05/2020

## 2020-07-05 NOTE — Patient Instructions (Addendum)
For blood pressure  -decrease your metoprolol down to one pill twice a day (is currently taking 1.5 pills twice a day) -checking diabetes labs today.    Good to see you! Dr. Artis Flock    I think you can follow up in 6 months! Happy new year

## 2020-07-11 ENCOUNTER — Encounter: Payer: Self-pay | Admitting: Family Medicine

## 2020-07-12 ENCOUNTER — Other Ambulatory Visit: Payer: Self-pay

## 2020-07-12 MED ORDER — PRAVASTATIN SODIUM 80 MG PO TABS: 80.0000 mg | ORAL_TABLET | Freq: Every day | ORAL | 0 refills | Status: DC

## 2020-07-20 ENCOUNTER — Ambulatory Visit (INDEPENDENT_AMBULATORY_CARE_PROVIDER_SITE_OTHER): Payer: PPO

## 2020-07-20 DIAGNOSIS — I5022 Chronic systolic (congestive) heart failure: Secondary | ICD-10-CM

## 2020-07-20 DIAGNOSIS — I255 Ischemic cardiomyopathy: Secondary | ICD-10-CM | POA: Diagnosis not present

## 2020-07-23 LAB — CUP PACEART REMOTE DEVICE CHECK
Battery Remaining Longevity: 16 mo
Battery Voltage: 2.89 V
Brady Statistic RA Percent Paced: 17.48 %
Brady Statistic RV Percent Paced: 91 %
Date Time Interrogation Session: 20220114054346
HighPow Impedance: 54 Ohm
Implantable Lead Implant Date: 20070502
Implantable Lead Implant Date: 20150205
Implantable Lead Location: 753859
Implantable Lead Location: 753860
Implantable Lead Special Function: 2015
Implantable Pulse Generator Implant Date: 20150205
Lead Channel Impedance Value: 285 Ohm
Lead Channel Impedance Value: 342 Ohm
Lead Channel Impedance Value: 456 Ohm
Lead Channel Pacing Threshold Amplitude: 0.75 V
Lead Channel Pacing Threshold Amplitude: 0.875 V
Lead Channel Pacing Threshold Pulse Width: 0.4 ms
Lead Channel Pacing Threshold Pulse Width: 0.4 ms
Lead Channel Sensing Intrinsic Amplitude: 1.625 mV
Lead Channel Sensing Intrinsic Amplitude: 1.625 mV
Lead Channel Sensing Intrinsic Amplitude: 8.75 mV
Lead Channel Sensing Intrinsic Amplitude: 8.75 mV
Lead Channel Setting Pacing Amplitude: 2 V
Lead Channel Setting Pacing Amplitude: 2 V
Lead Channel Setting Pacing Pulse Width: 0.4 ms
Lead Channel Setting Sensing Sensitivity: 0.3 mV

## 2020-07-26 ENCOUNTER — Other Ambulatory Visit: Payer: Self-pay | Admitting: Cardiovascular Disease

## 2020-07-31 ENCOUNTER — Encounter: Payer: Self-pay | Admitting: Family Medicine

## 2020-08-04 NOTE — Progress Notes (Signed)
Remote ICD transmission.   

## 2020-08-24 ENCOUNTER — Other Ambulatory Visit: Payer: Self-pay

## 2020-08-24 ENCOUNTER — Encounter: Payer: Self-pay | Admitting: Podiatry

## 2020-08-24 ENCOUNTER — Ambulatory Visit (INDEPENDENT_AMBULATORY_CARE_PROVIDER_SITE_OTHER): Payer: PPO | Admitting: Podiatry

## 2020-08-24 ENCOUNTER — Ambulatory Visit: Payer: PPO | Admitting: Podiatry

## 2020-08-24 DIAGNOSIS — M79674 Pain in right toe(s): Secondary | ICD-10-CM

## 2020-08-24 DIAGNOSIS — B351 Tinea unguium: Secondary | ICD-10-CM | POA: Diagnosis not present

## 2020-08-24 DIAGNOSIS — E1151 Type 2 diabetes mellitus with diabetic peripheral angiopathy without gangrene: Secondary | ICD-10-CM

## 2020-08-24 DIAGNOSIS — M79675 Pain in left toe(s): Secondary | ICD-10-CM

## 2020-08-24 NOTE — Progress Notes (Signed)
  Subjective:  Patient ID: Amy Wolf, female    DOB: 10/06/1927,  MRN: [id number]  Chief Complaint  Patient presents with   Nail Problem    Nail trim   87 y.o. female returns for the above complaint.  Patient presents with painful elongated thickened mycotic toenails x10.  Patient states that they hurt when they are ambulating.  She would like to have them debrided down she is not able to do it herself.  She denies any other acute complaints..  Objective:  There were no vitals filed for this visit. Podiatric Exam: Vascular: dorsalis pedis and posterior tibial pulses are palpable bilateral. Capillary return is immediate. Temperature gradient is WNL. Skin turgor WNL  Sensorium: Normal Semmes Weinstein monofilament test. Normal tactile sensation bilaterally. Nail Exam: Pt has thick disfigured discolored nails with subungual debris noted bilateral entire nail hallux through fifth toenails Ulcer Exam: There is no evidence of ulcer or pre-ulcerative changes or infection. Orthopedic Exam: Muscle tone and strength are WNL. No limitations in general ROM. No crepitus or effusions noted. HAV  B/L.  Hammer toes 2-5  B/L.  First metatarsophalangeal joint arthritis noted to the right side.  Plantarflexed deformity to the metatarsal noted leading to submetatarsal 1 hyperkeratotic lesion Skin: No Porokeratosis. No infection or ulcers  Assessment & Plan:  Patient was evaluated and treated and all questions answered.  Right first metatarsophalangeal joint arthritis/capsulitis -Resolved with a steroid injection.  No further injections are warranted as the pain has resolved.  If it continues to get worse or returns we will consider doing orthotics management with Morton's extension. -Patient is functioning well with orthotics  Plantar fat pad atrophy -I explained to patient the etiology of plantar fat pad and various treatment options were discussed.  Given that patient does not have any subcutaneous tissue  in the ball of the foot is leading to her walking on her bone and therefore causing metatarsalgia-like pain.  I discussed this with patient in extensive detail.  She states understanding.  She does not want any kind of padding offloading.  Onychomycosis with pain  -Nails palliatively debrided as below. -Educated on self-care  Procedure: Nail Debridement Rationale: pain  Type of Debridement: manual, sharp debridement. Instrumentation: Nail nipper, rotary burr. Number of Nails: 10  Procedures and Treatment: Consent by patient was obtained for treatment procedures. The patient understood the discussion of treatment and procedures well. All questions were answered thoroughly reviewed. Debridement of mycotic and hypertrophic toenails, 1 through 5 bilateral and clearing of subungual debris. No ulceration, no infection noted.  Return Visit-Office Procedure: Patient instructed to return to the office for a follow up visit 3 months for continued evaluation and treatment.  Paralee Pendergrass, DPM    No follow-ups on file.  

## 2020-08-27 DIAGNOSIS — H401133 Primary open-angle glaucoma, bilateral, severe stage: Secondary | ICD-10-CM | POA: Diagnosis not present

## 2020-08-29 ENCOUNTER — Ambulatory Visit: Payer: PPO | Admitting: Podiatry

## 2020-10-03 ENCOUNTER — Other Ambulatory Visit: Payer: Self-pay | Admitting: Family Medicine

## 2020-10-10 ENCOUNTER — Other Ambulatory Visit: Payer: Self-pay | Admitting: Family Medicine

## 2020-10-17 ENCOUNTER — Other Ambulatory Visit: Payer: Self-pay | Admitting: Cardiology

## 2020-10-17 NOTE — Telephone Encounter (Signed)
Pt last saw Dr Curt Bears 05/08/20, last labs 07/05/20 Creat 1.08, age 87, weight 43.7kg, based on specified criteria pt is on appropriate dosage of Eliquis 2.5mg  BID.  Will refill rx.

## 2020-10-19 ENCOUNTER — Ambulatory Visit (INDEPENDENT_AMBULATORY_CARE_PROVIDER_SITE_OTHER): Payer: PPO

## 2020-10-19 DIAGNOSIS — I4819 Other persistent atrial fibrillation: Secondary | ICD-10-CM

## 2020-10-23 LAB — CUP PACEART REMOTE DEVICE CHECK
Battery Remaining Longevity: 14 mo
Battery Voltage: 2.88 V
Brady Statistic AP VP Percent: 7.69 %
Brady Statistic AP VS Percent: 4.64 %
Brady Statistic AS VP Percent: 62.94 %
Brady Statistic AS VS Percent: 24.73 %
Brady Statistic RA Percent Paced: 10.53 %
Brady Statistic RV Percent Paced: 71.07 %
Date Time Interrogation Session: 20220415155118
HighPow Impedance: 49 Ohm
Implantable Lead Implant Date: 20070502
Implantable Lead Implant Date: 20150205
Implantable Lead Location: 753859
Implantable Lead Location: 753860
Implantable Lead Special Function: 2015
Implantable Pulse Generator Implant Date: 20150205
Lead Channel Impedance Value: 285 Ohm
Lead Channel Impedance Value: 342 Ohm
Lead Channel Impedance Value: 456 Ohm
Lead Channel Pacing Threshold Amplitude: 0.625 V
Lead Channel Pacing Threshold Amplitude: 0.75 V
Lead Channel Pacing Threshold Pulse Width: 0.4 ms
Lead Channel Pacing Threshold Pulse Width: 0.4 ms
Lead Channel Sensing Intrinsic Amplitude: 1.25 mV
Lead Channel Sensing Intrinsic Amplitude: 1.25 mV
Lead Channel Sensing Intrinsic Amplitude: 9.875 mV
Lead Channel Sensing Intrinsic Amplitude: 9.875 mV
Lead Channel Setting Pacing Amplitude: 1.75 V
Lead Channel Setting Pacing Amplitude: 2 V
Lead Channel Setting Pacing Pulse Width: 0.4 ms
Lead Channel Setting Sensing Sensitivity: 0.3 mV

## 2020-10-24 ENCOUNTER — Other Ambulatory Visit: Payer: Self-pay

## 2020-10-24 ENCOUNTER — Other Ambulatory Visit: Payer: Self-pay | Admitting: Family Medicine

## 2020-10-27 ENCOUNTER — Other Ambulatory Visit: Payer: Self-pay | Admitting: Family Medicine

## 2020-11-01 ENCOUNTER — Ambulatory Visit (INDEPENDENT_AMBULATORY_CARE_PROVIDER_SITE_OTHER): Payer: PPO

## 2020-11-01 DIAGNOSIS — Z0000 Encounter for general adult medical examination without abnormal findings: Secondary | ICD-10-CM | POA: Diagnosis not present

## 2020-11-01 NOTE — Progress Notes (Signed)
Virtual Visit via Telephone Note  I connected with  Victoriah Gilvin on 11/01/20 at  2:30 PM EDT by telephone and verified that I am speaking with the correct person using two identifiers.  Medicare Annual Wellness visit completed telephonically due to Covid-19 pandemic.   Persons participating in this call: This Health Coach and this patient and daughter [name]   Location: Patient: Home Provider: Office    I discussed the limitations, risks, security and privacy concerns of performing an evaluation and management service by telephone and the availability of in person appointments. The patient expressed understanding and agreed to proceed.  Unable to perform video visit due to video visit attempted and failed and/or patient does not have video capability.   Some vital signs may be absent or patient reported.   Willette Brace, LPN    Subjective:   Joss Shobe is a 87 y.o. female who presents for Medicare Annual (Subsequent) preventive examination.  Review of Systems     Cardiac Risk Factors include: advanced age (>64men, >25 women);diabetes mellitus;hypertension;dyslipidemia     Objective:    There were no vitals filed for this visit. There is no height or weight on file to calculate BMI.  Advanced Directives 11/01/2020 09/22/2019  Does Patient Have a Medical Advance Directive? Yes Yes  Type of Advance Directive Healthcare Power of [place]  Does patient want to make changes to medical advance directive? - No - Patient declined  Copy of [place] in Chart? Yes - validated most recent copy scanned in chart (See row information) Yes - validated most recent copy scanned in chart (See row information)    Current Medications (verified) Outpatient Encounter Medications as of 11/01/2020  Medication Sig  . acetaminophen (TYLENOL) 500 MG tablet Take 500 mg by mouth in the morning and at bedtime.   . bimatoprost (LUMIGAN) 0.01 % SOLN  Place 1 drop into both eyes at bedtime.  . clopidogrel (PLAVIX) 75 MG tablet TAKE 1 TABLET BY MOUTH EVERY DAY  . ELIQUIS 2.5 MG TABS tablet TAKE 1 TABLET BY MOUTH TWICE A DAY  . ferrous sulfate 325 (65 FE) MG tablet Take 325 mg by mouth daily with breakfast.   . glipiZIDE (GLUCOTROL XL) 2.5 MG 24 hr tablet TAKE 1 TABLET BY MOUTH DAILY WITH BREAKFAST.  . Melatonin 3 MG CAPS Take 3 mg by mouth at bedtime.   . metoprolol tartrate (LOPRESSOR) 50 MG tablet TAKE 1 TABLET BY MOUTH TWICE A DAY  . Netarsudil Dimesylate (RHOPRESSA) 0.02 % SOLN Apply to eye. 1 gtt both eyes q hs  . pravastatin (PRAVACHOL) 80 MG tablet TAKE 1 TABLET BY MOUTH EVERY DAY  . ramipril (ALTACE) 2.5 MG capsule Take 1 capsule (2.5 mg total) by mouth daily.   No facility-administered encounter medications on file as of 11/01/2020.    Allergies (verified) Iodine, Erythromycin, Penicillins, and Sulfa antibiotics   History: Past Medical History:  Diagnosis Date  . AICD (automatic cardioverter/defibrillator) present   . Arrhythmia   . Arthritis   . Atherosclerosis of right carotid artery 04/01/2019   80% blockage. Last ultrasound in 12/2018. Saw vascular surgery in June 2020. Put on DPT in June (asa added by Psychologist, sport and exercise)  . Benign essential HTN 04/01/2019  . CAD (coronary artery disease) 04/01/2019  . Cardiac arrhythmia due to congenital heart disease   . CHF (congestive heart failure) ([place]) 04/01/2019  . Chronic kidney disease (CKD), stage III (moderate) ([place]) 04/04/2019  . Diabetes ([place])   .  Glaucoma   . Heart murmur   . Hx of CABG 04/01/2019   Several years ago per family   . Hyperlipemia   . Migraines   . Pacemaker   . Paroxysmal A-fib ([place]) 04/01/2019   warafin in the remote past, but changed to plavix. CHADS2 score of 8%. High fall risk...   . PVD (peripheral vascular disease) ([place]) 04/01/2019  . Type 2 diabetes mellitus with diabetic chronic kidney disease ([place]) 04/01/2019  . Urine incontinence   . Valvular heart disease  04/01/2019   Mitral and tricuspid annuloplasty    Past Surgical History:  Procedure Laterality Date  . ABDOMINAL HYSTERECTOMY    . BACK SURGERY  1980  . CARDIAC DEFIBRILLATOR PLACEMENT    . CORONARY ARTERY BYPASS GRAFT    . HIP FRACTURE SURGERY  2015  . PPM GENERATOR CHANGEOUT     Family History  Problem Relation Age of Onset  . Cancer Father   . Cancer Sister   . Cancer Daughter    Social History   Socioeconomic History  . Marital status: Married    Spouse name: Not on file  . Number of children: 2  . Years of education: Not on file  . Highest education level: Not on file  Occupational History  . Occupation: Retired   Tobacco Use  . Smoking status: Never Smoker  . Smokeless tobacco: Never Used  Vaping Use  . Vaping Use: Never used  Substance and Sexual Activity  . Alcohol use: Not Currently  . Drug use: Never  . Sexual activity: Not Currently  Other Topics Concern  . Not on file  Social History Narrative   Lives with spouse   Does not drive    Daughter bring meals and transports   [employer] on wheels    Social Determinants of Health   Financial Resource Strain: Low Risk   . Difficulty of Paying Living Expenses: Not hard at all  Food Insecurity: No Food Insecurity  . Worried About Charity fundraiser in the Last Year: Never true  . Ran Out of Food in the Last Year: Never true  Transportation Needs: No Transportation Needs  . Lack of Transportation (Medical): No  . Lack of Transportation (Non-Medical): No  Physical Activity: Inactive  . Days of Exercise per Week: 0 days  . Minutes of Exercise per Session: 0 min  Stress: No Stress Concern Present  . Feeling of Stress : Not at all  Social Connections: Moderately Isolated  . Frequency of Communication with Friends and Family: More than three times a week  . Frequency of Social Gatherings with Friends and Family: Once a week  . Attends Religious Services: Never  . Active Member of Clubs or Organizations:  No  . Attends Archivist Meetings: Never  . Marital Status: Married    Tobacco Counseling Counseling given: Not Answered   Clinical Intake:  Pre-visit preparation completed: Yes  Pain : No/denies pain     BMI - recorded: 16.55 Nutritional Status: BMI <19  Underweight Nutritional Risks: None Diabetes: Yes CBG done?: No Did pt. bring in CBG monitor from home?: No  How often do you need to have someone help you when you read instructions, pamphlets, or other written materials from your doctor or pharmacy?: 1 - Never  Diabetic?Nutrition Risk Assessment:  Has the patient had any N/V/D within the last 2 months?  No  Does the patient have any non-healing wounds?  No  Has the patient had any  unintentional weight loss or weight gain?  No   Diabetes:  Is the patient diabetic?  Yes  If diabetic, was a CBG obtained today?  No  Did the patient bring in their glucometer from home?  No  How often do you monitor your CBG's? N/A.   Financial Strains and Diabetes Management:  Are you having any financial strains with the device, your supplies or your medication? No .  Does the patient want to be seen by Chronic Care Management for management of their diabetes?  No  Would the patient like to be referred to a Nutritionist or for Diabetic Management?  No   Diabetic Exams:  Diabetic Eye Exam: Overdue for diabetic eye exam. Pt has been advised about the importance in completing this exam. Patient advised to call and schedule an eye exam. Diabetic Foot Exam: Overdue, Pt has been advised about the importance in completing this exam. Pt is scheduled for diabetic foot exam on next appt .01/04/21   Interpreter Needed?: No  Information entered by :: Charlott Rakes, LPN   Activities of Daily Living In your present state of health, do you have any difficulty performing the following activities: 11/01/2020  Hearing? Y  Comment HOH  Vision? Y  Difficulty concentrating or making  decisions? Y  Comment memory  Walking or climbing stairs? Y  Dressing or bathing? N  Doing errands, shopping? N  Preparing Food and eating ? N  Using the Toilet? N  In the past six months, have you accidently leaked urine? Y  Comment wears a brief  Do you have problems with loss of bowel control? N  Managing your Medications? N  Managing your Finances? N  Housekeeping or managing your Housekeeping? N  Some recent data might be hidden    Patient Care Team: Orma Flaming, MD as PCP - General (Family Medicine) Constance Haw, MD as PCP - Electrophysiology (Cardiology) Katy Apo, MD as Consulting Physician (Ophthalmology) Josue Hector, MD as Consulting Physician (Cardiology) Felipa Furnace, DPM as Consulting Physician (Podiatry)  Indicate any recent Medical Services you may have received from other than [facility] providers in the past year (date may be approximate).     Assessment:   This is a routine wellness examination for [place].  Hearing/Vision screen  Hearing Screening   125Hz  250Hz  500Hz  1000Hz  2000Hz  3000Hz  4000Hz  6000Hz  8000Hz   Right ear:           Left ear:           Comments: Pt HOH   Vision Screening Comments: Pt follows up with Dr Prudencio Burly for annual eye exams   Dietary issues and exercise activities discussed: Current Exercise Habits: The patient does not participate in regular exercise at present  Goals    . Patient Stated     None at this time       Depression Screen PHQ 2/9 Scores 11/01/2020 07/05/2020 04/26/2020 01/04/2020 09/22/2019 04/01/2019  PHQ - 2 Score 0 0 0 0 0 3  PHQ- 9 Score - - - - - 9    Fall Risk Fall Risk  11/01/2020 01/04/2020 09/22/2019  Falls in the past year? 1 0 0  Number falls in past yr: 1 - 0  Injury with Fall? 0 - 0  Risk for fall due to : Impaired mobility;Impaired balance/gait - -  Follow up Falls prevention discussed - Falls evaluation completed;Education provided;Falls prevention discussed    FALL RISK PREVENTION  PERTAINING TO THE HOME:  Any stairs in or  around the home? Yes  If so, are there any without handrails? No  Home free of loose throw rugs in walkways, pet beds, electrical cords, etc? Yes  Adequate lighting in your home to reduce risk of falls? Yes   ASSISTIVE DEVICES UTILIZED TO PREVENT FALLS:  Life alert? No  Use of a cane, walker or w/c? Yes  Grab bars in the bathroom? Yes  Shower chair or bench in shower? Yes  Elevated toilet seat or a handicapped toilet? No   TIMED UP AND GO:  Was the test performed? No     Cognitive Function:     6CIT Screen 11/01/2020 09/22/2019  What Year? 0 points 0 points  What month? 0 points 0 points  What time? - 0 points  Count back from 20 0 points 0 points  Months in reverse 0 points 0 points  Repeat phrase 4 points 0 points  Total Score - 0    Immunizations Immunization History  Administered Date(s) Administered  . Fluad Quad(high Dose 65+) 04/01/2019, 04/05/2020  . PFIZER(Purple Top)SARS-COV-2 Vaccination 08/21/2019, 09/13/2019, 05/02/2020  . Pneumococcal Conjugate-13 07/04/2019    TDAP status: Due, Education has been provided regarding the importance of this vaccine. Advised may receive this vaccine at local pharmacy or Health Dept. Aware to provide a copy of the vaccination record if obtained from local pharmacy or Health Dept. Verbalized acceptance and understanding.  Flu Vaccine status: Up to date  Pneumococcal vaccine status: Due, Education has been provided regarding the importance of this vaccine. Advised may receive this vaccine at local pharmacy or Health Dept. Aware to provide a copy of the vaccination record if obtained from local pharmacy or Health Dept. Verbalized acceptance and understanding.  Covid-19 vaccine status: Completed vaccines  Qualifies for Shingles Vaccine? Yes   Zostavax completed No   Shingrix Completed?: No.    Education has been provided regarding the importance of this vaccine. Patient has been advised  to call insurance company to determine out of pocket expense if they have not yet received this vaccine. Advised may also receive vaccine at local pharmacy or Health Dept. Verbalized acceptance and understanding.  Screening Tests Health Maintenance  Topic Date Due  . OPHTHALMOLOGY EXAM  Never done  . TETANUS/TDAP  Never done  . DEXA SCAN  Never done  . FOOT EXAM  05/01/2020  . PNA vac Low Risk Adult (2 of 2 - PPSV23) 07/03/2020  . HEMOGLOBIN A1C  01/03/2021  . INFLUENZA VACCINE  02/04/2021  . COVID-19 Vaccine  Completed  . HPV VACCINES  Aged Out    Health Maintenance  Health Maintenance Due  Topic Date Due  . OPHTHALMOLOGY EXAM  Never done  . TETANUS/TDAP  Never done  . DEXA SCAN  Never done  . FOOT EXAM  05/01/2020  . PNA vac Low Risk Adult (2 of 2 - PPSV23) 07/03/2020    Colorectal cancer screening: No longer required.   Mammogram status: No longer required due to age.     Additional Screening:   Vision Screening: Recommended annual ophthalmology exams for early detection of glaucoma and other disorders of the eye. Is the patient up to date with their annual eye exam?  Yes  Who is the provider or what is the name of the office in which the patient attends annual eye exams? Dr Prudencio Burly every 6 months If pt is not established with a provider, would they like to be referred to a provider to establish care? No .   Dental Screening:  Recommended annual dental exams for proper oral hygiene  Community Resource Referral / Chronic Care Management: CRR required this visit?  No   CCM required this visit?  No      Plan:     I have personally reviewed and noted the following in the patient's chart:   . Medical and social history . Use of alcohol, tobacco or illicit drugs  . Current medications and supplements . Functional ability and status . Nutritional status . Physical activity . Advanced directives . List of other physicians . Hospitalizations, surgeries, and ER  visits in previous 12 months . Vitals . Screenings to include cognitive, depression, and falls . Referrals and appointments  In addition, I have reviewed and discussed with patient certain preventive protocols, quality metrics, and best practice recommendations. A written personalized care plan for preventive services as well as general preventive health recommendations were provided to patient.     Willette Brace, LPN   [id number]   Nurse Notes: None

## 2020-11-01 NOTE — Patient Instructions (Signed)
Amy Wolf , Thank you for taking time to come for your Medicare Wellness Visit. I appreciate your ongoing commitment to your health goals. Please review the following plan we discussed and let me know if I can assist you in the future.   Screening recommendations/referrals: Colonoscopy: No longer required  Mammogram: No longer required  Bone Density: Due  Recommended yearly ophthalmology/optometry visit for glaucoma screening and checkup Recommended yearly dental visit for hygiene and checkup  Vaccinations: Influenza vaccine: Up to date Pneumococcal vaccine: Due and discussed Tdap vaccine: Due and discussed Shingles vaccine: Shingrix discussed. Please contact your pharmacy for coverage information.    Covid-19:Completed 2/14, 3/9, & 05/02/20  Advanced directives: Copies in chart  Conditions/risks identified: none at this time   Next appointment: Follow up in one year for your annual wellness visit    Preventive Care 65 Years and Older, Female Preventive care refers to lifestyle choices and visits with your health care provider that can promote health and wellness. What does preventive care include?  A yearly physical exam. This is also called an annual well check.  Dental exams once or twice a year.  Routine eye exams. Ask your health care provider how often you should have your eyes checked.  Personal lifestyle choices, including:  Daily care of your teeth and gums.  Regular physical activity.  Eating a healthy diet.  Avoiding tobacco and drug use.  Limiting alcohol use.  Practicing safe sex.  Taking low-dose aspirin every day.  Taking vitamin and mineral supplements as recommended by your health care provider. What happens during an annual well check? The services and screenings done by your health care provider during your annual well check will depend on your age, overall health, lifestyle risk factors, and family history of disease. Counseling  Your health  care provider may ask you questions about your:  Alcohol use.  Tobacco use.  Drug use.  Emotional well-being.  Home and relationship well-being.  Sexual activity.  Eating habits.  History of falls.  Memory and ability to understand (cognition).  Work and work Statistician.  Reproductive health. Screening  You may have the following tests or measurements:  Height, weight, and BMI.  Blood pressure.  Lipid and cholesterol levels. These may be checked every 5 years, or more frequently if you are over 32 years old.  Skin check.  Lung cancer screening. You may have this screening every year starting at age 29 if you have a 30-pack-year history of smoking and currently smoke or have quit within the past 15 years.  Fecal occult blood test (FOBT) of the stool. You may have this test every year starting at age 83.  Flexible sigmoidoscopy or colonoscopy. You may have a sigmoidoscopy every 5 years or a colonoscopy every 10 years starting at age 63.  Hepatitis C blood test.  Hepatitis B blood test.  Sexually transmitted disease (STD) testing.  Diabetes screening. This is done by checking your blood sugar (glucose) after you have not eaten for a while (fasting). You may have this done every 1-3 years.  Bone density scan. This is done to screen for osteoporosis. You may have this done starting at age 63.  Mammogram. This may be done every 1-2 years. Talk to your health care provider about how often you should have regular mammograms. Talk with your health care provider about your test results, treatment options, and if necessary, the need for more tests. Vaccines  Your health care provider may recommend certain vaccines, such as:  Influenza vaccine. This is recommended every year.  Tetanus, diphtheria, and acellular pertussis (Tdap, Td) vaccine. You may need a Td booster every 10 years.  Zoster vaccine. You may need this after age 19.  Pneumococcal 13-valent conjugate  (PCV13) vaccine. One dose is recommended after age 33.  Pneumococcal polysaccharide (PPSV23) vaccine. One dose is recommended after age 84. Talk to your health care provider about which screenings and vaccines you need and how often you need them. This information is not intended to replace advice given to you by your health care provider. Make sure you discuss any questions you have with your health care provider. Document Released: 07/20/2015 Document Revised: 03/12/2016 Document Reviewed: 04/24/2015 Elsevier Interactive Patient Education  2017 [employer] Prevention in the Home Falls can cause injuries. They can happen to people of all ages. There are many things you can do to make your home safe and to help prevent falls. What can I do on the outside of my home?  Regularly fix the edges of walkways and driveways and fix any cracks.  Remove anything that might make you trip as you walk through a door, such as a raised step or threshold.  Trim any bushes or trees on the path to your home.  Use bright outdoor lighting.  Clear any walking paths of anything that might make someone trip, such as rocks or tools.  Regularly check to see if handrails are loose or broken. Make sure that both sides of any steps have handrails.  Any raised decks and porches should have guardrails on the edges.  Have any leaves, snow, or ice cleared regularly.  Use sand or salt on walking paths during winter.  Clean up any spills in your garage right away. This includes oil or grease spills. What can I do in the bathroom?  Use night lights.  Install grab bars by the toilet and in the tub and shower. Do not use towel bars as grab bars.  Use non-skid mats or decals in the tub or shower.  If you need to sit down in the shower, use a plastic, non-slip stool.  Keep the floor dry. Clean up any water that spills on the floor as soon as it happens.  Remove soap buildup in the tub or shower  regularly.  Attach bath mats securely with double-sided non-slip rug tape.  Do not have throw rugs and other things on the floor that can make you trip. What can I do in the bedroom?  Use night lights.  Make sure that you have a light by your bed that is easy to reach.  Do not use any sheets or blankets that are too big for your bed. They should not hang down onto the floor.  Have a firm chair that has side arms. You can use this for support while you get dressed.  Do not have throw rugs and other things on the floor that can make you trip. What can I do in the kitchen?  Clean up any spills right away.  Avoid walking on wet floors.  Keep items that you use a lot in easy-to-reach places.  If you need to reach something above you, use a strong step stool that has a grab bar.  Keep electrical cords out of the way.  Do not use floor polish or wax that makes floors slippery. If you must use wax, use non-skid floor wax.  Do not have throw rugs and other things on the floor that  can make you trip. What can I do with my stairs?  Do not leave any items on the stairs.  Make sure that there are handrails on both sides of the stairs and use them. Fix handrails that are broken or loose. Make sure that handrails are as long as the stairways.  Check any carpeting to make sure that it is firmly attached to the stairs. Fix any carpet that is loose or worn.  Avoid having throw rugs at the top or bottom of the stairs. If you do have throw rugs, attach them to the floor with carpet tape.  Make sure that you have a light switch at the top of the stairs and the bottom of the stairs. If you do not have them, ask someone to add them for you. What else can I do to help prevent falls?  Wear shoes that:  Do not have high heels.  Have rubber bottoms.  Are comfortable and fit you well.  Are closed at the toe. Do not wear sandals.  If you use a stepladder:  Make sure that it is fully  opened. Do not climb a closed stepladder.  Make sure that both sides of the stepladder are locked into place.  Ask someone to hold it for you, if possible.  Clearly mark and make sure that you can see:  Any grab bars or handrails.  First and last steps.  Where the edge of each step is.  Use tools that help you move around (mobility aids) if they are needed. These include:  Canes.  Walkers.  Scooters.  Crutches.  Turn on the lights when you go into a dark area. Replace any light bulbs as soon as they burn out.  Set up your furniture so you have a clear path. Avoid moving your furniture around.  If any of your floors are uneven, fix them.  If there are any pets around you, be aware of where they are.  Review your medicines with your doctor. Some medicines can make you feel dizzy. This can increase your chance of falling. Ask your doctor what other things that you can do to help prevent falls. This information is not intended to replace advice given to you by your health care provider. Make sure you discuss any questions you have with your health care provider. Document Released: 04/19/2009 Document Revised: 11/29/2015 Document Reviewed: 07/28/2014 Elsevier Interactive Patient Education  2017 [employer].

## 2020-11-06 NOTE — Progress Notes (Signed)
Remote ICD transmission.   

## 2020-11-25 NOTE — Progress Notes (Deleted)
Cardiology Office Note   Date:  11/25/2020   ID:  Amy Wolf, Amy Wolf Jul 21, 1927, MRN [id number]  PCP:  Orma Flaming, MD  Cardiologist: Dr. Admission, MD  No chief complaint on file.     History of Present Illness: Amy Wolf is a 87 y.o. female who presents for follow-up, seen for Dr.  Ms. Wolf has a history of hypertension, DM2, CAD status post CABG with mitral and tricuspid angioplasty rings, ischemic cardiomyopathy with Medtronic ICD placement, paroxysmal atrial fibrillation not on anticoagulation due to frequent falls, anemia and carotid artery disease.  She was most recently seen by Dr. Curt Wolf in EP follow-up 05/08/2020 at which time she was doing well from a CV standpoint.   1.  Coronary artery disease: Status post CABG.  No current chest pain.  2.  Valvular heart disease: Status post tricuspid and mitral valve annuloplasty.  Echo is stable results some leak but not severe on last echo.  Plan per primary cardiology. Repeat echocardiogram?  3.  Chronic systolic heart failure: Likely due to ischemic cardiomyopathy.  Status post Medtronic ICD.  Device functioning appropriately.  No changes.  Ice interrogation 10/19/2020 that showed persistent atrial fibrillation, normal remote review  4.  Carotid artery disease: Has been referred to vascular surgery.  5.  Hypertension: Currently well controlled.  She is on Norvasc currently.  We will stop it at this time.  6.  Permanent atrial fibrillation:   On Eliquis and Plavix.  CHA2DS2-VASc of 8.  Unaware of her atrial fibrillation.  No changes.   Past Medical History:  Diagnosis Date  . AICD (automatic cardioverter/defibrillator) present   . Arrhythmia   . Arthritis   . Atherosclerosis of right carotid artery 04/01/2019   80% blockage. Last ultrasound in 12/2018. Saw vascular surgery in June 2020. Put on DPT in June (asa added by Psychologist, sport and exercise)  . Benign essential HTN 04/01/2019  . CAD (coronary artery disease) 04/01/2019  . Cardiac  arrhythmia due to congenital heart disease   . CHF (congestive heart failure) ([place]) 04/01/2019  . Chronic kidney disease (CKD), stage III (moderate) ([place]) 04/04/2019  . Diabetes ([place])   . Glaucoma   . Heart murmur   . Hx of CABG 04/01/2019   Several years ago per family   . Hyperlipemia   . Migraines   . Pacemaker   . Paroxysmal A-fib ([place]) 04/01/2019   warafin in the remote past, but changed to plavix. CHADS2 score of 8%. High fall risk...   . PVD (peripheral vascular disease) ([place]) 04/01/2019  . Type 2 diabetes mellitus with diabetic chronic kidney disease ([place]) 04/01/2019  . Urine incontinence   . Valvular heart disease 04/01/2019   Mitral and tricuspid annuloplasty     Past Surgical History:  Procedure Laterality Date  . ABDOMINAL HYSTERECTOMY    . BACK SURGERY  1980  . CARDIAC DEFIBRILLATOR PLACEMENT    . CORONARY ARTERY BYPASS GRAFT    . HIP FRACTURE SURGERY  2015  . PPM GENERATOR CHANGEOUT       Current Outpatient Medications  Medication Sig Dispense Refill  . acetaminophen (TYLENOL) 500 MG tablet Take 500 mg by mouth in the morning and at bedtime.     . bimatoprost (LUMIGAN) 0.01 % SOLN Place 1 drop into both eyes at bedtime.    . clopidogrel (PLAVIX) 75 MG tablet TAKE 1 TABLET BY MOUTH EVERY DAY 90 tablet 0  . ELIQUIS 2.5 MG TABS tablet TAKE 1 TABLET BY MOUTH TWICE A DAY 60  tablet 5  . ferrous sulfate 325 (65 FE) MG tablet Take 325 mg by mouth daily with breakfast.     . glipiZIDE (GLUCOTROL XL) 2.5 MG 24 hr tablet TAKE 1 TABLET BY MOUTH DAILY WITH BREAKFAST. 90 tablet 1  . Melatonin 3 MG CAPS Take 3 mg by mouth at bedtime.     . metoprolol tartrate (LOPRESSOR) 50 MG tablet TAKE 1 TABLET BY MOUTH TWICE A DAY 270 tablet 0  . Netarsudil Dimesylate (RHOPRESSA) 0.02 % SOLN Apply to eye. 1 gtt both eyes q hs    . pravastatin (PRAVACHOL) 80 MG tablet TAKE 1 TABLET BY MOUTH EVERY DAY 90 tablet 0  . ramipril (ALTACE) 2.5 MG capsule Take 1 capsule (2.5 mg total) by mouth daily. 90  capsule 3   No current facility-administered medications for this visit.    Allergies:   Iodine, Erythromycin, Penicillins, and Sulfa antibiotics    Social History:  The patient  reports that she has never smoked. She has never used smokeless tobacco. She reports previous alcohol use. She reports that she does not use drugs.   Family History:  The patient's ***family history includes Cancer in her daughter, father, and sister.    ROS:  Please see the history of present illness.   Otherwise, review of systems are positive for {NONE DEFAULTED:18576::"none"}.   All other systems are reviewed and negative.    PHYSICAL EXAM: VS:  There were no vitals taken for this visit. , BMI There is no height or weight on file to calculate BMI. GEN: Well nourished, well developed, in no acute distress HEENT: normal Neck: no JVD, carotid bruits, or masses Cardiac: ***RRR; no murmurs, rubs, or gallops,no edema  Respiratory:  clear to auscultation bilaterally, normal work of breathing GI: soft, nontender, nondistended, + BS MS: no deformity or atrophy Skin: warm and dry, no rash Neuro:  Strength and sensation are intact Psych: euthymic mood, full affect   EKG:  EKG {ACTION; IS/IS EPP:29518841} ordered today. The ekg ordered today demonstrates ***   Recent Labs: 04/26/2020: Magnesium 2.2; TSH 2.41 07/05/2020: ALT 14; BUN 28; Creatinine, Ser 1.08; Hemoglobin 11.4; Platelets 235.0; Potassium 4.0; Sodium 140    Lipid Panel    Component Value Date/Time   CHOL 170 04/01/2019 1133   TRIG 87.0 04/01/2019 1133   HDL 63.80 04/01/2019 1133   CHOLHDL 3 04/01/2019 1133   VLDL 17.4 04/01/2019 1133   LDLCALC 89 04/01/2019 1133      Wt Readings from Last 3 Encounters:  07/05/20 96 lb 6.4 oz (43.7 kg)  05/08/20 99 lb (44.9 kg)  04/26/20 99 lb 6.4 oz (45.1 kg)      Other studies Reviewed: Additional studies/ records that were reviewed today include: ***. Review of the above records demonstrates:  ***  Echocardiogram 04/12/2019:    1. Left ventricular ejection fraction, by visual estimation, is 30%. The  left ventricle has normal function. Normal left ventricular size. There is  mildly increased left ventricular hypertrophy. Septal-lateral dyssynchrony  with diffuse hypokinesis.  2. Left ventricular diastolic Doppler parameters are indeterminate  pattern of LV diastolic filling.  3. S/p tricuspid valve repair. No more than moderate tricuspid  regurgitation visually though thick CW doppler jet raises concern for more  significant tricuspid regurgitation.  4. S/p mitral valve repair. No more than moderate mitral regurgitation  visually, though thick CW doppler jet raises concern for more significant  mitral regurgitation. Mean gradient 3 mmHg across the mitral valve, no  evidence for  significant stenosis.  5. The aortic valve is tricuspid Aortic valve regurgitation is trivial by  color flow Doppler. Mild aortic valve sclerosis without stenosis.  6. Global right ventricle has mildly reduced systolic function.The right  ventricular size is mildly enlarged. No increase in right ventricular wall  thickness.  7. A pacer wire is visualized in the RV.  8. The tricuspid valve is normal in structure. Tricuspid valve  regurgitation is mild.  9. Right atrial size was moderately dilated.  10. Left atrial size was severely dilated.  11. The inferior vena cava is dilated in size with >50% respiratory  variability, suggesting right atrial pressure of 8 mmHg.  12. The tricuspid regurgitant velocity is 3.35 m/s, and with an assumed  right atrial pressure of 8 mmHg, the estimated right ventricular systolic  pressure is moderately elevated at 52.9 mmHg.     ASSESSMENT AND PLAN:  1.  ***   Current medicines are reviewed at length with the patient today.  The patient {ACTIONS; HAS/DOES NOT HAVE:19233} concerns regarding medicines.  The following changes have been made:  {PLAN; NO  CHANGE:13088:s}  Labs/ tests ordered today include: *** No orders of the defined types were placed in this encounter.    Disposition:   FU with *** in {gen number 7-26:203559} {Days to years:10300}  Signed, Kathyrn Drown, NP  11/25/2020 4:03 PM    [facility] [place], [place], [place]  [address] Phone: [phone]; Fax: [phone]

## 2020-11-28 ENCOUNTER — Ambulatory Visit: Payer: PPO | Admitting: Cardiology

## 2020-12-30 ENCOUNTER — Other Ambulatory Visit: Payer: Self-pay | Admitting: Family Medicine

## 2021-01-01 ENCOUNTER — Other Ambulatory Visit: Payer: Self-pay

## 2021-01-01 ENCOUNTER — Encounter: Payer: Self-pay | Admitting: Family

## 2021-01-01 ENCOUNTER — Other Ambulatory Visit: Payer: Self-pay | Admitting: Family Medicine

## 2021-01-01 ENCOUNTER — Ambulatory Visit: Payer: PPO | Admitting: Family

## 2021-01-01 VITALS — BP 130/80 | HR 61 | Ht 64.0 in | Wt 97.2 lb

## 2021-01-01 DIAGNOSIS — I6521 Occlusion and stenosis of right carotid artery: Secondary | ICD-10-CM | POA: Diagnosis not present

## 2021-01-01 DIAGNOSIS — Z7901 Long term (current) use of anticoagulants: Secondary | ICD-10-CM | POA: Diagnosis not present

## 2021-01-01 DIAGNOSIS — I4821 Permanent atrial fibrillation: Secondary | ICD-10-CM | POA: Diagnosis not present

## 2021-01-01 DIAGNOSIS — E785 Hyperlipidemia, unspecified: Secondary | ICD-10-CM

## 2021-01-01 DIAGNOSIS — Z95810 Presence of automatic (implantable) cardiac defibrillator: Secondary | ICD-10-CM

## 2021-01-01 DIAGNOSIS — I255 Ischemic cardiomyopathy: Secondary | ICD-10-CM | POA: Diagnosis not present

## 2021-01-01 DIAGNOSIS — I10 Essential (primary) hypertension: Secondary | ICD-10-CM | POA: Diagnosis not present

## 2021-01-01 DIAGNOSIS — I25118 Atherosclerotic heart disease of native coronary artery with other forms of angina pectoris: Secondary | ICD-10-CM | POA: Diagnosis not present

## 2021-01-01 MED ORDER — METOPROLOL TARTRATE 50 MG PO TABS: 50.0000 mg | ORAL_TABLET | Freq: Two times a day (BID) | ORAL | 3 refills | Status: DC

## 2021-01-01 NOTE — Progress Notes (Signed)
Office Visit    Patient Name: Amy Wolf Date of Encounter: 01/01/2021  PCP:  Orma Flaming, [facility]  Cardiologist:  Jenkins Rouge, MD  Advanced Practice Provider:  No care team member to display Electrophysiologist:  Will Meredith Leeds, MD    Chief Complaint    Amy Wolf is a 87 y.o. female with a hx of hypertension, diabetes, coronary artery disease s/p CABG with mitral and tricuspid annuloplasty rings at that time, ischemic cardiomyopathy with Medtronic ICD, paroxysmal atrial fibrillation, frequent falls, anemia, carotid artery disease presents today for follow-up of hypertension, CAD, ischemic cardiomyopathy  Past Medical History    Past Medical History:  Diagnosis Date   AICD (automatic cardioverter/defibrillator) present    Arrhythmia    Arthritis    Atherosclerosis of right carotid artery 04/01/2019   80% blockage. Last ultrasound in 12/2018. Saw vascular surgery in June 2020. 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 CHADS2 score of 8%. High fall risk...    PVD (peripheral vascular disease) ([place]) 04/01/2019   Type 2 diabetes mellitus with diabetic chronic kidney disease ([place]) 04/01/2019   Urine incontinence    Valvular heart disease 04/01/2019   Mitral and tricuspid annuloplasty    Past Surgical History:  Procedure Laterality Date   ABDOMINAL HYSTERECTOMY     BACK SURGERY  1980   CARDIAC DEFIBRILLATOR PLACEMENT     CORONARY ARTERY BYPASS GRAFT     HIP  FRACTURE SURGERY  2015   PPM GENERATOR CHANGEOUT      Allergies  Allergies  Allergen Reactions   Iodine Other (See Comments)   Erythromycin Rash   Penicillins Rash   Sulfa Antibiotics Rash    History of Present Illness    Amy Wolf is a 87 y.o. female with a hx of hypertension, diabetes, coronary artery disease s/p CABG with mitral and tricuspid annuloplasty rings at that time, ischemic cardiomyopathy with Medtronic ICD, paroxysmal atrial fibrillation, frequent falls, anemia, carotid artery disease.  She was last seen 05/08/2020 by Dr. Curt Bears.  Echocardiogram October 2020 LVEF 30%, mild LVH, septal lateral dyssynchrony with diffuse hypokinesis, mild TR, moderate MR.   Given frequent falls and anemia at her anticoagulation in the setting of paroxysmal atrial fibrillation has been discontinued previously but able to be resumed.  When last seen she was doing overall well from a cardiac perspective.  She remained in atrial fibrillation but felt better since her Multaq was discontinued. Her Amlodipine was discontinued as her BP was well controlled.   At follow up with PCP 06/2020 her Metoprolol dose was reduced due to hypotension.   Presents today for follow up with her daughter. Mostly sedentary watching TV throughout the day. Daughter notes she "pants" with more than usual activity but when asked is not short of breath. Encouraged to discuss physical therapy for conditioning with primary care provider. Reports no shortness of breath nor  dyspnea on exertion. Reports no chest pain, pressure, or tightness. No edema, orthopnea, PND. Reports no palpitations.   EKGs/Labs/Other Studies Reviewed:   The following studies were reviewed today:  TTE 04/12/19 Review of the above records today demonstrates:  1. Left ventricular ejection fraction, by visual estimation, is 30%. The left ventricle has normal function. Normal left ventricular size. There is mildly increased left ventricular hypertrophy.  Septal-lateral dyssynchrony with diffuse hypokinesis.  2. Left ventricular diastolic Doppler parameters are indeterminate pattern of LV diastolic filling.  3. S/p tricuspid valve repair. No more than moderate tricuspid regurgitation visually though thick CW doppler jet raises concern for more significant tricuspid regurgitation.  4. S/p mitral valve repair. No more than moderate mitral regurgitation visually, though thick CW doppler jet raises concern for more significant mitral regurgitation. Mean gradient 3 mmHg across the mitral valve, no evidence for significant stenosis.  5. The aortic valve is tricuspid Aortic valve regurgitation is trivial by color flow Doppler. Mild aortic valve sclerosis without stenosis.  6. Global right ventricle has mildly reduced systolic function.The right ventricular size is mildly enlarged. No increase in right ventricular wall thickness.  7. A pacer wire is visualized in the RV.  8. The tricuspid valve is normal in structure. Tricuspid valve regurgitation is mild.  9. Right atrial size was moderately dilated. 10. Left atrial size was severely dilated. 11. The inferior vena cava is dilated in size with >50% respiratory variability, suggesting right atrial pressure of 8 mmHg. 12. The tricuspid regurgitant velocity is 3.35 m/s, and with an assumed right atrial pressure of 8 mmHg, the estimated right ventricular systolic pressure is moderately elevated at 52.9 mmHg.  EKG:  EKG is ordered today.  The ekg ordered today demonstrates v-paced rhythm 61 bpm with underlying atrial fibrillation. No acute ST/T wave changes  Recent Labs: 04/26/2020: Magnesium 2.2; TSH 2.41 07/05/2020: ALT 14; BUN 28; Creatinine, Ser 1.08; Hemoglobin 11.4; Platelets 235.0; Potassium 4.0; Sodium 140  Recent Lipid Panel    Component Value Date/Time   CHOL 170 04/01/2019 1133   TRIG 87.0 04/01/2019 1133   HDL 63.80 04/01/2019 1133   CHOLHDL 3 04/01/2019 1133   VLDL 17.4 04/01/2019 1133    LDLCALC 89 04/01/2019 1133    Home Medications   Current Meds  Medication Sig   acetaminophen (TYLENOL) 500 MG tablet Take 500 mg by mouth in the morning and at bedtime.    bimatoprost (LUMIGAN) 0.01 % SOLN Place 1 drop into both eyes at bedtime.   clopidogrel (PLAVIX) 75 MG tablet TAKE 1 TABLET BY MOUTH EVERY DAY   ELIQUIS 2.5 MG TABS tablet TAKE 1 TABLET BY MOUTH TWICE A DAY   ferrous sulfate 325 (65 FE) MG tablet Take 325 mg by mouth daily with breakfast.    glipiZIDE (GLUCOTROL XL) 2.5 MG 24 hr tablet TAKE 1 TABLET BY MOUTH DAILY WITH BREAKFAST.   Melatonin 3 MG CAPS Take 3 mg by mouth at bedtime.    Netarsudil Dimesylate (RHOPRESSA) 0.02 % SOLN Apply to eye. 1 gtt both eyes q hs   pravastatin (PRAVACHOL) 80 MG tablet TAKE 1 TABLET BY MOUTH EVERY DAY   ramipril (ALTACE) 2.5 MG capsule Take 1 capsule (2.5 mg total) by mouth daily.   [DISCONTINUED] metoprolol tartrate (LOPRESSOR) 50 MG tablet TAKE 1 TABLET BY MOUTH TWICE A DAY     Review of Systems   All other systems reviewed and are otherwise negative except as noted above.  Physical Exam    VS:  BP 130/80   Pulse 61   Ht 5\' 4"  (1.626 m)   Wt 97 lb 3.2 oz (44.1 kg)   SpO2 98%   BMI 16.68 kg/m  , BMI Body mass index is 16.68 kg/m.  Wt Readings from Last 3 Encounters:  01/01/21 97 lb 3.2 oz (44.1 kg)  07/05/20 96 lb 6.4 oz (43.7 kg)  05/08/20 99 lb (44.9 kg)     GEN: Frail, well developed, in no acute distress. HEENT: normal. Neck: Supple, no JVD, carotid bruits, or masses. Cardiac: RRR, no murmurs, rubs, or gallops. No clubbing, cyanosis, edema.  Radials/DP/PT 2+ and equal bilaterally.  Respiratory:  Respirations regular and unlabored, clear to auscultation bilaterally. GI: Soft, nontender, nondistended. MS: No deformity or atrophy. Skin: Warm and dry, no rash. Varicose veins noted to bilateral lower extremities.  Neuro:  Strength and sensation are intact. Psych: Normal affect.  Assessment & Plan    CAD s/p  CABG - Stable with no anginal symptoms. No indication for ischemic evaluation. GDMT includes Plavix, Metoprolol, Pravastatin. Heart healthy diet and regular cardiovascular exercise encouraged. Chair exercises handout provided.  Valvular heart disease s/p tricuspid and mitral valve annuloplasty - Stable by echo 04/2019. Continue optimal BP and volume control. No worsening murmur appreciated on exam.   HFrEF / ICM - Euvolemic and well compensated on exam. GDMT includes Metoprolol, Ramipril. No indication for diuretic.   Presence of Medtronic ICD - Continue to follow with EP.  Deconditioning - Encouraged to discuss PT referral with PCP.  Chair exercise handout provided.   Carotid artery disease - Duplex 04/06/20 40-59% RICA stenosis and left 1-39% stenosis. Repeat carotid previously ordered for 04/2021, will schedule.   HTN - BP well controlled. Continue current antihypertensive regimen. No recurrent hypotension since Metoprolol dose reduced.  HLD, LDL goal <70 - Continue Pravasatin per PCP.   Permanent atrial fibrillation / Chronic anticoagulation - 07/05/20 Hb 11.4, creatinine 1.08, GFR 45. Denies palpitations. Rate controlled today. Continue Eliquis 2.5mg  BID. Dose reduced due to age, body habitus.   Disposition: Follow up in May 2023  with Dr. Johnsie Cancel  AND Dr. Curt Bears 04/2021.  Signed, Loel Dubonnet, NP 01/01/2021, 9:34 PM [place]

## 2021-01-01 NOTE — Patient Instructions (Signed)
Medication Instructions:  Continue your current medications.   *If you need a refill on your cardiac medications before your next appointment, please call your pharmacy*   Lab Work: None ordered today  Testing/Procedures:  You are due for your carotid duplex in October   Follow-Up: At [facility], you and your health needs are our priority.  As part of our continuing mission to provide you with exceptional heart care, we have created designated Provider Care Teams.  These Care Teams include your primary Cardiologist (physician) and Advanced Practice Providers (APPs -  Physician Assistants and Nurse Practitioners) who all work together to provide you with the care you need, when you need it.  We recommend signing up for the patient portal called "MyChart".  Sign up information is provided on this After Visit Summary.  MyChart is used to connect with patients for Virtual Visits (Telemedicine).  Patients are able to view lab/test results, encounter notes, upcoming appointments, etc.  Non-urgent messages can be sent to your provider as well.   To learn more about what you can do with MyChart, go to [email or web address].    Your next appointment:   In November 2022 with Dr. Curt Bears AND May 2023 with Dr. Johnsie Cancel  Other Instructions Exercises to do While Sitting  Exercises that you do while sitting (chair exercises) can give you many of the same benefits as full exercise. Benefits include strengthening your heart, burning calories, and keeping muscles and joints healthy. Exercise can also improve your mood and help with depression andanxiety. You may benefit from chair exercises if you are unable to do standing exercises because of: Diabetic foot pain. Obesity. Illness. Arthritis. Recovery from surgery or injury. Breathing problems. Balance problems. Another type of disability. Before starting chair exercises, check with your health care provider or a physical therapist to find  out how much exercise you can tolerate and which exercises are safe for you. If your health care provider approves: Start out slowly and build up over time. Aim to work up to about 10-20 minutes for each exercise session. Make exercise part of your daily routine. Drink water when you exercise. Do not wait until you are thirsty. Drink every 10-15 minutes. Stop exercising right away if you have pain, nausea, shortness of breath, or dizziness. If you are exercising in a wheelchair, make sure to lock the wheels. Ask your health care provider whether you can do tai chi or yoga. Many positions in these mind-body exercises can be modified to do while seated. Warm-up Before starting other exercises: Sit up as straight as you can. Have your knees bent at 90 degrees, which is the shape of the capital letter "L." Keep your feet flat on the floor. Sit at the front edge of your chair, if you can. Pull in (tighten) the muscles in your abdomen and stretch your spine and neck as straight as you can. Hold this position for a few minutes. Breathe in and out evenly. Try to concentrate on your breathing, and relax your mind. Stretching Exercise A: Arm stretch Hold your arms out straight in front of your body. Bend your hands at the wrist with your fingers pointing up, as if signaling someone to stop. Notice the slight tension in your forearms as you hold the position. Keeping your arms out and your hands bent, rotate your hands outward as far as you can and hold this stretch. Aim to have your thumbs pointing up and your pinkie fingers pointing down. Slowly repeat arm stretches for  one minute as tolerated. Exercise B: Leg stretch If you can move your legs, try to "draw" letters on the floor with the toes of your foot. Write your name with one foot. Write your name with the toes of your other foot. Slowly repeat the movements for one minute as tolerated. Exercise C: Reach for the sky Reach your hands as far over  your head as you can to stretch your spine. Move your hands and arms as if you are climbing a rope. Slowly repeat the movements for one minute as tolerated. Range of motion exercises Exercise A: Shoulder roll Let your arms hang loosely at your sides. Lift just your shoulders up toward your ears, then let them relax back down. When your shoulders feel loose, rotate your shoulders in backward and forward circles. Do shoulder rolls slowly for one minute as tolerated. Exercise B: March in place As if you are marching, pump your arms and lift your legs up and down. Lift your knees as high as you can. If you are unable to lift your knees, just pump your arms and move your ankles and feet up and down. March in place for one minute as tolerated. Exercise C: Seated jumping jacks Let your arms hang down straight. Keeping your arms straight, lift them up over your head. Aim to point your fingers to the ceiling. While you lift your arms, straighten your legs and slide your heels along the floor to your sides, as wide as you can. As you bring your arms back down to your sides, slide your legs back together. If you are unable to use your legs, just move your arms. Slowly repeat seated jumping jacks for one minute as tolerated. Strengthening exercises Exercise A: Shoulder squeeze Hold your arms straight out from your body to your sides, with your elbows bent and your fists pointed at the ceiling. Keeping your arms in the bent position, move them forward so your elbows and forearms meet in front of your face. Open your arms back out as wide as you can with your elbows still bent, until you feel your shoulder blades squeezing together. Hold for 5 seconds. Slowly repeat the movements forward and backward for one minute as tolerated. Contact a health care provider if you: Had to stop exercising due to any of the following: Pain. Nausea. Shortness of breath. Dizziness. Fatigue. Have significant pain or  soreness after exercising. Get help right away if you have: Chest pain. Difficulty breathing. These symptoms may represent a serious problem that is an emergency. Do not wait to see if the symptoms will go away. Get medical help right away. Call your local emergency services (911 in the [place] Do not drive yourself to the hospital. This information is not intended to replace advice given to you by your health care provider. Make sure you discuss any questions you have with your healthcare provider. Document Revised: 10/03/2019 Document Reviewed: 10/20/2019 Elsevier Patient Education  2022 [employer].

## 2021-01-04 ENCOUNTER — Ambulatory Visit: Payer: PPO | Admitting: Family Medicine

## 2021-01-18 ENCOUNTER — Ambulatory Visit (INDEPENDENT_AMBULATORY_CARE_PROVIDER_SITE_OTHER): Payer: PPO

## 2021-01-18 DIAGNOSIS — I255 Ischemic cardiomyopathy: Secondary | ICD-10-CM | POA: Diagnosis not present

## 2021-01-18 DIAGNOSIS — I4821 Permanent atrial fibrillation: Secondary | ICD-10-CM

## 2021-01-21 ENCOUNTER — Ambulatory Visit (INDEPENDENT_AMBULATORY_CARE_PROVIDER_SITE_OTHER): Payer: PPO | Admitting: Physician Assistant

## 2021-01-21 ENCOUNTER — Other Ambulatory Visit: Payer: Self-pay

## 2021-01-21 VITALS — BP 146/74 | HR 73 | Temp 97.2°F | Ht 64.0 in

## 2021-01-21 DIAGNOSIS — Z23 Encounter for immunization: Secondary | ICD-10-CM

## 2021-01-21 DIAGNOSIS — R296 Repeated falls: Secondary | ICD-10-CM

## 2021-01-21 DIAGNOSIS — D508 Other iron deficiency anemias: Secondary | ICD-10-CM

## 2021-01-21 DIAGNOSIS — I10 Essential (primary) hypertension: Secondary | ICD-10-CM | POA: Diagnosis not present

## 2021-01-21 DIAGNOSIS — E1122 Type 2 diabetes mellitus with diabetic chronic kidney disease: Secondary | ICD-10-CM

## 2021-01-21 LAB — CUP PACEART REMOTE DEVICE CHECK
Battery Remaining Longevity: 10 mo
Battery Voltage: 2.86 V
Brady Statistic RA Percent Paced: 0.31 %
Brady Statistic RV Percent Paced: 77.49 %
Date Time Interrogation Session: 20220715012304
HighPow Impedance: 56 Ohm
Implantable Lead Implant Date: 20070502
Implantable Lead Implant Date: 20150205
Implantable Lead Location: 753859
Implantable Lead Location: 753860
Implantable Lead Special Function: 2015
Implantable Pulse Generator Implant Date: 20150205
Lead Channel Impedance Value: 304 Ohm
Lead Channel Impedance Value: 361 Ohm
Lead Channel Impedance Value: 456 Ohm
Lead Channel Pacing Threshold Amplitude: 0.625 V
Lead Channel Pacing Threshold Amplitude: 0.75 V
Lead Channel Pacing Threshold Pulse Width: 0.4 ms
Lead Channel Pacing Threshold Pulse Width: 0.4 ms
Lead Channel Sensing Intrinsic Amplitude: 1.5 mV
Lead Channel Sensing Intrinsic Amplitude: 1.5 mV
Lead Channel Sensing Intrinsic Amplitude: 8.125 mV
Lead Channel Sensing Intrinsic Amplitude: 8.125 mV
Lead Channel Setting Pacing Amplitude: 1.75 V
Lead Channel Setting Pacing Amplitude: 2 V
Lead Channel Setting Pacing Pulse Width: 0.4 ms
Lead Channel Setting Sensing Sensitivity: 0.3 mV

## 2021-01-21 NOTE — Patient Instructions (Addendum)
Please go to the lab today and I will call with results. Continue to monitor blood pressure at home. I will try to get home PT out to help with gait and strengthening. Boost or Ensure encouraged daily.  Call if any concerns.

## 2021-01-21 NOTE — Progress Notes (Signed)
Established Patient Office Visit  Subjective:  Patient ID: Amy Wolf, female    DOB: August 22, 1927  Age: 87 y.o. MRN: [id number]  CC:  Chief Complaint  Patient presents with   Diabetes   Hypertension    HPI Amy Wolf presents for recheck and TOC from Dr. Rogers Blocker. Here with daughter, Amy Wolf, who brought her to the appointment today.  Past Medical History:  Diagnosis Date   AICD (automatic cardioverter/defibrillator) present    Arrhythmia    Arthritis    Atherosclerosis of right carotid artery 04/01/2019   80% blockage. Last ultrasound in 12/2018. Saw vascular surgery in June 2020. Put on DPT in June (asa added by surgeon)   Benign essential HTN 04/01/2019   CAD (coronary artery disease) 04/01/2019   Cardiac arrhythmia due to congenital heart disease    CHF (congestive heart failure) ([place]) 04/01/2019   Chronic kidney disease (CKD), stage III (moderate) ([place]) 04/04/2019   Diabetes ([place])    Glaucoma    Heart murmur    Hx of CABG 04/01/2019   Several years ago per family    Hyperlipemia    Migraines    Pacemaker    Paroxysmal A-fib ([place]) 04/01/2019   warafin in the remote past, but changed to plavix. CHADS2 score of 8%. High fall risk...    PVD (peripheral vascular disease) ([place]) 04/01/2019   Type 2 diabetes mellitus with diabetic chronic kidney disease ([place]) 04/01/2019   Urine incontinence    Valvular heart disease 04/01/2019   Mitral and tricuspid annuloplasty     Past Surgical History:  Procedure Laterality Date   ABDOMINAL HYSTERECTOMY     [place]     CORONARY ARTERY BYPASS GRAFT     HIP FRACTURE SURGERY  2015   PPM GENERATOR CHANGEOUT      Family History  Problem Relation Age of Onset   Cancer Father    Cancer Sister    Cancer Daughter     Social History   Socioeconomic History   Marital status: Married    Spouse name: Not on file   Number of children: 2   Years of education: Not on file   Highest education level: Not  on file  Occupational History   Occupation: Retired   Tobacco Use   Smoking status: Never   Smokeless tobacco: Never  Vaping Use   Vaping Use: Never used  Substance and Sexual Activity   Alcohol use: Not Currently   Drug use: Never   Sexual activity: Not Currently  Other Topics Concern   Not on file  Social History Narrative   Lives with spouse   Does not drive    Daughter bring meals and transports   [employer] on wheels    Social Determinants of Health   Financial Resource Strain: Low Risk    Difficulty of Paying Living Expenses: Not hard at all  Food Insecurity: No Food Insecurity   Worried About Charity fundraiser in the Last Year: Never true   Arboriculturist in the Last Year: Never true  Transportation Needs: No Transportation Needs   Lack of Transportation (Medical): No   Lack of Transportation (Non-Medical): No  Physical Activity: Inactive   Days of Exercise per Week: 0 days   Minutes of Exercise per Session: 0 min  Stress: No Stress Concern Present   Feeling of Stress : Not at all  Social Connections: Moderately Isolated   Frequency of Communication with  Friends and Family: More than three times a week   Frequency of Social Gatherings with Friends and Family: Once a week   Attends Religious Services: Never   Marine scientist or Organizations: No   Attends Music therapist: Never   Marital Status: Married  Human resources officer Violence: Not At Risk   Fear of Current or Ex-Partner: No   Emotionally Abused: No   Physically Abused: No   Sexually Abused: No    Outpatient Medications Prior to Visit  Medication Sig Dispense Refill   acetaminophen (TYLENOL) 500 MG tablet Take 500 mg by mouth in the morning and at bedtime.      bimatoprost (LUMIGAN) 0.01 % SOLN Place 1 drop into both eyes at bedtime.     clopidogrel (PLAVIX) 75 MG tablet TAKE 1 TABLET BY MOUTH EVERY DAY 90 tablet 0   ELIQUIS 2.5 MG TABS tablet TAKE 1 TABLET BY MOUTH TWICE A  DAY 60 tablet 5   ferrous sulfate 325 (65 FE) MG tablet Take 325 mg by mouth daily with breakfast.      glipiZIDE (GLUCOTROL XL) 2.5 MG 24 hr tablet TAKE 1 TABLET BY MOUTH DAILY WITH BREAKFAST. 90 tablet 1   Melatonin 3 MG CAPS Take 3 mg by mouth at bedtime.      metoprolol tartrate (LOPRESSOR) 50 MG tablet Take 1 tablet (50 mg total) by mouth 2 (two) times daily. 90 tablet 3   Netarsudil Dimesylate (RHOPRESSA) 0.02 % SOLN Apply to eye. 1 gtt both eyes q hs     pravastatin (PRAVACHOL) 80 MG tablet TAKE 1 TABLET BY MOUTH EVERY DAY 90 tablet 0   ramipril (ALTACE) 2.5 MG capsule Take 1 capsule (2.5 mg total) by mouth daily. 90 capsule 3   No facility-administered medications prior to visit.    Allergies  Allergen Reactions   Iodine Other (See Comments)   Erythromycin Rash   Penicillins Rash   Sulfa Antibiotics Rash    ROS Review of Systems REFER TO HPI FOR PERTINENT POSITIVES AND NEGATIVES    Objective:    Physical Exam Vitals reviewed.  Constitutional:      Appearance: Normal appearance.     Comments: Thin Gaunt appearance Wheelchair   HENT:     Head: Normocephalic.  Cardiovascular:     Rate and Rhythm: Normal rate and regular rhythm.     Heart sounds: Normal heart sounds.  Pulmonary:     Effort: Pulmonary effort is normal.     Breath sounds: Normal breath sounds.  Abdominal:     General: Bowel sounds are normal.     Palpations: Abdomen is soft.  Neurological:     General: No focal deficit present.     Mental Status: She is alert. Mental status is at baseline.  Psychiatric:        Mood and Affect: Mood normal.        Behavior: Behavior normal.    BP (!) 146/74   Wolf 73   Temp (!) 97.2 F (36.2 C)   Ht 5\' 4"  (1.626 m)   SpO2 98%   BMI 16.68 kg/m  Wt Readings from Last 3 Encounters:  01/01/21 97 lb 3.2 oz (44.1 kg)  07/05/20 96 lb 6.4 oz (43.7 kg)  05/08/20 99 lb (44.9 kg)     Health Maintenance Due  Topic Date Due   OPHTHALMOLOGY EXAM  Never done    TETANUS/TDAP  Never done   Zoster Vaccines- Shingrix (1 of 2) Never done  DEXA SCAN  Never done   FOOT EXAM  05/01/2020    There are no preventive care reminders to display for this patient.  Lab Results  Component Value Date   TSH 2.41 04/26/2020   Lab Results  Component Value Date   WBC 7.5 01/21/2021   HGB 11.8 (L) 01/21/2021   HCT 34.9 (L) 01/21/2021   MCV 94.5 01/21/2021   PLT 233.0 01/21/2021   Lab Results  Component Value Date   NA 138 01/21/2021   K 4.4 01/21/2021   CO2 24 01/21/2021   GLUCOSE 108 (H) 01/21/2021   BUN 25 (H) 01/21/2021   CREATININE 1.02 01/21/2021   BILITOT 0.4 01/21/2021   ALKPHOS 44 01/21/2021   AST 18 01/21/2021   ALT 16 01/21/2021   PROT 6.9 01/21/2021   ALBUMIN 4.2 01/21/2021   CALCIUM 9.1 01/21/2021   GFR 48.40 (L) 01/21/2021   Lab Results  Component Value Date   CHOL 170 04/01/2019   Lab Results  Component Value Date   HDL 63.80 04/01/2019   Lab Results  Component Value Date   LDLCALC 89 04/01/2019   Lab Results  Component Value Date   TRIG 87.0 04/01/2019   Lab Results  Component Value Date   CHOLHDL 3 04/01/2019   Lab Results  Component Value Date   HGBA1C 6.6 (H) 01/21/2021      Assessment & Plan:   Problem List Items Addressed This Visit       Cardiovascular and Mediastinum   Benign essential HTN   Relevant Orders   CBC with Differential/Platelet (Completed)     Endocrine   Type 2 diabetes mellitus with diabetic chronic kidney disease ([place]) - Primary   Relevant Orders   CBC with Differential/Platelet (Completed)   Comprehensive metabolic panel (Completed)   Hemoglobin A1c (Completed)   Other Visit Diagnoses     Other iron deficiency anemia       Relevant Orders   CBC with Differential/Platelet (Completed)   Falls frequently       Need for pneumococcal vaccination       Relevant Orders   Pneumococcal polysaccharide vaccine 23-valent greater than or equal to 2yo subcutaneous/IM (Completed)        No orders of the defined types were placed in this encounter.   Follow-up: Return in about 6 months (around 07/24/2021) for HTN / DM recheck .   1. Type 2 diabetes mellitus with chronic kidney disease, without long-term current use of insulin, unspecified CKD stage (HCC) Stable.  Patient is currently taking glipizide XL 2.5 mg once daily.  Mostly diet controlled.  Will check A1c today.  Last A1c was 6.5 in December 2021.  2. Benign essential HTN Stable.  Patient is currently taking metoprolol 50 mg 1 tab twice daily.  3. Other iron deficiency anemia Will check CBC today. Takes ferrous sulfate 325 mg daily.   4. Falls frequently Daughter expresses concern for history of falls and prevention of future falls.  States that she uses a walker at home and does not keep her feet keeping up with her walker.  She does not have any recent falls, but wants to prevent future ones.  She would like to continue strengthening exercises as well.  I will try to get physical therapy out to the house to help her with some strengthening and gait therapy as well.  5. Need for pneumococcal vaccination Updated today - gave Pneumovax 23. Pt tolerated well.     Atif Chapple M Toretto Tingler, PA-C

## 2021-01-22 LAB — HEMOGLOBIN A1C: Hgb A1c MFr Bld: 6.6 % — ABNORMAL HIGH (ref 4.6–6.5)

## 2021-01-22 LAB — COMPREHENSIVE METABOLIC PANEL
ALT: 16 U/L (ref 0–35)
AST: 18 U/L (ref 0–37)
Albumin: 4.2 g/dL (ref 3.5–5.2)
Alkaline Phosphatase: 44 U/L (ref 39–117)
BUN: 25 mg/dL — ABNORMAL HIGH (ref 6–23)
CO2: 24 mEq/L (ref 19–32)
Calcium: 9.1 mg/dL (ref 8.4–10.5)
Chloride: 106 mEq/L (ref 96–112)
Creatinine, Ser: 1.02 mg/dL (ref 0.40–1.20)
GFR: 48.4 mL/min — ABNORMAL LOW (ref >60.00)
Glucose, Bld: 108 mg/dL — ABNORMAL HIGH (ref 70–99)
Potassium: 4.4 mEq/L (ref 3.5–5.1)
Sodium: 138 mEq/L (ref 135–145)
Total Bilirubin: 0.4 mg/dL (ref 0.2–1.2)
Total Protein: 6.9 g/dL (ref 6.0–8.3)

## 2021-01-22 LAB — CBC WITH DIFFERENTIAL/PLATELET
Basophils Absolute: 0.1 10*3/uL (ref 0.0–0.1)
Basophils Relative: 0.8 % (ref 0.0–3.0)
Eosinophils Absolute: 0.2 10*3/uL (ref 0.0–0.7)
Eosinophils Relative: 2.1 % (ref 0.0–5.0)
HCT: 34.9 % — ABNORMAL LOW (ref 36.0–46.0)
Hemoglobin: 11.8 g/dL — ABNORMAL LOW (ref 12.0–15.0)
Lymphocytes Relative: 26.6 % (ref 12.0–46.0)
Lymphs Abs: 2 10*3/uL (ref 0.7–4.0)
MCHC: 33.9 g/dL (ref 30.0–36.0)
MCV: 94.5 fl (ref 78.0–100.0)
Monocytes Absolute: 0.6 10*3/uL (ref 0.1–1.0)
Monocytes Relative: 8.7 % (ref 3.0–12.0)
Neutro Abs: 4.6 10*3/uL (ref 1.4–7.7)
Neutrophils Relative %: 61.8 % (ref 43.0–77.0)
Platelets: 233 10*3/uL (ref 150.0–400.0)
RBC: 3.69 Mil/uL — ABNORMAL LOW (ref 3.87–5.11)
RDW: 14 % (ref 11.5–15.5)
WBC: 7.5 10*3/uL (ref 4.0–10.5)

## 2021-01-25 ENCOUNTER — Other Ambulatory Visit: Payer: Self-pay | Admitting: Family Medicine

## 2021-01-25 ENCOUNTER — Telehealth: Payer: Self-pay | Admitting: Physician Assistant

## 2021-01-25 NOTE — Chronic Care Management (AMB) (Signed)
  Chronic Care Management   Note  01/25/2021 Name: Amy Wolf MRN: [id number] DOB: February 03, 1928  Amy Wolf is a 87 y.o. year old female who is a primary care patient of Allwardt, [name] M, PA-C. I reached out to [name] by phone today in response to a referral sent by [name]'s PCP, Allwardt, [name] M, PA-C.   [name] was given information about Chronic Care Management services today including:  CCM service includes personalized support from designated clinical staff supervised by her physician, including individualized plan of care and coordination with other care providers 24/7 contact phone numbers for assistance for urgent and routine care needs. Service will only be billed when office clinical staff spend 20 minutes or more in a month to coordinate care. Only one practitioner may furnish and bill the service in a calendar month. The patient may stop CCM services at any time (effective at the end of the month) by phone call to the office staff.   Patient did not agree to enrollment in care management services and does not wish to consider at this time.  Follow up plan:   [name] Upstream Scheduler

## 2021-01-25 NOTE — Chronic Care Management (AMB) (Signed)
  Chronic Care Management   Outreach Note  01/25/2021 Name: Eira Nanton MRN: [id number] DOB: June 15, 1928  Referred by: Allwardt, Randa Evens, PA-C Reason for referral : No chief complaint on file.   An unsuccessful telephone outreach was attempted today. The patient was referred to the pharmacist for assistance with care management and care coordination.   Follow Up Plan:   [name] Upstream Scheduler

## 2021-02-04 ENCOUNTER — Telehealth: Payer: Self-pay

## 2021-02-04 ENCOUNTER — Encounter: Payer: Self-pay | Admitting: Physician Assistant

## 2021-02-04 DIAGNOSIS — Z950 Presence of cardiac pacemaker: Secondary | ICD-10-CM | POA: Diagnosis not present

## 2021-02-04 DIAGNOSIS — I10 Essential (primary) hypertension: Secondary | ICD-10-CM | POA: Diagnosis not present

## 2021-02-04 DIAGNOSIS — N183 Chronic kidney disease, stage 3 unspecified: Secondary | ICD-10-CM | POA: Diagnosis not present

## 2021-02-04 DIAGNOSIS — R2681 Unsteadiness on feet: Secondary | ICD-10-CM | POA: Diagnosis not present

## 2021-02-04 DIAGNOSIS — Z7984 Long term (current) use of oral hypoglycemic drugs: Secondary | ICD-10-CM | POA: Diagnosis not present

## 2021-02-04 DIAGNOSIS — R296 Repeated falls: Secondary | ICD-10-CM | POA: Diagnosis not present

## 2021-02-04 DIAGNOSIS — I480 Paroxysmal atrial fibrillation: Secondary | ICD-10-CM | POA: Diagnosis not present

## 2021-02-04 DIAGNOSIS — D509 Iron deficiency anemia, unspecified: Secondary | ICD-10-CM | POA: Diagnosis not present

## 2021-02-04 DIAGNOSIS — E1122 Type 2 diabetes mellitus with diabetic chronic kidney disease: Secondary | ICD-10-CM | POA: Diagnosis not present

## 2021-02-04 DIAGNOSIS — I2510 Atherosclerotic heart disease of native coronary artery without angina pectoris: Secondary | ICD-10-CM | POA: Diagnosis not present

## 2021-02-04 NOTE — Telephone Encounter (Signed)
Amy Wolf called from home health. He stated that he would be doing therapy with [name] for the next 2 weeks. Also stated that he needs clearance on a few things when [name] is back

## 2021-02-05 NOTE — Telephone Encounter (Signed)
No documentation with phone number for [place]lier [place] will wait for another call from [place] t

## 2021-02-08 NOTE — Telephone Encounter (Signed)
I had Khamika place two on your desk.

## 2021-02-11 ENCOUNTER — Telehealth: Payer: Self-pay | Admitting: Lab

## 2021-02-11 NOTE — Progress Notes (Signed)
Remote ICD transmission.   

## 2021-02-11 NOTE — Chronic Care Management (AMB) (Signed)
  Chronic Care Management   Note  02/11/2021 Name: Amy Wolf MRN: [id number] DOB: 11/01/1927  Amy Wolf is a 87 y.o. year old female who is a primary care patient of Allwardt, [name] M, PA-C. I reached out to Amy Wolf by phone today in response to a referral sent by Amy Wolf's PCP, Allwardt, [name] M, PA-C.   Amy Wolf was given information about Chronic Care Management services today including:  CCM service includes personalized support from designated clinical staff supervised by her physician, including individualized plan of care and coordination with other care providers 24/7 contact phone numbers for assistance for urgent and routine care needs. Service will only be billed when office clinical staff spend 20 minutes or more in a month to coordinate care. Only one practitioner may furnish and bill the service in a calendar month. The patient may stop CCM services at any time (effective at the end of the month) by phone call to the office staff.   Patient agreed to services and verbal consent obtained.   Follow up plan:   [place]

## 2021-02-14 ENCOUNTER — Telehealth: Payer: Self-pay

## 2021-02-14 NOTE — Telephone Encounter (Signed)
**Note De-Identified [name] Obfuscation** The provider page of the pts BMSPAF application for Eliquis was left at the office. I have completed the page and have emailed it to Dr [employer] nurse so she can obtain his signature, date it, and to fax to [facility] (per the pts request [name] [facility] message) at the fax number written on the cover letter included or to place in the to be faxed basket in Medical Records to be faxed.

## 2021-02-20 NOTE — Telephone Encounter (Signed)
Faxed, confirmation received 

## 2021-02-22 ENCOUNTER — Ambulatory Visit: Payer: PPO | Admitting: Podiatry

## 2021-02-25 ENCOUNTER — Telehealth: Payer: Self-pay

## 2021-02-25 NOTE — Telephone Encounter (Signed)
.  Home Health verbal orders [place] Name: Patrick [place] number: [phone]  Requesting OT/PT/Skilled nursing/Social Work/Speech:PT   Reason:  Frequency:1 week 5   Please forward to [facility] pool or providers CMA

## 2021-02-25 NOTE — Telephone Encounter (Signed)
Left detailed message ok for verbal orders

## 2021-02-27 ENCOUNTER — Ambulatory Visit (INDEPENDENT_AMBULATORY_CARE_PROVIDER_SITE_OTHER): Payer: PPO | Admitting: Podiatry

## 2021-02-27 ENCOUNTER — Other Ambulatory Visit: Payer: Self-pay

## 2021-02-27 DIAGNOSIS — M79675 Pain in left toe(s): Secondary | ICD-10-CM | POA: Diagnosis not present

## 2021-02-27 DIAGNOSIS — B351 Tinea unguium: Secondary | ICD-10-CM | POA: Diagnosis not present

## 2021-02-27 DIAGNOSIS — E1151 Type 2 diabetes mellitus with diabetic peripheral angiopathy without gangrene: Secondary | ICD-10-CM | POA: Diagnosis not present

## 2021-02-27 DIAGNOSIS — M79674 Pain in right toe(s): Secondary | ICD-10-CM | POA: Diagnosis not present

## 2021-02-27 DIAGNOSIS — H401133 Primary open-angle glaucoma, bilateral, severe stage: Secondary | ICD-10-CM | POA: Diagnosis not present

## 2021-02-28 ENCOUNTER — Encounter: Payer: Self-pay | Admitting: Podiatry

## 2021-02-28 NOTE — Progress Notes (Signed)
  Subjective:  Patient ID: Amy Wolf, female    DOB: Jun 04, 1928,  MRN: [id number]  Chief Complaint  Patient presents with   Nail Problem    Nail trim   87 y.o. female returns for the above complaint.  Patient presents with painful elongated thickened mycotic toenails x10.  Patient states that they hurt when they are ambulating.  She would like to have them debrided down she is not able to do it herself.  She denies any other acute complaints..  Objective:  There were no vitals filed for this visit. Podiatric Exam: Vascular: dorsalis pedis and posterior tibial pulses are palpable bilateral. Capillary return is immediate. Temperature gradient is WNL. Skin turgor WNL  Sensorium: Normal Semmes Weinstein monofilament test. Normal tactile sensation bilaterally. Nail Exam: Pt has thick disfigured discolored nails with subungual debris noted bilateral entire nail hallux through fifth toenails Ulcer Exam: There is no evidence of ulcer or pre-ulcerative changes or infection. Orthopedic Exam: Muscle tone and strength are WNL. No limitations in general ROM. No crepitus or effusions noted. HAV  B/L.  Hammer toes 2-5  B/L.  First metatarsophalangeal joint arthritis noted to the right side.  Plantarflexed deformity to the metatarsal noted leading to submetatarsal 1 hyperkeratotic lesion Skin: No Porokeratosis. No infection or ulcers  Assessment & Plan:  Patient was evaluated and treated and all questions answered.  Right first metatarsophalangeal joint arthritis/capsulitis -Resolved with a steroid injection.  No further injections are warranted as the pain has resolved.  If it continues to get worse or returns we will consider doing orthotics management with Morton's extension. -Patient is functioning well with orthotics  Plantar fat pad atrophy -I explained to patient the etiology of plantar fat pad and various treatment options were discussed.  Given that patient does not have any subcutaneous tissue  in the ball of the foot is leading to her walking on her bone and therefore causing metatarsalgia-like pain.  I discussed this with patient in extensive detail.  She states understanding.  She does not want any kind of padding offloading.  Onychomycosis with pain  -Nails palliatively debrided as below. -Educated on self-care  Procedure: Nail Debridement Rationale: pain  Type of Debridement: manual, sharp debridement. Instrumentation: Nail nipper, rotary burr. Number of Nails: 10  Procedures and Treatment: Consent by patient was obtained for treatment procedures. The patient understood the discussion of treatment and procedures well. All questions were answered thoroughly reviewed. Debridement of mycotic and hypertrophic toenails, 1 through 5 bilateral and clearing of subungual debris. No ulceration, no infection noted.  Return Visit-Office Procedure: Patient instructed to return to the office for a follow up visit 3 months for continued evaluation and treatment.  Boneta Lucks, DPM    No follow-ups on file.

## 2021-03-13 DIAGNOSIS — I480 Paroxysmal atrial fibrillation: Secondary | ICD-10-CM | POA: Diagnosis not present

## 2021-03-13 DIAGNOSIS — E1122 Type 2 diabetes mellitus with diabetic chronic kidney disease: Secondary | ICD-10-CM | POA: Diagnosis not present

## 2021-03-13 DIAGNOSIS — I10 Essential (primary) hypertension: Secondary | ICD-10-CM | POA: Diagnosis not present

## 2021-03-13 DIAGNOSIS — R2681 Unsteadiness on feet: Secondary | ICD-10-CM | POA: Diagnosis not present

## 2021-03-13 DIAGNOSIS — D509 Iron deficiency anemia, unspecified: Secondary | ICD-10-CM | POA: Diagnosis not present

## 2021-03-13 DIAGNOSIS — Z7984 Long term (current) use of oral hypoglycemic drugs: Secondary | ICD-10-CM | POA: Diagnosis not present

## 2021-03-13 DIAGNOSIS — Z950 Presence of cardiac pacemaker: Secondary | ICD-10-CM | POA: Diagnosis not present

## 2021-03-13 DIAGNOSIS — R296 Repeated falls: Secondary | ICD-10-CM | POA: Diagnosis not present

## 2021-03-13 DIAGNOSIS — I2510 Atherosclerotic heart disease of native coronary artery without angina pectoris: Secondary | ICD-10-CM | POA: Diagnosis not present

## 2021-03-13 DIAGNOSIS — N183 Chronic kidney disease, stage 3 unspecified: Secondary | ICD-10-CM | POA: Diagnosis not present

## 2021-03-21 DIAGNOSIS — H401133 Primary open-angle glaucoma, bilateral, severe stage: Secondary | ICD-10-CM | POA: Diagnosis not present

## 2021-03-27 ENCOUNTER — Other Ambulatory Visit: Payer: Self-pay | Admitting: Family Medicine

## 2021-03-28 ENCOUNTER — Telehealth: Payer: Self-pay | Admitting: Pharmacist

## 2021-03-28 NOTE — Chronic Care Management (AMB) (Addendum)
Chronic Care Management Pharmacy Assistant   Name: Kassi Esteve  MRN: [id number] DOB: 1927-10-22  Amy Wolf is an 87 y.o. year old female who presents for his initial CCM visit with the clinical pharmacist.  Reason for Encounter: Chart Prep For Initial Visit With Clinical Pharmacist   Conditions to be addressed/monitored: HTN, CAD, CHF, PVD, DM II, CKD, AICD, Glaucoma  Primary concerns for visit include: HTN, DM II  Recent office visits:  01/21/2021 OV (PCP) Allwardt, [name] M, PA-C: chronic follow up, no medication changes indicated.  Recent consult visits:  02/27/2021 [facility] (podiatry) Felipa Furnace, DPM; no medication changes indicated.  01/01/2021 OV (cardiology) Laurann Montana S, NP; Continue Eliquis 2.5mg  BID. Dose reduced due to age, body habitus.   Hospital visits:  None in previous 6 months  Medications: Outpatient Encounter Medications as of 03/28/2021  Medication Sig   acetaminophen (TYLENOL) 500 MG tablet Take 500 mg by mouth in the morning and at bedtime.    bimatoprost (LUMIGAN) 0.01 % SOLN Place 1 drop into both eyes at bedtime.   clopidogrel (PLAVIX) 75 MG tablet TAKE 1 TABLET BY MOUTH EVERY DAY   ELIQUIS 2.5 MG TABS tablet TAKE 1 TABLET BY MOUTH TWICE A DAY   ferrous sulfate 325 (65 FE) MG tablet Take 325 mg by mouth daily with breakfast.    glipiZIDE (GLUCOTROL XL) 2.5 MG 24 hr tablet TAKE 1 TABLET BY MOUTH DAILY WITH BREAKFAST.   Melatonin 3 MG CAPS Take 3 mg by mouth at bedtime.    metoprolol tartrate (LOPRESSOR) 50 MG tablet Take 1 tablet (50 mg total) by mouth 2 (two) times daily.   Netarsudil Dimesylate (RHOPRESSA) 0.02 % SOLN Apply to eye. 1 gtt both eyes q hs   pravastatin (PRAVACHOL) 80 MG tablet TAKE 1 TABLET BY MOUTH EVERY DAY   ramipril (ALTACE) 2.5 MG capsule Take 1 capsule (2.5 mg total) by mouth daily.   No facility-administered encounter medications on file as of 03/28/2021.   Current Medications: Pravastatin 80 mg last filled 12/31/2020 90  DS Plavix 75 mg last filled 01/25/2021 90 DS Metoprolol Tartrate 50 mg last filled 01/06/2021 90 DS Glipizide 2.5 mg last filled 01/19/2021 90 DS Eliquis 2.5 mg last filled 02/11/2021 30 DS Ramipril 2.5 mg last filled 03/01/2021 90 DS Ferrous Sulfate 325 mg buys otc takes daily Melatonin 3 mg buys otc takes daily Acetaminophen 500 mg Bimatoprost 0.01% solution last filled 02/27/2021 90 DS Netarsudil Dimesylate 0.02% last filled 02/27/2021 84 DS Alphagan 1 drop r eye bid  Patient Questions:  Any changes in your medications or health?' Patient's daughter states the patient hasn't had any recent changes to her medications or health.  Any side effects from any medications?  Patient's daughter states the patient does not currently have any side effects from any of her medications.  Do you have any symptoms or problems not managed by your medications? Patient's daughter states the patient does not currently have any symptoms or problems that are not managed by her medications.  Any concerns about your health right now? Patient's daughter stated "Yes, she's 86 years old."  Has your provider asked that you check blood pressure, blood sugar, or follow special diet at home? Patient's daughter states the patient does not check her blood sugars but she does check her BP once in a while. She does not follow any diet.  Do you get any type of exercise on a regular basis? She does PT once a week.  Can you  think of a goal you would like to reach for your health? Patients daughter states the patient does not currently have a goal.  Do you have any problems getting your medications? Patient's daughter states they don't have any problems getting medications. She is currently receiving Eliquis through [facility].  Is there anything that you would like to discuss during the appointment?  Patient's daughter states she doesn't have anything to discuss at this time.  Please bring medications and  supplements to appointment   Care Gaps: Medicare Annual Wellness: last AWV 11/01/2020 Ophthalmology Exam: Overdue - never done Foot Exam: Overdue since 05/01/2020 Hemoglobin A1C: 6.6% on 01/21/21 Dexa Scan: Overdue - never done HPV Vaccines: Aged Out  Future Appointments  Date Time Provider [place]  04/02/2021  9:00 AM [facility] CCM PHARMACIST [facility]  04/09/2021  3:00 PM [facility] NL VASC 3 [facility]  04/19/2021  7:50 AM [facility] DEVICE REMOTES [facility]  05/07/2021  2:00 PM Constance Haw, MD [facility]  07/22/2021  2:30 PM Allwardt, Randa Evens, PA-C [facility]  08/30/2021  1:15 PM Felipa Furnace, DPM [facility]  11/07/2021  2:30 PM [facility] HEALTH COACH [facility]     Star Rating Drugs: Pravastatin 80 mg last filled 12/31/2020 90 DS Ramipril 2.5 mg last filled 03/01/2021 90 DS  April D Calhoun, [place] Pharmacist Assistant [phone]

## 2021-03-29 NOTE — Progress Notes (Signed)
Chronic Care Management Pharmacy Note  04/02/2021 Name:  Amy Wolf MRN:  [id number] DOB:  [birth year]  Summary: Initial visit with PharmD.  Patient 87 yo with history of falls currently on Eliquis (dosed appropriately for age/weight) and Clopidogrel.  Low Hgb for the past year.  Does have significant cardiac history.  Also not checking glucose at home and on sulfonylurea - does not mention any symptoms of hypoglycemia.  Recommendations/Changes made from today's visit: Risk/benefit of both anticoagulants (consider d.c Plavix) Keep close eye on glucose with Glipizide use - recommended they keep fast acting glucose close by  Plan: FU 6 months - sooner if needed   Subjective: Amy Wolf is an 87 y.o. year old female who is a primary patient of Allwardt, [name] M, PA-C.  The CCM team was consulted for assistance with disease management and care coordination needs.    Engaged with patient by telephone for initial visit in response to provider referral for pharmacy case management and/or care coordination services.   Consent to Services:  The patient was given the following information about Chronic Care Management services today, agreed to services, and gave verbal consent: 1. CCM service includes personalized support from designated clinical staff supervised by the primary care provider, including individualized plan of care and coordination with other care providers 2. 24/7 contact phone numbers for assistance for urgent and routine care needs. 3. Service will only be billed when office clinical staff spend 20 minutes or more in a month to coordinate care. 4. Only one practitioner may furnish and bill the service in a calendar month. 5.The patient may stop CCM services at any time (effective at the end of the month) by phone call to the office staff. 6. The patient will be responsible for cost sharing (co-pay) of up to 20% of the service fee (after annual deductible is met). Patient agreed to  services and consent obtained.  Patient Care Team: Allwardt, Randa Evens, PA-C as PCP - General (Physician Assistant) Constance Haw, MD as PCP - Electrophysiology (Cardiology) Josue Hector, MD as PCP - Cardiology (Cardiology) Katy Apo, MD as Consulting Physician (Ophthalmology) Josue Hector, MD as Consulting Physician (Cardiology) Felipa Furnace, DPM as Consulting Physician (Podiatry) Edythe Clarity, [facility] as Pharmacist (Pharmacist)  Recent office visits:  01/21/2021 [facility] (PCP) Allwardt, Randa Evens, PA-C: chronic follow up, no medication changes indicated.   Recent consult visits:  02/27/2021 [facility] (podiatry) Felipa Furnace, DPM; no medication changes indicated.   01/01/2021 [facility] (cardiology) Laurann Montana S, NP; Continue Eliquis 2.40m BID. Dose reduced due to age, body habitus.    Hospital visits:  None in previous 6 months   Objective:  Lab Results  Component Value Date   CREATININE 1.02 01/21/2021   BUN 25 (H) 01/21/2021   GFR 48.40 (L) 01/21/2021   NA 138 01/21/2021   K 4.4 01/21/2021   CALCIUM 9.1 01/21/2021   CO2 24 01/21/2021   GLUCOSE 108 (H) 01/21/2021    Lab Results  Component Value Date/Time   HGBA1C 6.6 (H) 01/21/2021 03:59 PM   HGBA1C 6.5 07/05/2020 02:08 PM   GFR 48.40 (L) 01/21/2021 03:59 PM   GFR 45.37 (L) 07/05/2020 02:08 PM    Last diabetic Eye exam: No results found for: HMDIABEYEEXA  Last diabetic Foot exam: No results found for: HMDIABFOOTEX   Lab Results  Component Value Date   CHOL 170 04/01/2019   HDL 63.80 04/01/2019   LDLCALC 89 04/01/2019   TRIG 87.0 04/01/2019  CHOLHDL 3 04/01/2019    Hepatic Function Latest Ref Rng & Units 01/21/2021 07/05/2020 04/26/2020  Total Protein 6.0 - 8.3 g/dL 6.9 6.7 6.6  Albumin 3.5 - 5.2 g/dL 4.2 4.1 -  AST 0 - 37 U/L '18 16 21  ' ALT 0 - 35 U/L '16 14 17  ' Alk Phosphatase 39 - 117 U/L 44 42 -  Total Bilirubin 0.2 - 1.2 mg/dL 0.4 0.4 0.4    Lab Results  Component Value Date/Time   TSH 2.41  04/26/2020 09:38 AM    CBC Latest Ref Rng & Units 01/21/2021 07/05/2020 04/26/2020  WBC 4.0 - 10.5 K/uL 7.5 6.4 5.9  Hemoglobin 12.0 - 15.0 g/dL 11.8(L) 11.4(L) 11.4(L)  Hematocrit 36.0 - 46.0 % 34.9(L) 34.5(L) 35.2  Platelets 150.0 - 400.0 K/uL 233.0 235.0 213    No results found for: VD25OH  Clinical ASCVD: Yes  The ASCVD Risk score (Arnett DK, et al., 2019) failed to calculate for the following reasons:   The 2019 ASCVD risk score is only valid for ages 35 to 74    Depression screen PHQ 2/9 11/01/2020 07/05/2020 04/26/2020  Decreased Interest 0 0 0  Down, Depressed, Hopeless 0 0 0  PHQ - 2 Score 0 0 0  Altered sleeping - - -  Tired, decreased energy - - -  Change in appetite - - -  Feeling bad or failure about yourself  - - -  Trouble concentrating - - -  Moving slowly or fidgety/restless - - -  Suicidal thoughts - - -  PHQ-9 Score - - -  Difficult doing work/chores - - -    Social History   Tobacco Use  Smoking Status Never  Smokeless Tobacco Never   BP Readings from Last 3 Encounters:  01/21/21 (!) 146/74  01/01/21 130/80  07/05/20 (!) 81/50   Pulse Readings from Last 3 Encounters:  01/21/21 73  01/01/21 61  07/05/20 66   Wt Readings from Last 3 Encounters:  01/01/21 97 lb 3.2 oz (44.1 kg)  07/05/20 96 lb 6.4 oz (43.7 kg)  05/08/20 99 lb (44.9 kg)   BMI Readings from Last 3 Encounters:  01/21/21 16.68 kg/m  01/01/21 16.68 kg/m  07/05/20 16.55 kg/m    Assessment/Interventions: Review of patient past medical history, allergies, medications, health status, including review of consultants reports, laboratory and other test data, was performed as part of comprehensive evaluation and provision of chronic care management services.   SDOH:  (Social Determinants of Health) assessments and interventions performed: Yes  Financial Resource Strain: Low Risk    Difficulty of Paying Living Expenses: Not hard at all    SDOH Screenings   Alcohol Screen: Not on  file  Depression (PHQ2-9): Low Risk    PHQ-2 Score: 0  Financial Resource Strain: Low Risk    Difficulty of Paying Living Expenses: Not hard at all  Food Insecurity: No Food Insecurity   Worried About Charity fundraiser in the Last Year: Never true   Ran Out of Food in the Last Year: Never true  Housing: Low Risk    Last Housing Risk Score: 0  Physical Activity: Inactive   Days of Exercise per Week: 0 days   Minutes of Exercise per Session: 0 min  Social Connections: Moderately Isolated   Frequency of Communication with Friends and Family: More than three times a week   Frequency of Social Gatherings with Friends and Family: Once a week   Attends Religious Services: Never   Active Member  of Clubs or Organizations: No   Attends Archivist Meetings: Never   Marital Status: Married  Stress: No Stress Concern Present   Feeling of Stress : Not at all  Tobacco Use: Low Risk    Smoking Tobacco Use: Never   Smokeless Tobacco Use: Never  Transportation Needs: No Transportation Needs   Lack of Transportation (Medical): No   Lack of Transportation (Non-Medical): No    CCM Care Plan  Allergies  Allergen Reactions   Iodine Other (See Comments)   Erythromycin Rash   Penicillins Rash   Sulfa Antibiotics Rash    Medications Reviewed Today     Reviewed by Edythe Clarity, [facility] (Pharmacist) on 04/02/21 at [phone]  Med List Status: <None>   Medication Order Taking? Sig Documenting Provider Last Dose Status Informant  acetaminophen (TYLENOL) 500 MG tablet [id number] Yes Take 500 mg by mouth in the morning and at bedtime.  [provider] Taking Active   bimatoprost (LUMIGAN) 0.01 % SOLN [id number] Yes Place 1 drop into both eyes at bedtime. [provider] Taking Active   clopidogrel (PLAVIX) 75 MG tablet [id number] Yes TAKE 1 TABLET BY MOUTH EVERY DAY Allwardt, [name] M, PA-C Taking Active   ELIQUIS 2.5 MG TABS tablet [id number] Yes TAKE 1 TABLET BY MOUTH TWICE A  DAY Camnitz, Will Hassell Done, MD Taking Active   ferrous sulfate 325 (65 FE) MG tablet [id number] Yes Take 325 mg by mouth daily with breakfast.  [provider] Taking Active   glipiZIDE (GLUCOTROL XL) 2.5 MG 24 hr tablet [id number] Yes TAKE 1 TABLET BY MOUTH DAILY WITH BREAKFAST. Orma Flaming, MD Taking Active   Melatonin 3 MG CAPS [id number] Yes Take 3 mg by mouth at bedtime.  [provider] Taking Active   metoprolol tartrate (LOPRESSOR) 50 MG tablet [id number] Yes Take 1 tablet (50 mg total) by mouth 2 (two) times daily. Loel Dubonnet, NP Taking Active   Netarsudil Dimesylate (RHOPRESSA) 0.02 % SOLN [id number] Yes Apply to eye. 1 gtt both eyes q hs [provider] Taking Active Child  pravastatin (PRAVACHOL) 80 MG tablet [id number] Yes TAKE 1 TABLET BY MOUTH EVERY DAY Allwardt, [name] M, PA-C Taking Active   ramipril (ALTACE) 2.5 MG capsule [id number] Yes Take 1 capsule (2.5 mg total) by mouth daily. Orma Flaming, MD Taking Active             Patient Active Problem List   Diagnosis Date Noted   Persistent atrial fibrillation ([place]) 01/23/2020   Secondary hypercoagulable state ([place]) 01/23/2020   Chronic kidney disease (CKD), stage III (moderate) (HCC) 04/04/2019   Benign essential HTN 04/01/2019   Type 2 diabetes mellitus with diabetic chronic kidney disease ([place]) 04/01/2019   CAD (coronary artery disease) 04/01/2019   Hx of CABG 04/01/2019   Paroxysmal A-fib ([place]) 04/01/2019   Atherosclerosis of right carotid artery 04/01/2019   PVD (peripheral vascular disease) ([place]) 04/01/2019   AICD (automatic cardioverter/defibrillator) present 04/01/2019   Valvular heart disease 04/01/2019   CHF (congestive heart failure) ([place]) 04/01/2019   Glaucoma 04/01/2019    Immunization History  Administered Date(s) Administered   Fluad Quad(high Dose 65+) 04/01/2019, 04/05/2020   PFIZER(Purple Top)SARS-COV-2 Vaccination 08/21/2019, 09/13/2019, 05/02/2020   Pneumococcal  Conjugate-13 07/04/2019   Pneumococcal Polysaccharide-23 01/21/2021    Conditions to be addressed/monitored:  HTN, CAD, Afib, CHF, Type II DM  Care Plan : General Pharmacy (Adult)  Updates made by Edythe Clarity, RPH since 04/02/2021 12:00 AM     Problem:  HTN, CAD, Afib, CHF, Type II DM   Priority: High  Onset Date: 09/30/2021     Long-Range Goal: Patient-Specific Goal   Start Date: 04/02/2021  Expected End Date: 09/30/2021  This Visit's Progress: On track  Priority: High  Note:   Current Barriers:  Low Hgb   Pharmacist Clinical Goal(s):  Patient will achieve improvement in Hgb as evidenced by labs through collaboration with PharmD and provider.   Interventions: 1:1 collaboration with Allwardt, Randa Evens, PA-C regarding development and update of comprehensive plan of care as evidenced by provider attestation and co-signature Inter-disciplinary care team collaboration (see longitudinal plan of care) Comprehensive medication review performed; medication list updated in electronic medical record  Hypertension (BP goal <140/90) -Controlled -Current treatment: Metoprolol tartrate 79m BID Ramipril 2.561mdaily -Medications previously tried: amlodipine, Multaq  -Current home readings: checks occasionally, "normal" -Current dietary habits: reports good appetite -Current exercise habits: minimal, mostly sedentary -Denies hypotensive/hypertensive symptoms -Educated on BP goals and benefits of medications for prevention of heart attack, stroke and kidney damage; Importance of home blood pressure monitoring; Symptoms of hypotension and importance of maintaining adequate hydration; -Counseled to monitor BP at home weekly, document, and provide log at future appointments -Recommended to continue current medication  Diabetes (A1c goal <8%) -Controlled -Current medications: Glipizide XL 2.51m19maily -Medications previously tried: none noted  -Current home glucose readings fasting  glucose: not checking post prandial glucose: not checking -Denies hypoglycemic/hyperglycemic symptoms -Educated on A1c and blood sugar goals; Prevention and management of hypoglycemic episodes; MOA of Glipizide and the fast blood sugar lowering, recommended having something on hand for fast acting sugars such as OJ, glucose tabs -Counseled to check feet daily and get yearly eye exams -Recommended to continue current medication Consider d/c of glipizide based on future A1c's and symptomology at home due to risk of hypoglycemia and controlled A1c  Atrial Fibrillation (Goal: prevent stroke and major bleeding) -Controlled -CHADSVASC: 7 -Current treatment: Rate control: Metoprolol tartrate 75m71mD Anticoagulation: Eliquis 2.51mg 51m -Medications previously tried: none noted -Home BP and HR readings: "normal"  -Counseled on increased risk of stroke due to Afib and benefits of anticoagulation for stroke prevention; bleeding risk associated with Eliquis and importance of self-monitoring for signs/symptoms of bleeding; Discussed use of Eliquis and Clopidogrel, concerned with her age and increased fall risk. She has also had a historical low Hgb the last year consistently. -Recommended to continue current medication Will collaborate with PCP on risk/benefit of two anticoagulants here, may need to also discuss with cardiology.  Hyperlipidemia/CAD: (LDL goal < 70) -Controlled -Current treatment: Pravastatin 80mg 23my Clopidogrel 751mg d46m -Medications previously tried: none noted   -Educated on Cholesterol goals;  Benefits of statin for ASCVD risk reduction; Importance of limiting foods high in cholesterol; Discussed use of both Eliquis and Plavix as above. -Lipid panel is controlled -Recommended to continue current medication Will discuss dual anticoagulants with PCP.   Patient Goals/Self-Care Activities Patient will:  - take medications as prescribed check blood pressure  periodically, document, and provide at future appointments Prevent episodes of hypoglycemia and notify providers if any do occur.  Follow Up Plan: The care management team will reach out to the patient again over the next 180 days.          Medication Assistance:  Eliquisobtained through [employer] medication assistance program.  Enrollment ends 07/06/21  Compliance/Adherence/Medication fill history: Care Gaps: Eye exam, foot exam, DEXA  Star-Rating Drugs: Pravastatin 80 mg last filled 12/31/2020 90 DS Ramipril 2.5 mg last filled  03/01/2021 90 DS  Patient's preferred pharmacy is:  [id number] [place], [place] - [address] [place] [address]Phone: [phone]: [phone] Uses pill box? Yes - organizes herself Pt endorses 100% compliance  We discussed: Benefits of medication synchronization, packaging and delivery as well as enhanced pharmacist oversight with [employer]. Patient decided to:  Continue her own methods  Care Plan and Follow Up Patient Decision:  Patient agrees to Care Plan and Follow-up.  Plan: The care management team will reach out to the patient again over the next 180 days.  CBeverly Milch PharmD Clinical Pharmacist ([phone]

## 2021-04-02 ENCOUNTER — Ambulatory Visit (INDEPENDENT_AMBULATORY_CARE_PROVIDER_SITE_OTHER): Payer: PPO | Admitting: Pharmacist

## 2021-04-02 ENCOUNTER — Other Ambulatory Visit (HOSPITAL_COMMUNITY): Payer: Self-pay | Admitting: Cardiovascular Disease

## 2021-04-02 DIAGNOSIS — I6523 Occlusion and stenosis of bilateral carotid arteries: Secondary | ICD-10-CM

## 2021-04-02 DIAGNOSIS — I4819 Other persistent atrial fibrillation: Secondary | ICD-10-CM

## 2021-04-02 DIAGNOSIS — I10 Essential (primary) hypertension: Secondary | ICD-10-CM

## 2021-04-02 DIAGNOSIS — I2510 Atherosclerotic heart disease of native coronary artery without angina pectoris: Secondary | ICD-10-CM

## 2021-04-02 DIAGNOSIS — E1122 Type 2 diabetes mellitus with diabetic chronic kidney disease: Secondary | ICD-10-CM

## 2021-04-02 NOTE — Patient Instructions (Addendum)
Visit Information Thank you for meeting with me today!  I look forward to working with you to help you meet all of your healthcare goals and answer any questions you may have.  Feel free to contact me anytime!   Goals Addressed             This Visit's Progress    Monitor and Manage My Blood Sugar-Diabetes Type 2       Timeframe:  Long-Range Goal Priority:  High Start Date:  04/02/21                           Expected End Date:   09/30/21                    Follow Up Date 07/02/21    - check blood sugar at prescribed times - check blood sugar if I feel it is too high or too low - take the blood sugar log to all doctor visits    Why is this important?   Checking your blood sugar at home helps to keep it from getting very high or very low.  Writing the results in a diary or log helps the doctor know how to care for you.  Your blood sugar log should have the time, date and the results.  Also, write down the amount of insulin or other medicine that you take.  Other information, like what you ate, exercise done and how you were feeling, will also be helpful.     Notes:        Patient Care Plan: General Pharmacy (Adult)     Problem Identified: HTN, CAD, Afib, CHF, Type II DM   Priority: High  Onset Date: 09/30/2021     Long-Range Goal: Patient-Specific Goal   Start Date: 04/02/2021  Expected End Date: 09/30/2021  This Visit's Progress: On track  Priority: High  Note:   Current Barriers:  Low Hgb   Pharmacist Clinical Goal(s):  Patient will achieve improvement in Hgb as evidenced by labs through collaboration with PharmD and provider.   Interventions: 1:1 collaboration with Allwardt, Randa Evens, PA-C regarding development and update of comprehensive plan of care as evidenced by provider attestation and co-signature Inter-disciplinary care team collaboration (see longitudinal plan of care) Comprehensive medication review performed; medication list updated in electronic  medical record  Hypertension (BP goal <140/90) -Controlled -Current treatment: Metoprolol tartrate 50mg  BID Ramipril 2.5mg  daily -Medications previously tried: amlodipine, Multaq  -Current home readings: checks occasionally, "normal" -Current dietary habits: reports good appetite -Current exercise habits: minimal, mostly sedentary -Denies hypotensive/hypertensive symptoms -Educated on BP goals and benefits of medications for prevention of heart attack, stroke and kidney damage; Importance of home blood pressure monitoring; Symptoms of hypotension and importance of maintaining adequate hydration; -Counseled to monitor BP at home weekly, document, and provide log at future appointments -Recommended to continue current medication  Diabetes (A1c goal <8%) -Controlled -Current medications: Glipizide XL 2.5mg  daily -Medications previously tried: none noted  -Current home glucose readings fasting glucose: not checking post prandial glucose: not checking -Denies hypoglycemic/hyperglycemic symptoms -Educated on A1c and blood sugar goals; Prevention and management of hypoglycemic episodes; MOA of Glipizide and the fast blood sugar lowering, recommended having something on hand for fast acting sugars such as OJ, glucose tabs -Counseled to check feet daily and get yearly eye exams -Recommended to continue current medication Consider d/c of glipizide based on future A1c's and symptomology at home due  to risk of hypoglycemia and controlled A1c  Atrial Fibrillation (Goal: prevent stroke and major bleeding) -Controlled -CHADSVASC: 7 -Current treatment: Rate control: Metoprolol tartrate 50mg  BID Anticoagulation: Eliquis 2.5mg  BID -Medications previously tried: none noted -Home BP and HR readings: "normal"  -Counseled on increased risk of stroke due to Afib and benefits of anticoagulation for stroke prevention; bleeding risk associated with Eliquis and importance of self-monitoring for  signs/symptoms of bleeding; Discussed use of Eliquis and Clopidogrel, concerned with her age and increased fall risk. She has also had a historical low Hgb the last year consistently. -Recommended to continue current medication Will collaborate with PCP on risk/benefit of two anticoagulants here, may need to also discuss with cardiology.  Hyperlipidemia/CAD: (LDL goal < 70) -Controlled -Current treatment: Pravastatin 80mg  daily Clopidogrel 75mg  daily -Medications previously tried: none noted   -Educated on Cholesterol goals;  Benefits of statin for ASCVD risk reduction; Importance of limiting foods high in cholesterol; Discussed use of both Eliquis and Plavix as above. -Lipid panel is controlled -Recommended to continue current medication Will discuss dual anticoagulants with PCP.   Patient Goals/Self-Care Activities Patient will:  - take medications as prescribed check blood pressure periodically, document, and provide at future appointments Prevent episodes of hypoglycemia and notify providers if any do occur.  Follow Up Plan: The care management team will reach out to the patient again over the next 180 days.         Amy Wolf was given information about Chronic Care Management services today including:  CCM service includes personalized support from designated clinical staff supervised by her physician, including individualized plan of care and coordination with other care providers 24/7 contact phone numbers for assistance for urgent and routine care needs. Standard insurance, coinsurance, copays and deductibles apply for chronic care management only during months in which we provide at least 20 minutes of these services. Most insurances cover these services at 100%, however patients may be responsible for any copay, coinsurance and/or deductible if applicable. This service may help you avoid the need for more expensive face-to-face services. Only one practitioner may  furnish and bill the service in a calendar month. The patient may stop CCM services at any time (effective at the end of the month) by phone call to the office staff.  Patient agreed to services and verbal consent obtained.   The patient verbalized understanding of instructions, educational materials, and care plan provided today and agreed to receive a mailed copy of patient instructions, educational materials, and care plan.  Telephone follow up appointment with pharmacy team member scheduled for: 6 months  Edythe Clarity, [place]

## 2021-04-04 ENCOUNTER — Telehealth: Payer: Self-pay | Admitting: Pharmacist

## 2021-04-04 NOTE — Telephone Encounter (Signed)
-----   Message from Will Meredith Leeds, MD sent at 04/04/2021  4:28 PM EDT ----- Yes thank you, that would be great. ----- Message ----- From: Edythe Clarity, [facility] Sent: 04/04/2021   4:25 PM EDT To: Constance Haw, MD  Sounds good.  Do you want me to reach out to the patient?  ----- Message ----- From: Constance Haw, MD Sent: 04/04/2021   4:04 PM EDT To: Stanton Kidney, RN, Edythe Clarity, [facility]  Thanks for pointing this out. Will plan to stop plavix and continue eliquis for AF. ----- Message ----- From: Edythe Clarity, [facility] Sent: 04/03/2021   3:29 PM EDT To: Will Meredith Leeds, MD  Dr. Curt Bears,  I saw Amy Wolf for CCM at her PCP office yesterday and just wanted to clarify this and confirm with you.  Initial visit with PharmD. Patient 87 yo with history of falls currently on Eliquis (dosed appropriately for age/weight) and Clopidogrel. Low Hgb for the past year. Does have significant cardiac history.  I wanted to confirm she was supposed to be on both Clopidogrel and Eliquis noting her recent low Hgb and falls reported by daughter.  Thanks Beverly Milch, PharmD Clinical Pharmacist [phone]

## 2021-04-04 NOTE — Chronic Care Management (AMB) (Signed)
Contacted patient to let them know cardiology decision.  Spoke to daughter [name]  Stop Plavix today and continue only Eliquis 2.5mg  BID for Afib.  Beverly Milch, PharmD Clinical Pharmacist [place] [phone]

## 2021-04-05 DIAGNOSIS — I10 Essential (primary) hypertension: Secondary | ICD-10-CM

## 2021-04-05 DIAGNOSIS — E1122 Type 2 diabetes mellitus with diabetic chronic kidney disease: Secondary | ICD-10-CM | POA: Diagnosis not present

## 2021-04-05 DIAGNOSIS — I2510 Atherosclerotic heart disease of native coronary artery without angina pectoris: Secondary | ICD-10-CM

## 2021-04-05 DIAGNOSIS — I4819 Other persistent atrial fibrillation: Secondary | ICD-10-CM | POA: Diagnosis not present

## 2021-04-09 ENCOUNTER — Other Ambulatory Visit: Payer: Self-pay

## 2021-04-09 ENCOUNTER — Ambulatory Visit (HOSPITAL_COMMUNITY)
Admission: RE | Admit: 2021-04-09 | Discharge: 2021-04-09 | Disposition: A | Discharge status: Home / Self Care | Payer: PPO | Source: Ambulatory Visit | Attending: Cardiovascular Disease | Admitting: Cardiovascular Disease

## 2021-04-09 DIAGNOSIS — I6523 Occlusion and stenosis of bilateral carotid arteries: Secondary | ICD-10-CM

## 2021-04-10 ENCOUNTER — Telehealth: Payer: Self-pay | Admitting: Pharmacist

## 2021-04-10 NOTE — Chronic Care Management (AMB) (Signed)
Informed patient of Dr. Curt Bears advice to stop Plavix.  Spoke with daughter.

## 2021-04-10 NOTE — Telephone Encounter (Signed)
-----   Message from Fredirick Lathe, PA-C sent at 04/04/2021  7:52 PM EDT ----- Doristine Devoid catch, thank you for taking good care of our patients!  [name]  ----- Message ----- From: Edythe Clarity, [facility] Sent: 04/04/2021   4:25 PM EDT To: Randa Evens Allwardt, PA-C  Just passing this along.  Looks like they are going to stop the Plavix.   ----- Message ----- From: Constance Haw, MD Sent: 04/04/2021   4:04 PM EDT To: Stanton Kidney, RN, Edythe Clarity, [facility]  Thanks for pointing this out. Will plan to stop plavix and continue eliquis for AF. ----- Message ----- From: Edythe Clarity, [facility] Sent: 04/03/2021   3:29 PM EDT To: Will Meredith Leeds, MD  Dr. Curt Bears,  I saw Mrs. Robarts for CCM at her PCP office yesterday and just wanted to clarify this and confirm with you.  Initial visit with PharmD. Patient 87 yo with history of falls currently on Eliquis (dosed appropriately for age/weight) and Clopidogrel. Low Hgb for the past year. Does have significant cardiac history.  I wanted to confirm she was supposed to be on both Clopidogrel and Eliquis noting her recent low Hgb and falls reported by daughter.  Thanks Beverly Milch, PharmD Clinical Pharmacist [phone]

## 2021-04-12 ENCOUNTER — Telehealth: Payer: Self-pay | Admitting: Pharmacist

## 2021-04-12 NOTE — Progress Notes (Signed)
    Chronic Care Management Pharmacy Assistant   Name: Amy Wolf  MRN: [id number] DOB: 07/11/1927  Reason for Encounter: CCM Care Plans  Medications: Outpatient Encounter Medications as of 04/12/2021  Medication Sig   acetaminophen (TYLENOL) 500 MG tablet Take 500 mg by mouth in the morning and at bedtime.    bimatoprost (LUMIGAN) 0.01 % SOLN Place 1 drop into both eyes at bedtime.   ELIQUIS 2.5 MG TABS tablet TAKE 1 TABLET BY MOUTH TWICE A DAY   ferrous sulfate 325 (65 FE) MG tablet Take 325 mg by mouth daily with breakfast.    glipiZIDE (GLUCOTROL XL) 2.5 MG 24 hr tablet TAKE 1 TABLET BY MOUTH DAILY WITH BREAKFAST.   Melatonin 3 MG CAPS Take 3 mg by mouth at bedtime.    metoprolol tartrate (LOPRESSOR) 50 MG tablet Take 1 tablet (50 mg total) by mouth 2 (two) times daily.   Netarsudil Dimesylate (RHOPRESSA) 0.02 % SOLN Apply to eye. 1 gtt both eyes q hs   pravastatin (PRAVACHOL) 80 MG tablet TAKE 1 TABLET BY MOUTH EVERY DAY   ramipril (ALTACE) 2.5 MG capsule Take 1 capsule (2.5 mg total) by mouth daily.   No facility-administered encounter medications on file as of 04/12/2021.   Reviewed the patients initial visit reinsured it was completed per the pharmacist Leata Mouse request. Printed the CCM care plan. Mailed the patient CCM care plan to their most recent address on file.  Follow-Up:Pharmacist Review  Charlann Lange, [place] Pharmacist Assistant [phone]

## 2021-04-16 ENCOUNTER — Ambulatory Visit (INDEPENDENT_AMBULATORY_CARE_PROVIDER_SITE_OTHER): Payer: PPO

## 2021-04-16 ENCOUNTER — Other Ambulatory Visit: Payer: Self-pay

## 2021-04-16 DIAGNOSIS — Z23 Encounter for immunization: Secondary | ICD-10-CM | POA: Diagnosis not present

## 2021-04-18 ENCOUNTER — Other Ambulatory Visit: Payer: Self-pay | Admitting: Family Medicine

## 2021-04-19 ENCOUNTER — Ambulatory Visit (INDEPENDENT_AMBULATORY_CARE_PROVIDER_SITE_OTHER): Payer: PPO

## 2021-04-19 DIAGNOSIS — I4821 Permanent atrial fibrillation: Secondary | ICD-10-CM

## 2021-04-23 ENCOUNTER — Telehealth: Payer: Self-pay

## 2021-04-23 LAB — CUP PACEART REMOTE DEVICE CHECK
Battery Remaining Longevity: 8 mo
Battery Voltage: 2.84 V
Brady Statistic RA Percent Paced: 0.28 %
Brady Statistic RV Percent Paced: 70.04 %
Date Time Interrogation Session: 20221014022603
HighPow Impedance: 52 Ohm
Implantable Lead Implant Date: 20070502
Implantable Lead Implant Date: 20150205
Implantable Lead Location: 753859
Implantable Lead Location: 753860
Implantable Lead Special Function: 2015
Implantable Pulse Generator Implant Date: 20150205
Lead Channel Impedance Value: 285 Ohm
Lead Channel Impedance Value: 361 Ohm
Lead Channel Impedance Value: 456 Ohm
Lead Channel Pacing Threshold Amplitude: 0.625 V
Lead Channel Pacing Threshold Amplitude: 0.75 V
Lead Channel Pacing Threshold Pulse Width: 0.4 ms
Lead Channel Pacing Threshold Pulse Width: 0.4 ms
Lead Channel Sensing Intrinsic Amplitude: 1.5 mV
Lead Channel Sensing Intrinsic Amplitude: 1.5 mV
Lead Channel Sensing Intrinsic Amplitude: 9.375 mV
Lead Channel Sensing Intrinsic Amplitude: 9.375 mV
Lead Channel Setting Pacing Amplitude: 1.75 V
Lead Channel Setting Pacing Amplitude: 2 V
Lead Channel Setting Pacing Pulse Width: 0.4 ms
Lead Channel Setting Sensing Sensitivity: 0.3 mV

## 2021-04-23 NOTE — Telephone Encounter (Signed)
Scheduled remote reviewed. Normal device function.   Chronic AF, Eliquis prescribed Battery 64mo, route to triage Next remote to be determined  Successful telephone encounter to patient's daughter to discuss device battery status and depletion. Informed daughter that patient would be scheduled for monthly remote battery checks beginning in December. Of note confirmed patient appointment with Dr. Curt Bears 05/07/21. Device will also be interrogated at this time and battery life noted. Daughter appreciative of call.

## 2021-04-29 NOTE — Progress Notes (Signed)
Remote ICD transmission.   

## 2021-05-07 ENCOUNTER — Encounter: Payer: Self-pay | Admitting: Cardiology

## 2021-05-07 ENCOUNTER — Ambulatory Visit: Payer: PPO | Admitting: Cardiology

## 2021-05-07 ENCOUNTER — Other Ambulatory Visit: Payer: Self-pay

## 2021-05-07 VITALS — BP 98/62 | HR 72 | Ht 65.0 in | Wt 98.6 lb

## 2021-05-07 DIAGNOSIS — I5022 Chronic systolic (congestive) heart failure: Secondary | ICD-10-CM

## 2021-05-07 NOTE — Progress Notes (Signed)
Electrophysiology Office Note   Date:  05/07/2021   ID:  Amy Wolf, DOB 05-31-1928, MRN [id number]  PCP:  Fredirick Lathe, PA-C  Cardiologist:  Johnsie Cancel Primary Electrophysiologist:  Rey Fors Meredith Leeds, MD    Chief Complaint: ICD   History of Present Illness: Amy Wolf is a 87 y.o. female who is being seen today for the evaluation of ICD at the request of Amy Flaming, MD. Presenting today for electrophysiology evaluation.  He has a history significant for hypertension, diabetes, coronary artery disease, anemia, carotid artery disease.  She has had mitral and tricuspid annuloplasty rings at the time of her CABG.  She also has a likely ischemic cardiomyopathy with a Medtronic ICD.  She has permanent atrial fibrillation.  Today, denies symptoms of palpitations, chest pain, shortness of breath, orthopnea, PND, lower extremity edema, claudication, dizziness, presyncope, syncope, bleeding, or neurologic sequela. The patient is tolerating medications without difficulties.  Since being seen she has done well.  She has had no chest pain or shortness of breath.  She is able to do all of her daily activities and is without restriction.  She uses a walker at home but is in a wheelchair today just due to the distance from the car to the office.   Past Medical History:  Diagnosis Date   AICD (automatic cardioverter/defibrillator) present    Arrhythmia    Arthritis    Atherosclerosis of right carotid artery 04/01/2019   80% blockage. Last ultrasound in 12/2018. Saw vascular surgery in June 2020. Put on DPT in June (asa added by surgeon)   Benign essential HTN 04/01/2019   CAD (coronary artery disease) 04/01/2019   Cardiac arrhythmia due to congenital heart disease    CHF (congestive heart failure) ([place]) 04/01/2019   Chronic kidney disease (CKD), stage III (moderate) ([place]) 04/04/2019   Diabetes ([place])    Glaucoma    Heart murmur    Hx of CABG 04/01/2019   Several years ago per family     Hyperlipemia    Migraines    Pacemaker    Paroxysmal A-fib ([place]) 04/01/2019   warafin in the remote past, but changed to plavix. CHADS2 score of 8%. High fall risk...    PVD (peripheral vascular disease) ([place]) 04/01/2019   Type 2 diabetes mellitus with diabetic chronic kidney disease ([place]) 04/01/2019   Urine incontinence    Valvular heart disease 04/01/2019   Mitral and tricuspid annuloplasty    Past Surgical History:  Procedure Laterality Date   ABDOMINAL HYSTERECTOMY     BACK SURGERY  1980   CARDIAC DEFIBRILLATOR PLACEMENT     CORONARY ARTERY BYPASS GRAFT     HIP FRACTURE SURGERY  2015   PPM GENERATOR CHANGEOUT       Current Outpatient Medications  Medication Sig Dispense Refill   acetaminophen (TYLENOL) 500 MG tablet Take 500 mg by mouth in the morning and at bedtime.      bimatoprost (LUMIGAN) 0.01 % SOLN Place 1 drop into both eyes at bedtime.     brimonidine (ALPHAGAN) 0.15 % ophthalmic solution 1 drop 3 (three) times daily.     ELIQUIS 2.5 MG TABS tablet TAKE 1 TABLET BY MOUTH TWICE A DAY 60 tablet 5   ferrous sulfate 325 (65 FE) MG tablet Take 325 mg by mouth daily with breakfast.      glipiZIDE (GLUCOTROL XL) 2.5 MG 24 hr tablet TAKE 1 TABLET BY MOUTH EVERY DAY WITH BREAKFAST 90 tablet 1   Melatonin 3 MG CAPS Take  3 mg by mouth at bedtime.      metoprolol tartrate (LOPRESSOR) 50 MG tablet Take 1 tablet (50 mg total) by mouth 2 (two) times daily. 90 tablet 3   Netarsudil Dimesylate (RHOPRESSA) 0.02 % SOLN Apply to eye. 1 gtt both eyes q hs     pravastatin (PRAVACHOL) 80 MG tablet TAKE 1 TABLET BY MOUTH EVERY DAY 90 tablet 0   ramipril (ALTACE) 2.5 MG capsule Take 1 capsule (2.5 mg total) by mouth daily. 90 capsule 3   No current facility-administered medications for this visit.    Allergies:   Iodine, Erythromycin, Penicillins, and Sulfa antibiotics   Social History:  The patient  reports that she has never smoked. She has never used smokeless tobacco. She reports that  she does not currently use alcohol. She reports that she does not use drugs.   Family History:  The patient's family history includes Cancer in her daughter, father, and sister.   ROS:  Please see the history of present illness.   Otherwise, review of systems is positive for none.   All other systems are reviewed and negative.   PHYSICAL EXAM: VS:  BP 98/62   Pulse 72   Ht 5\' 5"  (1.651 m)   Wt 98 lb 9.6 oz (44.7 kg)   SpO2 98%   BMI 16.41 kg/m  , BMI Body mass index is 16.41 kg/m. GEN: Well nourished, well developed, in no acute distress  HEENT: normal  Neck: no JVD, carotid bruits, or masses Cardiac: RRR; no murmurs, rubs, or gallops,no edema  Respiratory:  clear to auscultation bilaterally, normal work of breathing GI: soft, nontender, nondistended, + BS MS: no deformity or atrophy  Skin: warm and dry, device site well healed Neuro:  Strength and sensation are intact Psych: euthymic mood, full affect  EKG:  EKG is ordered today. Personal review of the ekg ordered shows AF, V paced  Personal review of the device interrogation today. Results in [facility]: 01/21/2021: ALT 16; BUN 25; Creatinine, Ser 1.02; Hemoglobin 11.8; Platelets 233.0; Potassium 4.4; Sodium 138    Lipid Panel     Component Value Date/Time   CHOL 170 04/01/2019 1133   TRIG 87.0 04/01/2019 1133   HDL 63.80 04/01/2019 1133   CHOLHDL 3 04/01/2019 1133   VLDL 17.4 04/01/2019 1133   LDLCALC 89 04/01/2019 1133     Wt Readings from Last 3 Encounters:  05/07/21 98 lb 9.6 oz (44.7 kg)  01/01/21 97 lb 3.2 oz (44.1 kg)  07/05/20 96 lb 6.4 oz (43.7 kg)      Other studies Reviewed: Additional studies/ records that were reviewed today include: TTE 04/12/19  Review of the above records today demonstrates:   1. Left ventricular ejection fraction, by visual estimation, is 30%. The left ventricle has normal function. Normal left ventricular size. There is mildly increased left ventricular  hypertrophy. Septal-lateral dyssynchrony with diffuse hypokinesis.  2. Left ventricular diastolic Doppler parameters are indeterminate pattern of LV diastolic filling.  3. S/p tricuspid valve repair. No more than moderate tricuspid regurgitation visually though thick CW doppler jet raises concern for more significant tricuspid regurgitation.  4. S/p mitral valve repair. No more than moderate mitral regurgitation visually, though thick CW doppler jet raises concern for more significant mitral regurgitation. Mean gradient 3 mmHg across the mitral valve, no evidence for significant stenosis.  5. The aortic valve is tricuspid Aortic valve regurgitation is trivial by color flow Doppler. Mild aortic valve sclerosis without stenosis.  6. Global right ventricle has mildly reduced systolic function.The right ventricular size is mildly enlarged. No increase in right ventricular wall thickness.  7. A pacer wire is visualized in the RV.  8. The tricuspid valve is normal in structure. Tricuspid valve regurgitation is mild.  9. Right atrial size was moderately dilated. 10. Left atrial size was severely dilated. 11. The inferior vena cava is dilated in size with >50% respiratory variability, suggesting right atrial pressure of 8 mmHg. 12. The tricuspid regurgitant velocity is 3.35 m/s, and with an assumed right atrial pressure of 8 mmHg, the estimated right ventricular systolic pressure is moderately elevated at 52.9 mmHg.   ASSESSMENT AND PLAN:  1.  Coronary artery disease: Status post CABG.  No current chest pain.  2.  Valvular heart disease: Status post tricuspid and mitral valve annuloplasty.  Echo with stable results.  Plan per primary cardiology.  3.  Chronic systolic heart failure: Likely due to ischemic cardiomyopathy.  Status post Medtronic ICD.  Device functioning appropriately.  She has 7 months left on her battery.  We did discuss the possibility of not doing a generator change once her battery is  EOL.  Her and her family Sahand Gosch talk about this further.  4.  Permanent atrial fibrillation: Currently on Eliquis and Plavix.  CHA2DS2-VASc of 8.  Unaware of her atrial fibrillation.  No changes.  Current medicines are reviewed at length with the patient today.   The patient does not have concerns regarding her medicines.  The following changes were made today: None  Labs/ tests ordered today include:  Orders Placed This Encounter  Procedures   EKG 12-Lead      Disposition:   FU with Layah Skousen 1 year  Signed, Astria Jordahl Meredith Leeds, MD  05/07/2021 2:21 PM     [facility] [address] [place] [address] [phone] (office) [phone] (fax)

## 2021-05-24 ENCOUNTER — Telehealth: Payer: PPO

## 2021-05-24 NOTE — Telephone Encounter (Signed)
**Note De-Identified  Obfuscation** The providers page of the pts [employer] application for Eliquis was left at the office with a request for [place] to fax to [employer] once completed.  I have completed the providers page and emailed to Dr [employer] nurse so she can obtain his signature, date it, and to fax to [facility] at the fax number written on the cover letter included or to place in Medical Records to be faxed.  I did s/w her daughter (DPR) who states that they are sending the pts part of the application to [employer].

## 2021-05-26 ENCOUNTER — Other Ambulatory Visit: Payer: Self-pay

## 2021-05-26 ENCOUNTER — Inpatient Hospital Stay (HOSPITAL_COMMUNITY)
Admission: EM | Admit: 2021-05-26 | Discharge: 2021-06-06 | DRG: 177 | Disposition: A | Discharge status: Skilled Nursing Facility | Payer: PPO | Attending: Internal Medicine | Admitting: Internal Medicine

## 2021-05-26 ENCOUNTER — Encounter (HOSPITAL_COMMUNITY): Payer: Self-pay

## 2021-05-26 ENCOUNTER — Emergency Department (HOSPITAL_COMMUNITY): Payer: PPO

## 2021-05-26 DIAGNOSIS — Z681 Body mass index (BMI) 19 or less, adult: Secondary | ICD-10-CM | POA: Diagnosis not present

## 2021-05-26 DIAGNOSIS — Z7901 Long term (current) use of anticoagulants: Secondary | ICD-10-CM

## 2021-05-26 DIAGNOSIS — R627 Adult failure to thrive: Secondary | ICD-10-CM | POA: Diagnosis not present

## 2021-05-26 DIAGNOSIS — I358 Other nonrheumatic aortic valve disorders: Secondary | ICD-10-CM | POA: Diagnosis not present

## 2021-05-26 DIAGNOSIS — I5021 Acute systolic (congestive) heart failure: Secondary | ICD-10-CM | POA: Diagnosis not present

## 2021-05-26 DIAGNOSIS — Z794 Long term (current) use of insulin: Secondary | ICD-10-CM | POA: Diagnosis not present

## 2021-05-26 DIAGNOSIS — I2510 Atherosclerotic heart disease of native coronary artery without angina pectoris: Secondary | ICD-10-CM | POA: Diagnosis present

## 2021-05-26 DIAGNOSIS — J069 Acute upper respiratory infection, unspecified: Secondary | ICD-10-CM | POA: Diagnosis present

## 2021-05-26 DIAGNOSIS — E1122 Type 2 diabetes mellitus with diabetic chronic kidney disease: Secondary | ICD-10-CM | POA: Diagnosis present

## 2021-05-26 DIAGNOSIS — Z90710 Acquired absence of both cervix and uterus: Secondary | ICD-10-CM

## 2021-05-26 DIAGNOSIS — I4821 Permanent atrial fibrillation: Secondary | ICD-10-CM | POA: Diagnosis not present

## 2021-05-26 DIAGNOSIS — Z7984 Long term (current) use of oral hypoglycemic drugs: Secondary | ICD-10-CM

## 2021-05-26 DIAGNOSIS — E1165 Type 2 diabetes mellitus with hyperglycemia: Secondary | ICD-10-CM | POA: Diagnosis present

## 2021-05-26 DIAGNOSIS — J9601 Acute respiratory failure with hypoxia: Secondary | ICD-10-CM | POA: Diagnosis present

## 2021-05-26 DIAGNOSIS — I255 Ischemic cardiomyopathy: Secondary | ICD-10-CM | POA: Diagnosis not present

## 2021-05-26 DIAGNOSIS — I130 Hypertensive heart and chronic kidney disease with heart failure and stage 1 through stage 4 chronic kidney disease, or unspecified chronic kidney disease: Secondary | ICD-10-CM | POA: Diagnosis present

## 2021-05-26 DIAGNOSIS — I351 Nonrheumatic aortic (valve) insufficiency: Secondary | ICD-10-CM | POA: Diagnosis present

## 2021-05-26 DIAGNOSIS — Z951 Presence of aortocoronary bypass graft: Secondary | ICD-10-CM

## 2021-05-26 DIAGNOSIS — I4819 Other persistent atrial fibrillation: Secondary | ICD-10-CM | POA: Diagnosis present

## 2021-05-26 DIAGNOSIS — H409 Unspecified glaucoma: Secondary | ICD-10-CM | POA: Diagnosis not present

## 2021-05-26 DIAGNOSIS — R0602 Shortness of breath: Secondary | ICD-10-CM | POA: Diagnosis not present

## 2021-05-26 DIAGNOSIS — E785 Hyperlipidemia, unspecified: Secondary | ICD-10-CM | POA: Diagnosis present

## 2021-05-26 DIAGNOSIS — R0902 Hypoxemia: Secondary | ICD-10-CM | POA: Diagnosis not present

## 2021-05-26 DIAGNOSIS — I5023 Acute on chronic systolic (congestive) heart failure: Secondary | ICD-10-CM | POA: Diagnosis not present

## 2021-05-26 DIAGNOSIS — U071 COVID-19: Principal | ICD-10-CM | POA: Diagnosis present

## 2021-05-26 DIAGNOSIS — I110 Hypertensive heart disease with heart failure: Secondary | ICD-10-CM | POA: Diagnosis not present

## 2021-05-26 DIAGNOSIS — Z79899 Other long term (current) drug therapy: Secondary | ICD-10-CM

## 2021-05-26 DIAGNOSIS — R319 Hematuria, unspecified: Secondary | ICD-10-CM | POA: Diagnosis present

## 2021-05-26 DIAGNOSIS — D638 Anemia in other chronic diseases classified elsewhere: Secondary | ICD-10-CM | POA: Diagnosis present

## 2021-05-26 DIAGNOSIS — E876 Hypokalemia: Secondary | ICD-10-CM | POA: Diagnosis not present

## 2021-05-26 DIAGNOSIS — E1151 Type 2 diabetes mellitus with diabetic peripheral angiopathy without gangrene: Secondary | ICD-10-CM | POA: Diagnosis not present

## 2021-05-26 DIAGNOSIS — R32 Unspecified urinary incontinence: Secondary | ICD-10-CM | POA: Diagnosis present

## 2021-05-26 DIAGNOSIS — E43 Unspecified severe protein-calorie malnutrition: Secondary | ICD-10-CM | POA: Diagnosis present

## 2021-05-26 DIAGNOSIS — J189 Pneumonia, unspecified organism: Secondary | ICD-10-CM | POA: Diagnosis not present

## 2021-05-26 DIAGNOSIS — I4891 Unspecified atrial fibrillation: Secondary | ICD-10-CM | POA: Diagnosis not present

## 2021-05-26 DIAGNOSIS — Z7189 Other specified counseling: Secondary | ICD-10-CM | POA: Diagnosis not present

## 2021-05-26 DIAGNOSIS — I10 Essential (primary) hypertension: Secondary | ICD-10-CM | POA: Diagnosis present

## 2021-05-26 DIAGNOSIS — N1831 Chronic kidney disease, stage 3a: Secondary | ICD-10-CM | POA: Diagnosis present

## 2021-05-26 DIAGNOSIS — I517 Cardiomegaly: Secondary | ICD-10-CM | POA: Diagnosis not present

## 2021-05-26 DIAGNOSIS — I5022 Chronic systolic (congestive) heart failure: Secondary | ICD-10-CM

## 2021-05-26 DIAGNOSIS — R079 Chest pain, unspecified: Secondary | ICD-10-CM | POA: Diagnosis not present

## 2021-05-26 DIAGNOSIS — R739 Hyperglycemia, unspecified: Secondary | ICD-10-CM | POA: Diagnosis not present

## 2021-05-26 DIAGNOSIS — Z95810 Presence of automatic (implantable) cardiac defibrillator: Secondary | ICD-10-CM

## 2021-05-26 DIAGNOSIS — R0689 Other abnormalities of breathing: Secondary | ICD-10-CM | POA: Diagnosis not present

## 2021-05-26 DIAGNOSIS — Z809 Family history of malignant neoplasm, unspecified: Secondary | ICD-10-CM

## 2021-05-26 LAB — CBC WITH DIFFERENTIAL/PLATELET
Abs Immature Granulocytes: 0.04 10*3/uL (ref 0.00–0.07)
Basophils Absolute: 0 10*3/uL (ref 0.0–0.1)
Basophils Relative: 0 %
Eosinophils Absolute: 0 10*3/uL (ref 0.0–0.5)
Eosinophils Relative: 0 %
HCT: 36.8 % (ref 36.0–46.0)
Hemoglobin: 12.1 g/dL (ref 12.0–15.0)
Immature Granulocytes: 0 %
Lymphocytes Relative: 4 %
Lymphs Abs: 0.5 10*3/uL — ABNORMAL LOW (ref 0.7–4.0)
MCH: 31.8 pg (ref 26.0–34.0)
MCHC: 32.9 g/dL (ref 30.0–36.0)
MCV: 96.8 fL (ref 80.0–100.0)
Monocytes Absolute: 0.7 10*3/uL (ref 0.1–1.0)
Monocytes Relative: 6 %
Neutro Abs: 9.7 10*3/uL — ABNORMAL HIGH (ref 1.7–7.7)
Neutrophils Relative %: 90 %
Platelets: 188 10*3/uL (ref 150–400)
RBC: 3.8 MIL/uL — ABNORMAL LOW (ref 3.87–5.11)
RDW: 13.2 % (ref 11.5–15.5)
WBC: 10.9 10*3/uL — ABNORMAL HIGH (ref 4.0–10.5)
nRBC: 0 % (ref 0.0–0.2)

## 2021-05-26 LAB — LACTIC ACID, PLASMA
Lactic Acid, Venous: 3.4 mmol/L (ref 0.5–1.9)
Lactic Acid, Venous: 2.4 mmol/L (ref 0.5–1.9)

## 2021-05-26 LAB — COMPREHENSIVE METABOLIC PANEL
ALT: 32 U/L (ref 0–44)
AST: 35 U/L (ref 15–41)
Albumin: 3.9 g/dL (ref 3.5–5.0)
Alkaline Phosphatase: 51 U/L (ref 38–126)
Anion gap: 11 (ref 5–15)
BUN: 24 mg/dL — ABNORMAL HIGH (ref 8–23)
CO2: 19 mmol/L — ABNORMAL LOW (ref 22–32)
Calcium: 8.5 mg/dL — ABNORMAL LOW (ref 8.9–10.3)
Chloride: 108 mmol/L (ref 98–111)
Creatinine, Ser: 1.11 mg/dL — ABNORMAL HIGH (ref 0.44–1.00)
GFR, Estimated: 47 mL/min — ABNORMAL LOW (ref >60)
Glucose, Bld: 347 mg/dL — ABNORMAL HIGH (ref 70–99)
Potassium: 3.3 mmol/L — ABNORMAL LOW (ref 3.5–5.1)
Sodium: 138 mmol/L (ref 135–145)
Total Bilirubin: 1 mg/dL (ref 0.3–1.2)
Total Protein: 7.5 g/dL (ref 6.5–8.1)

## 2021-05-26 LAB — RESP PANEL BY RT-PCR (FLU A&B, COVID) ARPGX2
Influenza A by PCR: NEGATIVE
Influenza B by PCR: NEGATIVE
SARS Coronavirus 2 by RT PCR: POSITIVE — AB

## 2021-05-26 LAB — FIBRINOGEN: Fibrinogen: 621 mg/dL — ABNORMAL HIGH (ref 210–475)

## 2021-05-26 LAB — C-REACTIVE PROTEIN: CRP: 13.9 mg/dL — ABNORMAL HIGH (ref <1.0)

## 2021-05-26 LAB — PROCALCITONIN: Procalcitonin: 6.03 ng/mL

## 2021-05-26 LAB — BRAIN NATRIURETIC PEPTIDE: B Natriuretic Peptide: 1436.9 pg/mL — ABNORMAL HIGH (ref 0.0–100.0)

## 2021-05-26 LAB — GLUCOSE, CAPILLARY: Glucose-Capillary: 318 mg/dL — ABNORMAL HIGH (ref 70–99)

## 2021-05-26 LAB — D-DIMER, QUANTITATIVE: D-Dimer, Quant: 1.1 ug/mL-FEU — ABNORMAL HIGH (ref 0.00–0.50)

## 2021-05-26 LAB — FERRITIN: Ferritin: 197 ng/mL (ref 11–307)

## 2021-05-26 MED ORDER — SODIUM CHLORIDE 0.9 % IV SOLN
100.0000 mg | INTRAVENOUS | Status: AC
  Administered 2021-05-26 (×2): 100 mg via INTRAVENOUS
  Filled 2021-05-26 (×2): qty 20

## 2021-05-26 MED ORDER — LATANOPROST 0.005 % OP SOLN
1.0000 [drp] | Freq: Every day | OPHTHALMIC | Status: DC
  Administered 2021-05-26 – 2021-06-05 (×11): 1 [drp] via OPHTHALMIC
  Filled 2021-05-26: qty 2.5

## 2021-05-26 MED ORDER — METHYLPREDNISOLONE SODIUM SUCC 40 MG IJ SOLR
40.0000 mg | Freq: Every day | INTRAMUSCULAR | Status: AC
  Administered 2021-05-26 – 2021-05-28 (×3): 40 mg via INTRAVENOUS
  Filled 2021-05-26 (×3): qty 1

## 2021-05-26 MED ORDER — METOPROLOL TARTRATE 25 MG PO TABS
50.0000 mg | ORAL_TABLET | Freq: Two times a day (BID) | ORAL | Status: DC
  Administered 2021-05-26: 50 mg via ORAL
  Filled 2021-05-26 (×2): qty 2

## 2021-05-26 MED ORDER — LEVOFLOXACIN IN D5W 750 MG/150ML IV SOLN: 750.0000 mg | INTRAVENOUS | Status: DC

## 2021-05-26 MED ORDER — ACETAMINOPHEN 325 MG PO TABS
650.0000 mg | ORAL_TABLET | Freq: Four times a day (QID) | ORAL | Status: DC | PRN
  Administered 2021-05-31: 650 mg via ORAL

## 2021-05-26 MED ORDER — RAMIPRIL 2.5 MG PO CAPS
2.5000 mg | ORAL_CAPSULE | Freq: Every day | ORAL | Status: DC
  Administered 2021-05-26: 2.5 mg via ORAL
  Filled 2021-05-26 (×2): qty 1

## 2021-05-26 MED ORDER — GLIPIZIDE ER 2.5 MG PO TB24
2.5000 mg | ORAL_TABLET | Freq: Every day | ORAL | Status: DC
  Filled 2021-05-26: qty 1

## 2021-05-26 MED ORDER — MELATONIN 3 MG PO TABS
3.0000 mg | ORAL_TABLET | Freq: Every day | ORAL | Status: DC
  Administered 2021-05-26 – 2021-06-05 (×11): 3 mg via ORAL
  Filled 2021-05-26 (×11): qty 1

## 2021-05-26 MED ORDER — INSULIN ASPART 100 UNIT/ML IJ SOLN
0.0000 [IU] | INTRAMUSCULAR | Status: DC
  Administered 2021-05-26: 11 [IU] via SUBCUTANEOUS
  Administered 2021-05-27 (×2): 3 [IU] via SUBCUTANEOUS
  Administered 2021-05-27: 5 [IU] via SUBCUTANEOUS
  Administered 2021-05-27: 3 [IU] via SUBCUTANEOUS
  Administered 2021-05-27: 2 [IU] via SUBCUTANEOUS
  Administered 2021-05-28: 5 [IU] via SUBCUTANEOUS
  Administered 2021-05-28: 8 [IU] via SUBCUTANEOUS
  Administered 2021-05-29: 3 [IU] via SUBCUTANEOUS
  Administered 2021-05-29: 8 [IU] via SUBCUTANEOUS
  Administered 2021-05-29: 5 [IU] via SUBCUTANEOUS
  Administered 2021-05-29: 2 [IU] via SUBCUTANEOUS
  Administered 2021-05-29: 5 [IU] via SUBCUTANEOUS
  Administered 2021-05-30: 11 [IU] via SUBCUTANEOUS
  Administered 2021-05-30: 3 [IU] via SUBCUTANEOUS
  Administered 2021-05-30 (×2): 8 [IU] via SUBCUTANEOUS
  Administered 2021-05-30: 3 [IU] via SUBCUTANEOUS
  Administered 2021-05-31: 15 [IU] via SUBCUTANEOUS
  Administered 2021-05-31: 8 [IU] via SUBCUTANEOUS
  Administered 2021-05-31: 5 [IU] via SUBCUTANEOUS
  Administered 2021-06-01 – 2021-06-02 (×4): 3 [IU] via SUBCUTANEOUS
  Administered 2021-06-02: 18:00:00 11 [IU] via SUBCUTANEOUS
  Administered 2021-06-02: 05:00:00 3 [IU] via SUBCUTANEOUS

## 2021-05-26 MED ORDER — PREDNISONE 50 MG PO TABS
50.0000 mg | ORAL_TABLET | Freq: Every day | ORAL | Status: DC
  Administered 2021-05-29 – 2021-05-31 (×3): 50 mg via ORAL
  Filled 2021-05-26 (×3): qty 1

## 2021-05-26 MED ORDER — ONDANSETRON HCL 4 MG/2ML IJ SOLN: 4.0000 mg | Freq: Four times a day (QID) | INTRAMUSCULAR | Status: DC | PRN

## 2021-05-26 MED ORDER — SODIUM CHLORIDE 0.9 % IV SOLN
100.0000 mg | Freq: Every day | INTRAVENOUS | Status: AC
  Administered 2021-05-27 – 2021-05-30 (×4): 100 mg via INTRAVENOUS
  Filled 2021-05-26 (×4): qty 20

## 2021-05-26 MED ORDER — BRIMONIDINE TARTRATE 0.15 % OP SOLN
1.0000 [drp] | Freq: Two times a day (BID) | OPHTHALMIC | Status: DC
  Administered 2021-05-26 – 2021-06-06 (×22): 1 [drp] via OPHTHALMIC
  Filled 2021-05-26: qty 5

## 2021-05-26 MED ORDER — ZINC SULFATE 220 (50 ZN) MG PO CAPS
220.0000 mg | ORAL_CAPSULE | Freq: Every day | ORAL | Status: DC
  Administered 2021-05-26 – 2021-06-06 (×12): 220 mg via ORAL
  Filled 2021-05-26 (×12): qty 1

## 2021-05-26 MED ORDER — GUAIFENESIN-DM 100-10 MG/5ML PO SYRP
10.0000 mL | ORAL_SOLUTION | ORAL | Status: DC | PRN
  Administered 2021-05-26: 10 mL via ORAL
  Filled 2021-05-26: qty 10

## 2021-05-26 MED ORDER — APIXABAN 2.5 MG PO TABS
2.5000 mg | ORAL_TABLET | Freq: Two times a day (BID) | ORAL | Status: DC
  Administered 2021-05-26 – 2021-06-06 (×22): 2.5 mg via ORAL
  Filled 2021-05-26 (×23): qty 1

## 2021-05-26 MED ORDER — ALBUTEROL SULFATE HFA 108 (90 BASE) MCG/ACT IN AERS
2.0000 | INHALATION_SPRAY | Freq: Four times a day (QID) | RESPIRATORY_TRACT | Status: DC
  Administered 2021-05-26: 2 via RESPIRATORY_TRACT
  Filled 2021-05-26 (×2): qty 6.7

## 2021-05-26 MED ORDER — INSULIN DETEMIR 100 UNIT/ML ~~LOC~~ SOLN
0.1000 [IU]/kg | Freq: Two times a day (BID) | SUBCUTANEOUS | Status: DC
  Administered 2021-05-26 – 2021-06-01 (×12): 4 [IU] via SUBCUTANEOUS
  Filled 2021-05-26 (×14): qty 0.04

## 2021-05-26 MED ORDER — FUROSEMIDE 10 MG/ML IJ SOLN
20.0000 mg | Freq: Once | INTRAMUSCULAR | Status: AC
  Administered 2021-05-26: 20 mg via INTRAVENOUS
  Filled 2021-05-26: qty 4

## 2021-05-26 MED ORDER — HYDROCOD POLST-CPM POLST ER 10-8 MG/5ML PO SUER: 5.0000 mL | Freq: Two times a day (BID) | ORAL | Status: DC | PRN

## 2021-05-26 MED ORDER — PRAVASTATIN SODIUM 20 MG PO TABS
80.0000 mg | ORAL_TABLET | Freq: Every day | ORAL | Status: DC
  Administered 2021-05-26 – 2021-06-06 (×12): 80 mg via ORAL
  Filled 2021-05-26 (×13): qty 4

## 2021-05-26 MED ORDER — POTASSIUM CHLORIDE CRYS ER 20 MEQ PO TBCR
20.0000 meq | EXTENDED_RELEASE_TABLET | Freq: Once | ORAL | Status: AC
  Administered 2021-05-26: 20 meq via ORAL
  Filled 2021-05-26: qty 1

## 2021-05-26 MED ORDER — ONDANSETRON HCL 4 MG PO TABS: 4.0000 mg | ORAL_TABLET | Freq: Four times a day (QID) | ORAL | Status: DC | PRN

## 2021-05-26 MED ORDER — LINAGLIPTIN 5 MG PO TABS
5.0000 mg | ORAL_TABLET | Freq: Every day | ORAL | Status: DC
  Administered 2021-05-26 – 2021-06-06 (×12): 5 mg via ORAL
  Filled 2021-05-26 (×12): qty 1

## 2021-05-26 MED ORDER — ACETAMINOPHEN 650 MG RE SUPP: 650.0000 mg | Freq: Four times a day (QID) | RECTAL | Status: DC | PRN

## 2021-05-26 MED ORDER — ASCORBIC ACID 500 MG PO TABS
500.0000 mg | ORAL_TABLET | Freq: Every day | ORAL | Status: DC
  Administered 2021-05-26 – 2021-06-06 (×12): 500 mg via ORAL
  Filled 2021-05-26 (×12): qty 1

## 2021-05-26 MED ORDER — NETARSUDIL DIMESYLATE 0.02 % OP SOLN
1.0000 [drp] | Freq: Every day | OPHTHALMIC | Status: DC
Start: 2021-05-26 — End: 2021-06-06

## 2021-05-26 MED ORDER — LEVOFLOXACIN IN D5W 750 MG/150ML IV SOLN
750.0000 mg | Freq: Once | INTRAVENOUS | Status: AC
  Administered 2021-05-26: 750 mg via INTRAVENOUS
  Filled 2021-05-26: qty 150

## 2021-05-26 MED ORDER — LEVOFLOXACIN IN D5W 500 MG/100ML IV SOLN
500.0000 mg | INTRAVENOUS | Status: AC
  Administered 2021-05-28 – 2021-05-30 (×2): 500 mg via INTRAVENOUS
  Filled 2021-05-26: qty 100

## 2021-05-26 MED ORDER — MAGNESIUM SULFATE IN D5W 1-5 GM/100ML-% IV SOLN
1.0000 g | Freq: Once | INTRAVENOUS | Status: AC
  Administered 2021-05-27: 1 g via INTRAVENOUS
  Filled 2021-05-26 (×2): qty 100

## 2021-05-26 MED ORDER — FERROUS SULFATE 325 (65 FE) MG PO TABS
325.0000 mg | ORAL_TABLET | Freq: Every day | ORAL | Status: DC
  Administered 2021-05-27 – 2021-06-06 (×11): 325 mg via ORAL
  Filled 2021-05-26 (×11): qty 1

## 2021-05-26 MED ORDER — ALBUTEROL SULFATE HFA 108 (90 BASE) MCG/ACT IN AERS
2.0000 | INHALATION_SPRAY | Freq: Two times a day (BID) | RESPIRATORY_TRACT | Status: DC
  Administered 2021-05-27 – 2021-06-04 (×18): 2 via RESPIRATORY_TRACT
  Filled 2021-05-26: qty 6.7

## 2021-05-26 NOTE — ED Triage Notes (Signed)
Pt BIB EMS. Pt coming from home c/o SOB and weakness that started today. Pt recently went to visit husband in nursing  home who has covid. Pt has hx of afib and has pacemaker. EMS gave 579ml and 1000mg  Tylenol. Pt was stating at 90% RA, EMS placed pt on 2LNC and O2 increased to 97%. Temp 101.8  BP-190/110 CBG-325

## 2021-05-26 NOTE — ED Notes (Signed)
CRITICAL VALUE STICKER  CRITICAL VALUE:lactic acid 3.4  RECEIVER (on-site recipient of call):lab  DATE & TIME NOTIFIED: 05/26/21 1142  MESSENGER (representative from lab):lab  MD NOTIFIED: [place]  RESPONSE:

## 2021-05-26 NOTE — ED Provider Notes (Signed)
[facility] DEPT Provider Note   CSN: [id number] Arrival date & time: 05/26/21  1327     History Chief Complaint  Patient presents with   Shortness of Breath    Amy Wolf is a 87 y.o. female.  HPI Amy Wolf is a 87 yo female with a PMH of CHF w/pacemaker, atrial fibrillation, DM II, CKD stage 3, PVD, CAD, and HTN presenting to the emergency room after reporting SOB and weakness this morning. The patient has recently gone to visit her husband in a nursing home, who has [place]. Upon EMS arrival she was found with O2 sats at 90% on RA and Temp of 101.76F. Patient was given fluids and tylenol and placed on nasal cannula at 2L, sats increased to 97%. Patient is alert and oriented x4, however denies any SOB, confusion, chest pain, weakness, fevers, dysuria. Patient says she was sent here from her nursing home "for a check up".    Per RN: "Pt BIB EMS. Pt coming from home c/o SOB and weakness that started today. Pt recently went to visit husband in nursing  home who has covid. Pt has hx of afib and has pacemaker. EMS gave 585ml and 1000mg  Tylenol. Pt was stating at 90% RA, EMS placed pt on 2LNC and O2 increased to 97%. Temp 101.8"   Past Medical History:  Diagnosis Date   AICD (automatic cardioverter/defibrillator) present    Arrhythmia    Arthritis    Atherosclerosis of right carotid artery 04/01/2019   80% blockage. Last ultrasound in 12/2018. Saw vascular surgery in June 2020. Put on DPT in June (asa added by surgeon)   Benign essential HTN 04/01/2019   CAD (coronary artery disease) 04/01/2019   Cardiac arrhythmia due to congenital heart disease    CHF (congestive heart failure) ([place]) 04/01/2019   Chronic kidney disease (CKD), stage III (moderate) ([place]) 04/04/2019   Diabetes ([place])    Glaucoma    Heart murmur    Hx of CABG 04/01/2019   Several years ago per family    Hyperlipemia    Migraines    Pacemaker    Paroxysmal A-fib ([place]) 04/01/2019   warafin in the  remote past, but changed to plavix. CHADS2 score of 8%. High fall risk...    PVD (peripheral vascular disease) ([place]) 04/01/2019   Type 2 diabetes mellitus with diabetic chronic kidney disease ([place]) 04/01/2019   Urine incontinence    Valvular heart disease 04/01/2019   Mitral and tricuspid annuloplasty     Patient Active Problem List   Diagnosis Date Noted   Persistent atrial fibrillation ([place]) 01/23/2020   Secondary hypercoagulable state ([place]) 01/23/2020   Chronic kidney disease (CKD), stage III (moderate) ([place]) 04/04/2019   Benign essential HTN 04/01/2019   Type 2 diabetes mellitus with diabetic chronic kidney disease ([place]) 04/01/2019   CAD (coronary artery disease) 04/01/2019   Hx of CABG 04/01/2019   Paroxysmal A-fib ([place]) 04/01/2019   Atherosclerosis of right carotid artery 04/01/2019   PVD (peripheral vascular disease) ([place]) 04/01/2019   AICD (automatic cardioverter/defibrillator) present 04/01/2019   Valvular heart disease 04/01/2019   CHF (congestive heart failure) ([place]) 04/01/2019   Glaucoma 04/01/2019    Past Surgical History:  Procedure Laterality Date   ABDOMINAL HYSTERECTOMY     BACK SURGERY  1980   CARDIAC DEFIBRILLATOR PLACEMENT     CORONARY ARTERY BYPASS GRAFT     HIP FRACTURE SURGERY  2015   PPM GENERATOR CHANGEOUT       OB History  No obstetric history on file.     Family History  Problem Relation Age of Onset   Cancer Father    Cancer Sister    Cancer Daughter     Social History   Tobacco Use   Smoking status: Never   Smokeless tobacco: Never  Vaping Use   Vaping Use: Never used  Substance Use Topics   Alcohol use: Not Currently   Drug use: Never    Home Medications Prior to Admission medications   Medication Sig Start Date End Date Taking? Authorizing Provider  acetaminophen (TYLENOL) 500 MG tablet Take 500 mg by mouth in the morning and at bedtime.    Yes [provider]  bimatoprost (LUMIGAN) 0.01 % SOLN Place 1 drop into  both eyes at bedtime.   Yes [provider]  brimonidine (ALPHAGAN) 0.15 % ophthalmic solution Place 1 drop into the right eye 2 (two) times daily.   Yes [provider]  ELIQUIS 2.5 MG TABS tablet TAKE 1 TABLET BY MOUTH TWICE A DAY 10/17/20  Yes Camnitz, Will Hassell Done, MD  ferrous sulfate 325 (65 FE) MG tablet Take 325 mg by mouth daily with breakfast.    Yes [provider]  glipiZIDE (GLUCOTROL XL) 2.5 MG 24 hr tablet TAKE 1 TABLET BY MOUTH EVERY DAY WITH BREAKFAST Patient taking differently: 2.5 mg daily with breakfast. 04/18/21  Yes Allwardt, [name] M, PA-C  Melatonin 3 MG CAPS Take 3 mg by mouth at bedtime.    Yes [provider]  metoprolol tartrate (LOPRESSOR) 50 MG tablet Take 1 tablet (50 mg total) by mouth 2 (two) times daily. 01/01/21  Yes Loel Dubonnet, NP  Netarsudil Dimesylate (RHOPRESSA) 0.02 % SOLN Place 1 drop into both eyes at bedtime.   Yes [provider]  pravastatin (PRAVACHOL) 80 MG tablet TAKE 1 TABLET BY MOUTH EVERY DAY Patient taking differently: Take 80 mg by mouth daily. 03/27/21  Yes Allwardt, [name] M, PA-C  ramipril (ALTACE) 2.5 MG capsule Take 1 capsule (2.5 mg total) by mouth daily. 06/11/20  Yes Orma Flaming, MD    Allergies    Iodine, Erythromycin, Penicillins, and Sulfa antibiotics  Review of Systems   Review of Systems  Constitutional:        Per HPI, otherwise negative  HENT:         Per HPI, otherwise negative  Respiratory:         Per HPI, otherwise negative  Cardiovascular:        Per HPI, otherwise negative  Gastrointestinal:  Negative for vomiting.  Endocrine:       Negative aside from HPI  Genitourinary:        Neg aside from HPI   Musculoskeletal:        Per HPI, otherwise negative  Skin: Negative.   Neurological:  Negative for syncope.   Physical Exam Updated Vital Signs BP (!) 165/78   Pulse 81   Temp 97.6 F (36.4 C)   Resp (!) 23   Ht 5\' 5"  (1.651 m)   Wt 44 kg   SpO2 98%    BMI 16.14 kg/m   Physical Exam Vitals and nursing note reviewed.  Constitutional:      General: She is not in acute distress.    Appearance: She is well-developed.  HENT:     Head: Normocephalic and atraumatic.  Eyes:     Conjunctiva/sclera: Conjunctivae normal.  Cardiovascular:     Rate and Rhythm: Normal rate. Rhythm irregular.  Pulmonary:  Effort: Tachypnea present.     Breath sounds: Decreased breath sounds present.  Chest:    Abdominal:     General: There is no distension.  Skin:    General: Skin is warm and dry.  Neurological:     Mental Status: She is alert and oriented to person, place, and time.     Cranial Nerves: No cranial nerve deficit.    ED Results / Procedures / Treatments   Labs (all labs ordered are listed, but only abnormal results are displayed) Labs Reviewed  RESP PANEL BY RT-PCR (FLU A&B, COVID) ARPGX2 - Abnormal; Notable for the following components:      Result Value   SARS Coronavirus 2 by RT PCR POSITIVE (*)    All other components within normal limits  COMPREHENSIVE METABOLIC PANEL - Abnormal; Notable for the following components:   Potassium 3.3 (*)    CO2 19 (*)    Glucose, Bld 347 (*)    BUN 24 (*)    Creatinine, Ser 1.11 (*)    Calcium 8.5 (*)    GFR, Estimated 47 (*)    All other components within normal limits  CBC WITH DIFFERENTIAL/PLATELET - Abnormal; Notable for the following components:   WBC 10.9 (*)    RBC 3.80 (*)    Neutro Abs 9.7 (*)    Lymphs Abs 0.5 (*)    All other components within normal limits  BRAIN NATRIURETIC PEPTIDE - Abnormal; Notable for the following components:   B Natriuretic Peptide 1,436.9 (*)    All other components within normal limits  LACTIC ACID, PLASMA - Abnormal; Notable for the following components:   Lactic Acid, Venous 3.4 (*)    All other components within normal limits  LACTIC ACID, PLASMA    EKG EKG Interpretation  Date/Time:  Sunday May 26 2021 13:45:51 EST Ventricular  Rate:  75 PR Interval:    QRS Duration: 172 QT Interval:  474 QTC Calculation: 530 R Axis:   -82 Text Interpretation: Atrial fibrillation Left bundle branch block Abnormal ECG Confirmed by Carmin Muskrat [id number]) on 05/26/2021 2:23:59 PM  Radiology DG Chest Port 1 View  Result Date: 05/26/2021 CLINICAL DATA:  Shortness of breath EXAM: PORTABLE CHEST 1 VIEW COMPARISON:  None. FINDINGS: Transverse diameter of heart is increased. There is evidence of previous coronary bypass surgery. Pacemaker/defibrillator battery is seen in the left infraclavicular region. Central pulmonary vessels are prominent. Increased interstitial and alveolar markings are seen in parahilar regions and lower lung fields, more so on the right side. There is blunting of left lateral CP angle. There is no pneumothorax. IMPRESSION: Cardiomegaly. Central pulmonary vessels are prominent suggesting CHF. Increased interstitial and alveolar markings are seen in the parahilar regions and lower lung fields, more so on the right side suggesting asymmetric pulmonary edema or bilateral pneumonia. Electronically Signed   By: Elmer Picker M.D.   On: 05/26/2021 14:17    Procedures Procedures   Medications Ordered in ED Medications - No data to display  ED Course  I have reviewed the triage vital signs and the nursing notes.  Pertinent labs & imaging results that were available during my care of the patient were reviewed by me and considered in my medical decision making (see chart for details).  Pulse ox 98% 2 L nasal cannula abnormal Cardiac 80s A. fib abnormal  Update: Patient found to have mild lactic acidosis, 3.4.  She is not hypotensive, however.  Update:, Labs notable for elevated BNP, and on reviewing her  his x-ray, there is concern for pulmonary congestion.  She has a history of heart failure.  3:02 PM Patient found to be COVID-positive.  Suspicion for COVID contributing to her hypoxia, though likely complicated  by heart failure exacerbation.  Patient will receive diuretics, remdesivir, require admission for further monitoring, management. MDM Rules/Calculators/A&P MDM Number of Diagnoses or Management Options Acute on chronic systolic congestive heart failure ([place]): new, needed workup COVID: new, needed workup   Amount and/or Complexity of Data Reviewed Clinical lab tests: reviewed and ordered Tests in the radiology section of CPT: ordered and reviewed Tests in the medicine section of CPT: ordered and reviewed Decide to obtain previous medical records or to obtain history from someone other than the patient: yes Obtain history from someone other than the patient: yes Review and summarize past medical records: yes Discuss the patient with other providers: yes Independent visualization of images, tracings, or specimens: yes  Risk of Complications, Morbidity, and/or Mortality Presenting problems: high Diagnostic procedures: high Management options: high  Critical Care Total time providing critical care: 30-74 minutes (35)  Patient Progress Patient progress: stable   Final Clinical Impression(s) / ED Diagnoses Final diagnoses:  COVID  Acute on chronic systolic congestive heart failure ([place])       Carmin Muskrat, MD 05/26/21 1505

## 2021-05-26 NOTE — Progress Notes (Signed)
PHARMACY NOTE:  ANTIMICROBIAL RENAL DOSAGE ADJUSTMENT  Current antimicrobial regimen includes a mismatch between antimicrobial dosage and estimated renal function.  As per policy approved by the Pharmacy & Therapeutics and Medical Executive Committees, the antimicrobial dosage will be adjusted accordingly.  Current antimicrobial dosage:  Levaquin 750mg  IV q24h x 5 days  Indication: CAP  Renal Function:  Estimated Creatinine Clearance: 22.7 mL/min (A) (by C-G formula based on SCr of 1.11 mg/dL (H)). []      On intermittent HD, scheduled: []      On CRRT    Antimicrobial dosage has been changed to:  750mg  IV x 1, then 500mg  q48h x 2 doses to complete 5 days  Additional comments:   Thank you for allowing pharmacy to be a part of this patient's care.  Peggyann Juba, PharmD, BCPS Pharmacy: [id number] 05/26/2021 8:32 PM

## 2021-05-26 NOTE — H&P (Signed)
History and Physical    Amy Wolf RDE:[id number] DOB: Aug 24, 1927 DOA: 05/26/2021  PCP: Fredirick Lathe, PA-C  Patient coming from: Home.  I have personally briefly reviewed patient's old medical records in [place]  Chief Complaint: Shortness of breath.  HPI: Amy Wolf is a 87 y.o. female with medical history significant of permanent atrial fibrillation, pacemaker placement, coronary artery disease, hypertension, CAD/CABG, chronic systolic heart failure, stage IIIa CKD, type II DM, glaucoma, osteoarthritis, hyperlipidemia, migraine headaches, urinary incontinence who is coming with generalized weakness and dyspnea since earlier today.  She was recently exposed stool her husband who resides in the nursing home and has been convalescing with COVID.  She subsequently called EMS who noticed that her O2 sats was 90% on room air and she had a temperature of 101.8 F.  EMS gave her supplemental oxygen and acetaminophen.  Her O2 sat subsequently improved to 97%.  She denied rhinorrhea, sore throat, but has decreased appetite and malaise.  No wheezing or hemoptysis.  No chest pain, palpitations, diaphoresis, PND, orthopnea or pitting edema of the lower extremities.  Denied abdominal pain, nausea, emesis, diarrhea, constipation, melena or hematochezia.  No dysuria, frequency or hematuria.  She uses depends.  No polyuria, polydipsia, polyphagia or blurred vision.  ED Course: Initial vital signs were temperature 97.6 F, pulse 81, respiration 23, BP  165/78 mmHg and O2 sat 87% on room air.  The patient received remdesivir and 20 mg of furosemide IVP.  Lab work: CBC is her white count of 10.9, hemoglobin 12.1 g/dL and platelets 188.   D-dimer was 1.1 and fibrinogen 621 mg/dL. Lactic acid was 3.4 then 2.4 mmol/L.  CBC is her white count 10.9 with 90% neutrophils, hemoglobin 12.1 g/dL platelets 188.  BNP was 1436.9 pg/mL.  Procalcitonin was 6.03 ng/mL.  CMP showed a potassium of 3.3 and CO2 of 19  mmol/L.    Calcium 8.5,Glucose 347, BUN 24 and creatinine 1.11 mg/dL. LFTs were normal.  Imaging: One-view portable chest radiograph showed cardiomegaly with central pulmonary vessels prominence suggesting CHF.  There were increased interstitial and alveolar markings in the perihilar regions and lower lung fields, more so on the right side suggesting asymmetric pulmonary edema or bilateral pneumonia.  Please see images and full radiology report for further details.  Review of Systems: As per HPI otherwise all other systems reviewed and are negative.  Past Medical History:  Diagnosis Date   AICD (automatic cardioverter/defibrillator) present    Arrhythmia    Arthritis    Atherosclerosis of right carotid artery 04/01/2019   80% blockage. Last ultrasound in 12/2018. Saw vascular surgery in June 2020. Put on DPT in June (asa added by surgeon)   Benign essential HTN 04/01/2019   CAD (coronary artery disease) 04/01/2019   Cardiac arrhythmia due to congenital heart disease    CHF (congestive heart failure) ([place]) 04/01/2019   Chronic kidney disease (CKD), stage III (moderate) ([place]) 04/04/2019   Diabetes ([place])    Glaucoma    Heart murmur    Hx of CABG 04/01/2019   Several years ago per family    Hyperlipemia    Migraines    Pacemaker    Paroxysmal A-fib ([place]) 04/01/2019   warafin in the remote past, but changed to plavix. CHADS2 score of 8%. High fall risk...    PVD (peripheral vascular disease) ([place]) 04/01/2019   Type 2 diabetes mellitus with diabetic chronic kidney disease ([place]) 04/01/2019   Urine incontinence    Valvular heart disease 04/01/2019  Mitral and tricuspid annuloplasty    Past Surgical History:  Procedure Laterality Date   ABDOMINAL HYSTERECTOMY     BACK SURGERY  1980   CARDIAC DEFIBRILLATOR PLACEMENT     CORONARY ARTERY BYPASS GRAFT     HIP FRACTURE SURGERY  2015   PPM GENERATOR CHANGEOUT      Social History  reports that she has never smoked. She has never used smokeless  tobacco. She reports that she does not currently use alcohol. She reports that she does not use drugs.  Allergies  Allergen Reactions   Iodine Other (See Comments)   Erythromycin Rash   Penicillins Rash   Sulfa Antibiotics Rash   Family History  Problem Relation Age of Onset   Cancer Father    Cancer Sister    Cancer Daughter    Prior to Admission medications   Medication Sig Start Date End Date Taking? Authorizing Provider  acetaminophen (TYLENOL) 500 MG tablet Take 500 mg by mouth in the morning and at bedtime.    Yes [provider]  bimatoprost (LUMIGAN) 0.01 % SOLN Place 1 drop into both eyes at bedtime.   Yes [provider]  brimonidine (ALPHAGAN) 0.15 % ophthalmic solution Place 1 drop into the right eye 2 (two) times daily.   Yes [provider]  ELIQUIS 2.5 MG TABS tablet TAKE 1 TABLET BY MOUTH TWICE A DAY 10/17/20  Yes Camnitz, Will Hassell Done, MD  ferrous sulfate 325 (65 FE) MG tablet Take 325 mg by mouth daily with breakfast.    Yes [provider]  glipiZIDE (GLUCOTROL XL) 2.5 MG 24 hr tablet TAKE 1 TABLET BY MOUTH EVERY DAY WITH BREAKFAST Patient taking differently: 2.5 mg daily with breakfast. 04/18/21  Yes Allwardt, [name] M, PA-C  Melatonin 3 MG CAPS Take 3 mg by mouth at bedtime.    Yes [provider]  metoprolol tartrate (LOPRESSOR) 50 MG tablet Take 1 tablet (50 mg total) by mouth 2 (two) times daily. 01/01/21  Yes Loel Dubonnet, NP  Netarsudil Dimesylate (RHOPRESSA) 0.02 % SOLN Place 1 drop into both eyes at bedtime.   Yes [provider]  pravastatin (PRAVACHOL) 80 MG tablet TAKE 1 TABLET BY MOUTH EVERY DAY Patient taking differently: Take 80 mg by mouth daily. 03/27/21  Yes Allwardt, [name] M, PA-C  ramipril (ALTACE) 2.5 MG capsule Take 1 capsule (2.5 mg total) by mouth daily. 06/11/20  Yes Orma Flaming, MD   Physical Exam: Vitals:   05/26/21 1359 05/26/21 1401 05/26/21 1432 05/26/21 1718  BP: (!) 165/78    (!) 188/105  Pulse: 81   83  Resp: (!) 23     Temp: 97.6 F (36.4 C)   97.6 F (36.4 C)  TempSrc:    Oral  SpO2: 98%   99%  Weight:  44 kg 44 kg 42.7 kg  Height:   5\' 5"  (1.651 m) 5\' 7"  (1.702 m)   Constitutional: NAD, calm, comfortable Eyes: PERRL, lids and conjunctivae normal.  Mildly injected sclera. ENMT: Nasal cannula in place.  Mucous membranes are moist. Posterior pharynx clear of any exudate or lesions. Neck: normal, supple, no masses, no thyromegaly Respiratory: Decreased breath sounds in bases with scattered crackles bilaterally, no wheezing, no crackles. Normal respiratory effort. No accessory muscle use.  Cardiovascular: S1 and S2, irregularly irregular, no murmurs / rubs / gallops. No lower extremities pitting edema. 2+ pedal pulses. No carotid bruits.  Abdomen: No distention.  Bowel sounds positive.  Soft, no tenderness, no masses  palpated. No hepatosplenomegaly. Musculoskeletal: Mild generalized weakness.  No clubbing / cyanosis. Good ROM, no contractures. Normal muscle tone.  Skin: no acute rashes, lesions, ulcers on very limited mental examination. Neurologic: CN 2-12 grossly intact. Sensation intact, DTR normal. Strength 5/5 in all 4.  Psychiatric: Normal judgment and insight. Alert and oriented x 3. Normal mood.   Labs on Admission: I have personally reviewed following labs and imaging studies  CBC: Recent Labs  Lab 05/26/21 1400  WBC 10.9*  NEUTROABS 9.7*  HGB 12.1  HCT 36.8  MCV 96.8  PLT 323    Basic Metabolic Panel: Recent Labs  Lab 05/26/21 1400  NA 138  K 3.3*  CL 108  CO2 19*  GLUCOSE 347*  BUN 24*  CREATININE 1.11*  CALCIUM 8.5*    GFR: Estimated Creatinine Clearance: 22.7 mL/min (A) (by C-G formula based on SCr of 1.11 mg/dL (H)).  Liver Function Tests: Recent Labs  Lab 05/26/21 1400  AST 35  ALT 32  ALKPHOS 51  BILITOT 1.0  PROT 7.5  ALBUMIN 3.9    Urine analysis: No results found for: COLORURINE, APPEARANCEUR, LABSPEC,  PHURINE, GLUCOSEU, HGBUR, BILIRUBINUR, KETONESUR, PROTEINUR, UROBILINOGEN, NITRITE, LEUKOCYTESUR  Radiological Exams on Admission: DG Chest Port 1 View  Result Date: 05/26/2021 CLINICAL DATA:  Shortness of breath EXAM: PORTABLE CHEST 1 VIEW COMPARISON:  None. FINDINGS: Transverse diameter of heart is increased. There is evidence of previous coronary bypass surgery. Pacemaker/defibrillator battery is seen in the left infraclavicular region. Central pulmonary vessels are prominent. Increased interstitial and alveolar markings are seen in parahilar regions and lower lung fields, more so on the right side. There is blunting of left lateral CP angle. There is no pneumothorax. IMPRESSION: Cardiomegaly. Central pulmonary vessels are prominent suggesting CHF. Increased interstitial and alveolar markings are seen in the parahilar regions and lower lung fields, more so on the right side suggesting asymmetric pulmonary edema or bilateral pneumonia. Electronically Signed   By: Elmer Picker M.D.   On: 05/26/2021 14:17    04/12/2019 echocardiogram IMPRESSIONS:   1. Left ventricular ejection fraction, by visual estimation, is 30%. The  left ventricle has normal function. Normal left ventricular size. There is  mildly increased left ventricular hypertrophy. Septal-lateral dyssynchrony  with diffuse hypokinesis.   2. Left ventricular diastolic Doppler parameters are indeterminate  pattern of LV diastolic filling.   3. S/p tricuspid valve repair. No more than moderate tricuspid  regurgitation visually though thick CW doppler jet raises concern for more  significant tricuspid regurgitation.   4. S/p mitral valve repair. No more than moderate mitral regurgitation  visually, though thick CW doppler jet raises concern for more significant  mitral regurgitation. Mean gradient 3 mmHg across the mitral valve, no  evidence for significant stenosis.   5. The aortic valve is tricuspid Aortic valve regurgitation  is trivial by  color flow Doppler. Mild aortic valve sclerosis without stenosis.   6. Global right ventricle has mildly reduced systolic function.The right  ventricular size is mildly enlarged. No increase in right ventricular wall  thickness.   7. A pacer wire is visualized in the RV.   8. The tricuspid valve is normal in structure. Tricuspid valve  regurgitation is mild.   9. Right atrial size was moderately dilated.  10. Left atrial size was severely dilated.  11. The inferior vena cava is dilated in size with >50% respiratory  variability, suggesting right atrial pressure of 8 mmHg.  12. The tricuspid regurgitant velocity is 3.35 m/s, and  with an assumed  right atrial pressure of 8 mmHg, the estimated right ventricular systolic  pressure is moderately elevated at 52.9 mmHg.   EKG: Independently reviewed.  Vent. rate 75 BPM PR interval * ms QRS duration 172 ms QT/QTcB 474/530 ms P-R-T axes * -82 239 Atrial fibrillation Left bundle branch block  Assessment/Plan Principal Problem:   Acute respiratory failure with hypoxia (HCC) In the setting of IV   Acute respiratory disease due to COVID-19 virus Observation/telemetry. Continue supplemental oxygen. Flutter valve and incentive spirometry. Inhaled beta agonist as needed. Remdesivir per pharmacy. Methylprednisolone followed by prednisone taper. Added bacterial coverage due to elevated procalcitonin level. Follow-up CBC, CMP and inflammatory markers.  Active Problems:   Hypokalemia Replaced. Supplemented with mag sulfate. Follow-up potassium level in AM.    Chronic systolic CHF (congestive heart failure) ([place]) Does not look volume overloaded. Received furosemide in ED. Follow-up renal function in a.m. Continue beta-blocker and ACE inhibitor. Diuretics as needed.    Benign essential HTN Continue metoprolol. Continue ramipril 2.5 mg p.o. daily. Follow-up BP, HR, renal function electrolytes.    Type 2 diabetes  mellitus with diabetic chronic kidney disease (HCC) Carbohydrate modified diet. Tradjenta, Lantus and RI SS q 4 hr. Check hemoglobin A1c.    CAD (coronary artery disease) On apixaban, metoprolol and pravastatin.    Glaucoma Continue Lumigan and Rhopressa drops. Follow-up with ophthalmology as scheduled.    Persistent atrial fibrillation (HCC) CHA?DS?-VASc Score of at least 7. Continue apixaban 2.5 mg p.o. twice daily. Continue metoprolol 50 mg p.o. twice daily.    DVT prophylaxis: On Eliquis. Code Status:   Full code. Family Communication:   Disposition Plan:   Patient is from:  Home.  Anticipated DC to:  Home.  Anticipated DC date:  05/28/2021 or 05/29/2021.  Anticipated DC barriers: Clinical status.  Consults called:   Admission status:  Observation/telemetry.    Severity of Illness:High severity after presenting with acute respiratory failure in the setting of acute COVID-19 respiratory disease with superimposed bacterial infection and possibly acute on chronic systolic heart failure.  The patient will remain in the hospital for at least 48 hours for further treatment.  Reubin Milan MD [facility]  How to contact the [facility] Attending or Consulting provider [place] or covering provider during after hours [place], for this patient?   Check the care team in [facility] and look for a) attending/consulting [facility] provider listed and b) the [facility] team listed Log into [email or web address] and use [place] universal password to access. If you do not have the password, please contact the hospital operator. Locate the [facility] provider you are looking for under [facility] and page to a number that you can be directly reached. If you still have difficulty reaching the provider, please page the [facility] (Director on Call) for the Hospitalists listed on amion for assistance.  05/26/2021, 6:56 PM   This document was prepared using Dragon voice recognition software and may contain some  unintended transcription errors

## 2021-05-27 ENCOUNTER — Observation Stay (HOSPITAL_COMMUNITY): Payer: PPO

## 2021-05-27 DIAGNOSIS — I255 Ischemic cardiomyopathy: Secondary | ICD-10-CM | POA: Diagnosis present

## 2021-05-27 DIAGNOSIS — I739 Peripheral vascular disease, unspecified: Secondary | ICD-10-CM | POA: Diagnosis not present

## 2021-05-27 DIAGNOSIS — Z681 Body mass index (BMI) 19 or less, adult: Secondary | ICD-10-CM | POA: Diagnosis not present

## 2021-05-27 DIAGNOSIS — Z7189 Other specified counseling: Secondary | ICD-10-CM | POA: Diagnosis not present

## 2021-05-27 DIAGNOSIS — E785 Hyperlipidemia, unspecified: Secondary | ICD-10-CM | POA: Diagnosis not present

## 2021-05-27 DIAGNOSIS — J9601 Acute respiratory failure with hypoxia: Secondary | ICD-10-CM | POA: Diagnosis not present

## 2021-05-27 DIAGNOSIS — I5023 Acute on chronic systolic (congestive) heart failure: Secondary | ICD-10-CM | POA: Diagnosis not present

## 2021-05-27 DIAGNOSIS — R1312 Dysphagia, oropharyngeal phase: Secondary | ICD-10-CM | POA: Diagnosis not present

## 2021-05-27 DIAGNOSIS — E1151 Type 2 diabetes mellitus with diabetic peripheral angiopathy without gangrene: Secondary | ICD-10-CM | POA: Diagnosis present

## 2021-05-27 DIAGNOSIS — E1122 Type 2 diabetes mellitus with diabetic chronic kidney disease: Secondary | ICD-10-CM | POA: Diagnosis not present

## 2021-05-27 DIAGNOSIS — Z951 Presence of aortocoronary bypass graft: Secondary | ICD-10-CM | POA: Diagnosis not present

## 2021-05-27 DIAGNOSIS — Z7901 Long term (current) use of anticoagulants: Secondary | ICD-10-CM | POA: Diagnosis not present

## 2021-05-27 DIAGNOSIS — I5021 Acute systolic (congestive) heart failure: Secondary | ICD-10-CM | POA: Diagnosis not present

## 2021-05-27 DIAGNOSIS — I2510 Atherosclerotic heart disease of native coronary artery without angina pectoris: Secondary | ICD-10-CM

## 2021-05-27 DIAGNOSIS — I358 Other nonrheumatic aortic valve disorders: Secondary | ICD-10-CM | POA: Diagnosis present

## 2021-05-27 DIAGNOSIS — U071 COVID-19: Secondary | ICD-10-CM | POA: Diagnosis not present

## 2021-05-27 DIAGNOSIS — Z794 Long term (current) use of insulin: Secondary | ICD-10-CM | POA: Diagnosis not present

## 2021-05-27 DIAGNOSIS — R32 Unspecified urinary incontinence: Secondary | ICD-10-CM | POA: Diagnosis not present

## 2021-05-27 DIAGNOSIS — E1165 Type 2 diabetes mellitus with hyperglycemia: Secondary | ICD-10-CM | POA: Diagnosis present

## 2021-05-27 DIAGNOSIS — Z8616 Personal history of COVID-19: Secondary | ICD-10-CM | POA: Diagnosis not present

## 2021-05-27 DIAGNOSIS — E876 Hypokalemia: Secondary | ICD-10-CM

## 2021-05-27 DIAGNOSIS — D631 Anemia in chronic kidney disease: Secondary | ICD-10-CM | POA: Diagnosis not present

## 2021-05-27 DIAGNOSIS — Z743 Need for continuous supervision: Secondary | ICD-10-CM | POA: Diagnosis not present

## 2021-05-27 DIAGNOSIS — R41841 Cognitive communication deficit: Secondary | ICD-10-CM | POA: Diagnosis not present

## 2021-05-27 DIAGNOSIS — E43 Unspecified severe protein-calorie malnutrition: Secondary | ICD-10-CM | POA: Diagnosis not present

## 2021-05-27 DIAGNOSIS — I4819 Other persistent atrial fibrillation: Secondary | ICD-10-CM | POA: Diagnosis not present

## 2021-05-27 DIAGNOSIS — I351 Nonrheumatic aortic (valve) insufficiency: Secondary | ICD-10-CM | POA: Diagnosis present

## 2021-05-27 DIAGNOSIS — Z95810 Presence of automatic (implantable) cardiac defibrillator: Secondary | ICD-10-CM | POA: Diagnosis not present

## 2021-05-27 DIAGNOSIS — R0602 Shortness of breath: Secondary | ICD-10-CM | POA: Diagnosis present

## 2021-05-27 DIAGNOSIS — M6281 Muscle weakness (generalized): Secondary | ICD-10-CM | POA: Diagnosis not present

## 2021-05-27 DIAGNOSIS — I130 Hypertensive heart and chronic kidney disease with heart failure and stage 1 through stage 4 chronic kidney disease, or unspecified chronic kidney disease: Secondary | ICD-10-CM | POA: Diagnosis not present

## 2021-05-27 DIAGNOSIS — H409 Unspecified glaucoma: Secondary | ICD-10-CM | POA: Diagnosis not present

## 2021-05-27 DIAGNOSIS — I4821 Permanent atrial fibrillation: Secondary | ICD-10-CM | POA: Diagnosis present

## 2021-05-27 DIAGNOSIS — D638 Anemia in other chronic diseases classified elsewhere: Secondary | ICD-10-CM | POA: Diagnosis present

## 2021-05-27 DIAGNOSIS — R627 Adult failure to thrive: Secondary | ICD-10-CM | POA: Diagnosis present

## 2021-05-27 DIAGNOSIS — N1831 Chronic kidney disease, stage 3a: Secondary | ICD-10-CM | POA: Diagnosis not present

## 2021-05-27 DIAGNOSIS — Z79899 Other long term (current) drug therapy: Secondary | ICD-10-CM | POA: Diagnosis not present

## 2021-05-27 DIAGNOSIS — Z950 Presence of cardiac pacemaker: Secondary | ICD-10-CM | POA: Diagnosis not present

## 2021-05-27 DIAGNOSIS — I5032 Chronic diastolic (congestive) heart failure: Secondary | ICD-10-CM | POA: Diagnosis not present

## 2021-05-27 DIAGNOSIS — M1990 Unspecified osteoarthritis, unspecified site: Secondary | ICD-10-CM | POA: Diagnosis not present

## 2021-05-27 DIAGNOSIS — R410 Disorientation, unspecified: Secondary | ICD-10-CM | POA: Diagnosis not present

## 2021-05-27 DIAGNOSIS — R2681 Unsteadiness on feet: Secondary | ICD-10-CM | POA: Diagnosis not present

## 2021-05-27 LAB — CBC WITH DIFFERENTIAL/PLATELET
Abs Immature Granulocytes: 0 10*3/uL (ref 0.00–0.07)
Band Neutrophils: 9 %
Basophils Absolute: 0 10*3/uL (ref 0.0–0.1)
Basophils Relative: 0 %
Eosinophils Absolute: 0 10*3/uL (ref 0.0–0.5)
Eosinophils Relative: 0 %
HCT: 35 % — ABNORMAL LOW (ref 36.0–46.0)
Hemoglobin: 11.7 g/dL — ABNORMAL LOW (ref 12.0–15.0)
Lymphocytes Relative: 7 %
Lymphs Abs: 0.9 10*3/uL (ref 0.7–4.0)
MCH: 32.1 pg (ref 26.0–34.0)
MCHC: 33.4 g/dL (ref 30.0–36.0)
MCV: 95.9 fL (ref 80.0–100.0)
Monocytes Absolute: 0 10*3/uL — ABNORMAL LOW (ref 0.1–1.0)
Monocytes Relative: 0 %
Neutro Abs: 11.5 10*3/uL — ABNORMAL HIGH (ref 1.7–7.7)
Neutrophils Relative %: 84 %
Platelets: 175 10*3/uL (ref 150–400)
RBC: 3.65 MIL/uL — ABNORMAL LOW (ref 3.87–5.11)
RDW: 13.2 % (ref 11.5–15.5)
WBC: 12.4 10*3/uL — ABNORMAL HIGH (ref 4.0–10.5)
nRBC: 0 % (ref 0.0–0.2)

## 2021-05-27 LAB — COMPREHENSIVE METABOLIC PANEL
ALT: 23 U/L (ref 0–44)
AST: 26 U/L (ref 15–41)
Albumin: 3.3 g/dL — ABNORMAL LOW (ref 3.5–5.0)
Alkaline Phosphatase: 42 U/L (ref 38–126)
Anion gap: 7 (ref 5–15)
BUN: 29 mg/dL — ABNORMAL HIGH (ref 8–23)
CO2: 24 mmol/L (ref 22–32)
Calcium: 8.6 mg/dL — ABNORMAL LOW (ref 8.9–10.3)
Chloride: 106 mmol/L (ref 98–111)
Creatinine, Ser: 1.08 mg/dL — ABNORMAL HIGH (ref 0.44–1.00)
GFR, Estimated: 49 mL/min — ABNORMAL LOW (ref >60)
Glucose, Bld: 173 mg/dL — ABNORMAL HIGH (ref 70–99)
Potassium: 3.5 mmol/L (ref 3.5–5.1)
Sodium: 137 mmol/L (ref 135–145)
Total Bilirubin: 0.8 mg/dL (ref 0.3–1.2)
Total Protein: 6.3 g/dL — ABNORMAL LOW (ref 6.5–8.1)

## 2021-05-27 LAB — GLUCOSE, CAPILLARY
Glucose-Capillary: 112 mg/dL — ABNORMAL HIGH (ref 70–99)
Glucose-Capillary: 125 mg/dL — ABNORMAL HIGH (ref 70–99)
Glucose-Capillary: 154 mg/dL — ABNORMAL HIGH (ref 70–99)
Glucose-Capillary: 163 mg/dL — ABNORMAL HIGH (ref 70–99)
Glucose-Capillary: 167 mg/dL — ABNORMAL HIGH (ref 70–99)
Glucose-Capillary: 240 mg/dL — ABNORMAL HIGH (ref 70–99)

## 2021-05-27 LAB — ECHOCARDIOGRAM COMPLETE
Area-P 1/2: 3.31 cm2
Height: 67 in
MV VTI: 1.09 cm2
S' Lateral: 3.8 cm
Weight: 1509.71 oz

## 2021-05-27 LAB — MAGNESIUM: Magnesium: 2.4 mg/dL (ref 1.7–2.4)

## 2021-05-27 LAB — C-REACTIVE PROTEIN: CRP: 17.8 mg/dL — ABNORMAL HIGH (ref <1.0)

## 2021-05-27 LAB — D-DIMER, QUANTITATIVE: D-Dimer, Quant: 1.12 ug/mL-FEU — ABNORMAL HIGH (ref 0.00–0.50)

## 2021-05-27 LAB — FERRITIN: Ferritin: 155 ng/mL (ref 11–307)

## 2021-05-27 LAB — PHOSPHORUS: Phosphorus: 2.1 mg/dL — ABNORMAL LOW (ref 2.5–4.6)

## 2021-05-27 MED ORDER — FUROSEMIDE 10 MG/ML IJ SOLN
20.0000 mg | Freq: Every day | INTRAMUSCULAR | Status: DC
Start: 2021-05-27 — End: 2021-05-31
  Administered 2021-05-27 – 2021-05-31 (×5): 20 mg via INTRAVENOUS
  Filled 2021-05-27 (×5): qty 2

## 2021-05-27 MED ORDER — ORAL CARE MOUTH RINSE
15.0000 mL | Freq: Two times a day (BID) | OROMUCOSAL | Status: DC
  Administered 2021-05-27 – 2021-06-06 (×19): 15 mL via OROMUCOSAL

## 2021-05-27 MED ORDER — POTASSIUM & SODIUM PHOSPHATES 280-160-250 MG PO PACK
1.0000 | PACK | Freq: Three times a day (TID) | ORAL | Status: DC
Start: 2021-05-27 — End: 2021-05-28
  Administered 2021-05-27 – 2021-05-28 (×4): 1 via ORAL
  Filled 2021-05-27 (×6): qty 1

## 2021-05-27 NOTE — Progress Notes (Signed)
  Echocardiogram 2D Echocardiogram has been performed.  Darlina Sicilian M 05/27/2021, 11:25 AM

## 2021-05-27 NOTE — Plan of Care (Signed)

## 2021-05-27 NOTE — Plan of Care (Signed)
  Problem: Nutrition: Goal: Adequate nutrition will be maintained Outcome: Progressing   Problem: Coping: Goal: Level of anxiety will decrease Outcome: Progressing   

## 2021-05-27 NOTE — Progress Notes (Addendum)
PROGRESS NOTE    Amy Wolf  HQI:[id number] DOB: [birth year] DOA: 05/26/2021 PCP: Fredirick Lathe, PA-C  Chief Complaint  Patient presents with   Shortness of Breath    Brief Narrative:   Amy Wolf is a 87 y.o. female with medical history significant of permanent atrial fibrillation, pacemaker placement, coronary artery disease, hypertension, CAD/CABG, chronic systolic heart failure, stage IIIa CKD, type II DM, glaucoma, osteoarthritis, hyperlipidemia, migraine headaches, urinary incontinence who is coming with generalized weakness and dyspnea . She was febrile, and hypoxic. COVID 19 positive. CXR showed cardiomegaly with central pulm vessels prominence suggesting CHF. There were increased interstitial and alveolar markings in the perihilar regions and lower lung fields, more so on the right side suggesting asymmetric pulmonary edema or bilateral pneumonia.   Assessment & Plan:   Principal Problem:   Acute respiratory failure with hypoxia (HCC) Active Problems:   Benign essential HTN   Type 2 diabetes mellitus with diabetic chronic kidney disease (HCC)   CAD (coronary artery disease)   Glaucoma   Persistent atrial fibrillation (HCC)   Hypokalemia   Acute respiratory disease due to COVID-19 virus   Chronic systolic CHF (congestive heart failure) (HCC)   Acute respiratory failure with hypoxia probably secondary to a combination of COVID-19 infection and acute CHF. Patient was started on IV remdesivir and Solu-Medrol followed by prednisone taper Continue with bronchodilators.  Follow-up inflammatory markers. Patient currently on 2 L of nasal cannula oxygen to keep sats greater than 90%     Mild acute on chronic systolic heart failure Patient received IV Lasix 20 mg and chest x-ray showed increased interstitial and alveolar markings suggesting pulmonary edema Continue with Lasix, ACE inhibitor and beta-blocker. Strict intake and output and daily weights. Echocardiogram  ordered.   Type 2 diabetes mellitus, insulin-dependent uncontrolled with hyperglycemia CBG (last 3)  Recent Labs    05/27/21 0404 05/27/21 0730 05/27/21 1146  GLUCAP 163* 125* 154*   Resume SSI, and Lantus.   Persistent atrial fibrillation CHA2DS2-VASc score of 7 Continue with Eliquis for anticoagulation and metoprolol for rate control.    Hypertension Blood pressure parameters appear to be optimal at this time.    History of coronary artery disease Patient currently denies any chest pain Continue with beta-blocker and statin.     Hypokalemia and hypophosphatemia Replaced. Recheck in the morning.    Mild anemia of chronic disease Continue to monitor  PALLIATIVE care consulted, Therapy evals ordered.  Dietary consulted.   DVT prophylaxis: ELIQUIS Code Status: (Full Code) Family Communication: none at bedside.discussed with daughter over the phone. She is agreeable to SNF and palliative care consult.  Disposition:   Status is: Inpatient  Remains inpatient appropriate because: IV antibiotics,        Consultants:  None.   Procedures: echocardiogram.   Antimicrobials:  Antibiotics Given (last 72 hours)     Date/Time Action Medication Dose Rate   05/26/21 1522 New Bag/Given   remdesivir 100 mg in sodium chloride 0.9 % 100 mL IVPB 100 mg 200 mL/hr   05/26/21 1610 New Bag/Given   remdesivir 100 mg in sodium chloride 0.9 % 100 mL IVPB 100 mg 200 mL/hr   05/26/21 2106 New Bag/Given   levofloxacin (LEVAQUIN) IVPB 750 mg 750 mg 100 mL/hr   05/27/21 1027 New Bag/Given   remdesivir 100 mg in sodium chloride 0.9 % 100 mL IVPB 100 mg 200 mL/hr         Subjective: Pt reports cough, sob on ambulation.   Objective:  Vitals:   05/26/21 2053 05/27/21 0111 05/27/21 0418 05/27/21 0504  BP:  (!) 92/56  (!) 100/57  Pulse:  77  83  Resp:  16  20  Temp:  97.6 F (36.4 C)  97.6 F (36.4 C)  TempSrc:  Oral  Oral  SpO2: 97% 98%  99%  Weight:   42.8 kg    Height:        Intake/Output Summary (Last 24 hours) at 05/27/2021 1515 Last data filed at 05/27/2021 0500 Gross per 24 hour  Intake 300 ml  Output 500 ml  Net -200 ml   Filed Weights   05/26/21 1432 05/26/21 1718 05/27/21 0418  Weight: 44 kg 42.7 kg 42.8 kg    Examination:  General exam: cachetic looking lady not in distress.  Respiratory system: diminished air entry at bases, no wheezing heard, on 2 lit of [place] oxygen.  Cardiovascular system: S1 & S2 heard, RRR. No JVD,  No pedal edema. Gastrointestinal system: Abdomen is nondistended, soft and nontender.  Normal bowel sounds heard. Central nervous system: Alert and oriented to person and place.  Extremities: Symmetric 5 x 5 power. Skin: No rashes, lesions or ulcers Psychiatry: Mood & affect appropriate.     Data Reviewed: I have personally reviewed following labs and imaging studies  CBC: Recent Labs  Lab 05/26/21 1400 05/27/21 0406  WBC 10.9* 12.4*  NEUTROABS 9.7* 11.5*  HGB 12.1 11.7*  HCT 36.8 35.0*  MCV 96.8 95.9  PLT 188 696    Basic Metabolic Panel: Recent Labs  Lab 05/26/21 1400 05/27/21 0406  NA 138 137  K 3.3* 3.5  CL 108 106  CO2 19* 24  GLUCOSE 347* 173*  BUN 24* 29*  CREATININE 1.11* 1.08*  CALCIUM 8.5* 8.6*  MG  --  2.4  PHOS  --  2.1*    GFR: Estimated Creatinine Clearance: 23.4 mL/min (A) (by C-G formula based on SCr of 1.08 mg/dL (H)).  Liver Function Tests: Recent Labs  Lab 05/26/21 1400 05/27/21 0406  AST 35 26  ALT 32 23  ALKPHOS 51 42  BILITOT 1.0 0.8  PROT 7.5 6.3*  ALBUMIN 3.9 3.3*    CBG: Recent Labs  Lab 05/26/21 1956 05/27/21 0014 05/27/21 0404 05/27/21 0730 05/27/21 1146  GLUCAP 318* 240* 163* 125* 154*     Recent Results (from the past 240 hour(s))  Resp Panel by RT-PCR (Flu A&B, Covid) Nasopharyngeal Swab     Status: Abnormal   Collection Time: 05/26/21  1:35 PM   Specimen: Nasopharyngeal Swab; Nasopharyngeal(NP) swabs in vial transport medium   Result Value Ref Range Status   SARS Coronavirus 2 by RT PCR POSITIVE (A) NEGATIVE Final    Comment: CRITICAL RESULT CALLED TO, READ BACK BY AND VERIFIED WITH: KIERRA, RN @ 2952 ON 05/26/2021 BY LBROOKS, MLT (NOTE) SARS-CoV-2 target nucleic acids are DETECTED.  The SARS-CoV-2 RNA is generally detectable in upper respiratory specimens during the acute phase of infection. Positive results are indicative of the presence of the identified virus, but do not rule out bacterial infection or co-infection with other pathogens not detected by the test. Clinical correlation with patient history and other diagnostic information is necessary to determine patient infection status. The expected result is Negative.  Fact Sheet for Patients: EntrepreneurPulse.com.au  Fact Sheet for Healthcare Providers: IncredibleEmployment.be  This test is not yet approved or cleared by the [place] FDA and  has been authorized for detection and/or diagnosis of SARS-CoV-2 by FDA under an Emergency Use  Authorization (EUA).  This EUA will remain in effect (m eaning this test can be used) for the duration of  the COVID-19 declaration under Section 564(b)(1) of the Act, 21 U.S.C. section 360bbb-3(b)(1), unless the authorization is terminated or revoked sooner.     Influenza A by PCR NEGATIVE NEGATIVE Final   Influenza B by PCR NEGATIVE NEGATIVE Final    Comment: (NOTE) The Xpert Xpress SARS-CoV-2/FLU/RSV plus assay is intended as an aid in the diagnosis of influenza from Nasopharyngeal swab specimens and should not be used as a sole basis for treatment. Nasal washings and aspirates are unacceptable for Xpert Xpress SARS-CoV-2/FLU/RSV testing.  Fact Sheet for Patients: EntrepreneurPulse.com.au  Fact Sheet for Healthcare Providers: IncredibleEmployment.be  This test is not yet approved or cleared by the [place] FDA and has been  authorized for detection and/or diagnosis of SARS-CoV-2 by FDA under an Emergency Use Authorization (EUA). This EUA will remain in effect (meaning this test can be used) for the duration of the COVID-19 declaration under Section 564(b)(1) of the Act, 21 U.S.C. section 360bbb-3(b)(1), unless the authorization is terminated or revoked.  Performed at [facility], [place] [address] [place], [place] [address]          Radiology Studies: [facility] Chest Port 1 View  Result Date: 05/26/2021 CLINICAL DATA:  Shortness of breath EXAM: PORTABLE CHEST 1 VIEW COMPARISON:  None. FINDINGS: Transverse diameter of heart is increased. There is evidence of previous coronary bypass surgery. Pacemaker/defibrillator battery is seen in the left infraclavicular region. Central pulmonary vessels are prominent. Increased interstitial and alveolar markings are seen in parahilar regions and lower lung fields, more so on the right side. There is blunting of left lateral CP angle. There is no pneumothorax. IMPRESSION: Cardiomegaly. Central pulmonary vessels are prominent suggesting CHF. Increased interstitial and alveolar markings are seen in the parahilar regions and lower lung fields, more so on the right side suggesting asymmetric pulmonary edema or bilateral pneumonia. Electronically Signed   By: Elmer Picker M.D.   On: 05/26/2021 14:17   ECHOCARDIOGRAM COMPLETE  Result Date: 05/27/2021    ECHOCARDIOGRAM REPORT   Patient Name:   Clelia Cerros  Date of Exam: 05/27/2021 Medical Rec #:  [id number]  Height:       67.0 in Accession #:    [id number] Weight:       94.4 lb Date of Birth:  1927/11/26  BSA:          1.470 m Patient Age:    67 years   BP:           100/57 mmHg Patient Gender: F          HR:           82 bpm. Exam Location:  Inpatient Procedure: 2D Echo, Cardiac Doppler, Color Doppler and 3D Echo Indications:    CHF-Acute Systolic V78.58  History:        Patient has prior history of Echocardiogram  examinations, most                 recent 04/12/2019. CHF, CAD, Prior CABG and Defibrillator, PAD,                 Arrythmias:Alternating bundle branch blocks.,                 Signs/Symptoms:Fever; Risk Factors:Diabetes, Dyslipidemia and                 Hypertension. Covid. Chronic kidney disease  Tricuspid Valve Annular Ring 2007.                  Mitral Valve: prosthetic annuloplasty ring valve is present in                 the mitral position. Procedure Date: 2007.  Sonographer:    Darlina Sicilian RDCS Referring Phys: [id number] DAVID MANUEL [place]  1. Left ventricular ejection fraction, by estimation, is 30 to 35%. Left ventricular ejection fraction by 3D volume is 30 %. The left ventricle has moderately decreased function. The left ventricle demonstrates global hypokinesis. There is moderate asymmetric left ventricular hypertrophy of the basal-septal segment. Left ventricular diastolic function could not be evaluated.  2. Right ventricular systolic function is mildly reduced. The right ventricular size is normal. There is moderately elevated pulmonary artery systolic pressure. The estimated right ventricular systolic pressure is 27.0 mmHg.  3. Left atrial size was moderately dilated.  4. The mitral valve has been repaired/replaced. Trivial mitral valve regurgitation. No evidence of mitral stenosis. The mean mitral valve gradient is 3.0 mmHg. There is a prosthetic annuloplasty ring present in the mitral position. Procedure Date: 2007.  5. The tricuspid valve is has been repaired/replaced. The tricuspid valve is status post repair with an annuloplasty ring. Tricuspid valve regurgitation is mild to moderate.  6. The aortic valve is tricuspid. Aortic valve regurgitation is not visualized.  7. The inferior vena cava is normal in size with <50% respiratory variability, suggesting right atrial pressure of 8 mmHg. Comparison(s): No significant change from prior study. 04/12/2019: LVEF 30%, global  HK, RVSP 53 mmHg. FINDINGS  Left Ventricle: Left ventricular ejection fraction, by estimation, is 30 to 35%. Left ventricular ejection fraction by 3D volume is 30 %. The left ventricle has moderately decreased function. The left ventricle demonstrates global hypokinesis. The left ventricular internal cavity size was normal in size. There is moderate asymmetric left ventricular hypertrophy of the basal-septal segment. Left ventricular diastolic function could not be evaluated due to atrial fibrillation. Left ventricular diastolic function could not be evaluated. Right Ventricle: The right ventricular size is normal. No increase in right ventricular wall thickness. Right ventricular systolic function is mildly reduced. There is moderately elevated pulmonary artery systolic pressure. The tricuspid regurgitant velocity is 3.22 m/s, and with an assumed right atrial pressure of 8 mmHg, the estimated right ventricular systolic pressure is 35.0 mmHg. Left Atrium: Left atrial size was moderately dilated. Right Atrium: Right atrial size was normal in size. Pericardium: There is no evidence of pericardial effusion. Mitral Valve: The mitral valve has been repaired/replaced. Trivial mitral valve regurgitation. There is a prosthetic annuloplasty ring present in the mitral position. Procedure Date: 2007. No evidence of mitral valve stenosis. MV peak gradient, 7.6 mmHg.  The mean mitral valve gradient is 3.0 mmHg. Tricuspid Valve: The tricuspid valve is has been repaired/replaced. Tricuspid valve regurgitation is mild to moderate. The tricuspid valve is status post repair with an annuloplasty ring. Aortic Valve: The aortic valve is tricuspid. Aortic valve regurgitation is not visualized. Pulmonic Valve: The pulmonic valve was grossly normal. Pulmonic valve regurgitation is not visualized. Aorta: The aortic root and ascending aorta are structurally normal, with no evidence of dilitation. Venous: The inferior vena cava is normal in  size with less than 50% respiratory variability, suggesting right atrial pressure of 8 mmHg. IAS/Shunts: No atrial level shunt detected by color flow Doppler. Additional Comments: A device lead is visualized.  LEFT VENTRICLE PLAX 2D LVIDd:  4.50 cm LVIDs:         3.80 cm LV PW:         0.90 cm         3D Volume EF LV IVS:        1.50 cm         LV 3D EF:    Left LVOT diam:     1.90 cm                      ventricul LV SV:         30                           ar LV SV Index:   20                           ejection LVOT Area:     2.84 cm                     fraction                                             by 3D                                             volume is                                             30 %.                                 3D Volume EF:                                3D EF:        30 %                                LV EDV:       133 ml                                LV ESV:       94 ml                                LV SV:        39 ml RIGHT VENTRICLE RV S prime:     7.03 cm/s LEFT ATRIUM             Index        RIGHT ATRIUM           Index LA diam:        4.80 cm 3.27 cm/m   RA Area:     14.20 cm  LA Vol (A2C):   47.3 ml 32.19 ml/m  RA Volume:   34.00 ml  23.14 ml/m LA Vol (A4C):   57.6 ml 39.20 ml/m LA Biplane Vol: 54.0 ml 36.75 ml/m  AORTIC VALVE LVOT Vmax:   62.00 cm/s LVOT Vmean:  39.233 cm/s LVOT VTI:    0.106 m  AORTA Ao Root diam: 3.00 cm Ao Asc diam:  2.90 cm MITRAL VALVE                TRICUSPID VALVE MV Area (PHT): 3.31 cm     TV Peak grad:   3.3 mmHg MV Area VTI:   1.09 cm     TV Mean grad:   1.7 mmHg MV Peak grad:  7.6 mmHg     TV Vmax:        0.90 m/s MV Mean grad:  3.0 mmHg     TV Vmean:       59.4 cm/s MV Vmax:       1.38 m/s     TV VTI:         0.20 msec MV Vmean:      81.0 cm/s    TR Peak grad:   41.5 mmHg MV Decel Time: 229 msec     TR Vmax:        322.00 cm/s MV E velocity: 104.37 cm/s                             SHUNTS                             Systemic  VTI:  0.11 m                             Systemic Diam: 1.90 cm Lyman Bishop MD Electronically signed by Lyman Bishop MD Signature Date/Time: 05/27/2021/12:14:25 PM    Final         Scheduled Meds:  albuterol  2 puff Inhalation BID   apixaban  2.5 mg Oral BID   vitamin C  500 mg Oral Daily   brimonidine  1 drop Right Eye BID   ferrous sulfate  325 mg Oral Q breakfast   furosemide  20 mg Intravenous Daily   insulin aspart  0-15 Units Subcutaneous Q4H   insulin detemir  0.1 Units/kg Subcutaneous BID   latanoprost  1 drop Both Eyes QHS   linagliptin  5 mg Oral Daily   melatonin  3 mg Oral QHS   methylPREDNISolone (SOLU-MEDROL) injection  40 mg Intravenous Daily   Followed by   Derrill Memo ON 05/29/2021] predniSONE  50 mg Oral Daily   Netarsudil Dimesylate  1 drop Both Eyes QHS   potassium & sodium phosphates  1 packet Oral TID WC & HS   pravastatin  80 mg Oral Daily   zinc sulfate  220 mg Oral Daily   Continuous Infusions:  [START ON 05/28/2021] levofloxacin (LEVAQUIN) IV     remdesivir 100 mg in NS 100 mL 100 mg (05/27/21 1027)     LOS: 0 days       Hosie Poisson, MD [facility]   To contact the attending provider between 7A-7P or the covering provider during after hours 7P-7A, please log into the web site [email or web address] and access using universal [place] password for that web site. If you do not have the  password, please call the hospital operator.  05/27/2021, 3:15 PM

## 2021-05-28 DIAGNOSIS — Z7189 Other specified counseling: Secondary | ICD-10-CM

## 2021-05-28 DIAGNOSIS — U071 COVID-19: Principal | ICD-10-CM

## 2021-05-28 LAB — COMPREHENSIVE METABOLIC PANEL
ALT: 24 U/L (ref 0–44)
AST: 25 U/L (ref 15–41)
Albumin: 3.3 g/dL — ABNORMAL LOW (ref 3.5–5.0)
Alkaline Phosphatase: 39 U/L (ref 38–126)
Anion gap: 11 (ref 5–15)
BUN: 46 mg/dL — ABNORMAL HIGH (ref 8–23)
CO2: 22 mmol/L (ref 22–32)
Calcium: 8.6 mg/dL — ABNORMAL LOW (ref 8.9–10.3)
Chloride: 108 mmol/L (ref 98–111)
Creatinine, Ser: 1.2 mg/dL — ABNORMAL HIGH (ref 0.44–1.00)
GFR, Estimated: 43 mL/min — ABNORMAL LOW (ref >60)
Glucose, Bld: 98 mg/dL (ref 70–99)
Potassium: 3.3 mmol/L — ABNORMAL LOW (ref 3.5–5.1)
Sodium: 141 mmol/L (ref 135–145)
Total Bilirubin: 0.9 mg/dL (ref 0.3–1.2)
Total Protein: 6.3 g/dL — ABNORMAL LOW (ref 6.5–8.1)

## 2021-05-28 LAB — CBC WITH DIFFERENTIAL/PLATELET
Abs Immature Granulocytes: 0.06 10*3/uL (ref 0.00–0.07)
Basophils Absolute: 0 10*3/uL (ref 0.0–0.1)
Basophils Relative: 0 %
Eosinophils Absolute: 0 10*3/uL (ref 0.0–0.5)
Eosinophils Relative: 0 %
HCT: 34 % — ABNORMAL LOW (ref 36.0–46.0)
Hemoglobin: 11.3 g/dL — ABNORMAL LOW (ref 12.0–15.0)
Immature Granulocytes: 1 %
Lymphocytes Relative: 6 %
Lymphs Abs: 0.7 10*3/uL (ref 0.7–4.0)
MCH: 31.6 pg (ref 26.0–34.0)
MCHC: 33.2 g/dL (ref 30.0–36.0)
MCV: 95 fL (ref 80.0–100.0)
Monocytes Absolute: 0.6 10*3/uL (ref 0.1–1.0)
Monocytes Relative: 6 %
Neutro Abs: 9.5 10*3/uL — ABNORMAL HIGH (ref 1.7–7.7)
Neutrophils Relative %: 87 %
Platelets: 185 10*3/uL (ref 150–400)
RBC: 3.58 MIL/uL — ABNORMAL LOW (ref 3.87–5.11)
RDW: 13.2 % (ref 11.5–15.5)
WBC: 10.9 10*3/uL — ABNORMAL HIGH (ref 4.0–10.5)
nRBC: 0 % (ref 0.0–0.2)

## 2021-05-28 LAB — GLUCOSE, CAPILLARY
Glucose-Capillary: 101 mg/dL — ABNORMAL HIGH (ref 70–99)
Glucose-Capillary: 103 mg/dL — ABNORMAL HIGH (ref 70–99)
Glucose-Capillary: 105 mg/dL — ABNORMAL HIGH (ref 70–99)
Glucose-Capillary: 230 mg/dL — ABNORMAL HIGH (ref 70–99)
Glucose-Capillary: 273 mg/dL — ABNORMAL HIGH (ref 70–99)
Glucose-Capillary: 86 mg/dL (ref 70–99)

## 2021-05-28 LAB — PHOSPHORUS: Phosphorus: 4.5 mg/dL (ref 2.5–4.6)

## 2021-05-28 LAB — D-DIMER, QUANTITATIVE: D-Dimer, Quant: 1.19 ug/mL-FEU — ABNORMAL HIGH (ref 0.00–0.50)

## 2021-05-28 LAB — MAGNESIUM: Magnesium: 2.3 mg/dL (ref 1.7–2.4)

## 2021-05-28 LAB — FERRITIN: Ferritin: 180 ng/mL (ref 11–307)

## 2021-05-28 LAB — C-REACTIVE PROTEIN: CRP: 16.1 mg/dL — ABNORMAL HIGH (ref <1.0)

## 2021-05-28 MED ORDER — ADULT MULTIVITAMIN W/MINERALS CH
1.0000 | ORAL_TABLET | Freq: Every day | ORAL | Status: DC
  Administered 2021-05-28 – 2021-06-06 (×10): 1 via ORAL
  Filled 2021-05-28 (×10): qty 1

## 2021-05-28 MED ORDER — ENSURE ENLIVE PO LIQD
237.0000 mL | Freq: Two times a day (BID) | ORAL | Status: DC
  Administered 2021-05-28 – 2021-06-03 (×6): 237 mL via ORAL

## 2021-05-28 NOTE — Consult Note (Signed)
Consultation Note Date: 05/28/2021   Patient Name: Amy Wolf  DOB: 03/14/1928  MRN: [id number]  Age / Sex: 87 y.o., female  PCP: Allwardt, Randa Evens, PA-C Referring Physician: Georgette Shell, MD  Reason for Consultation: Goals of care  HPI/Patient Profile: 87 y.o. female  with past medical history of a fib, CAD, HTN, CAD, CABG, CHF, DM2 admitted on 05/26/2021 with Covid. Palliative medicine consulted for goals of care.   Primary Decision Maker PATIENT- has HCPOA on chart for daughter if she is unable to make decisions  Discussion: Met with Amy Wolf at bedside. She is awake, alert, and oriented x3 plus situation.  Lives at [place]. Her spouse lives there also- he is in rehab. She shares that prior to admission she was ambulatory and independent with ADL's.  Her primary goal of care is to improve her functional status and retury home with her husband.  She would accept artificial life support "for as long as it took" unless providers were certain that she would not recover, then she would not wish to continue artificial life support.    SUMMARY OF RECOMMENDATIONS -Continue current scope with goal of returning to [place]: Full code   Prognosis:   Unable to determine  Discharge Planning:  SNF, rehab  Primary Diagnoses: Present on Admission:  Acute respiratory failure with hypoxia ([place])  Benign essential HTN  CAD (coronary artery disease)  Type 2 diabetes mellitus with diabetic chronic kidney disease ([place])  Persistent atrial fibrillation ([place])  Glaucoma  Hypokalemia  Acute respiratory disease due to COVID-19 virus     Physical Exam Vitals and nursing note reviewed.  Constitutional:      Comments: frail  Cardiovascular:     Pulses: Normal pulses.  Pulmonary:     Effort: Pulmonary effort is normal.  Neurological:     Mental Status: She is  oriented to person, place, and time.    Vital Signs: BP (!) 142/65 (BP Location: Right Arm)   Pulse 84   Temp 98 F (36.7 C) (Oral)   Resp 20   Ht $R'5\' 7"'gH$  (1.702 m)   Wt 41.8 kg   SpO2 97%   BMI 14.43 kg/m  Pain Scale: 0-10   Pain Score: 0-No pain   SpO2: SpO2: 97 % O2 Device:SpO2: 97 % O2 Flow Rate: .O2 Flow Rate (L/min): 2 L/min  IO: Intake/output summary:  Intake/Output Summary (Last 24 hours) at 05/28/2021 1438 Last data filed at 05/28/2021 1136 Gross per 24 hour  Intake 660 ml  Output 975 ml  Net -315 ml    LBM: Last BM Date: 05/25/21 Baseline Weight: Weight: 44 kg Most recent weight: Weight: 41.8 kg     Palliative Assessment/Data: PPS: 50%       Thank you for this consult. Palliative medicine will continue to follow and assist as needed.   Time In: 1400 Time Out: 1446 Time Total: 46 minutes Greater than 50%  of this time was spent counseling and coordinating care related to  the above assessment and plan.  Signed by: Mariana Kaufman, AGNP-C Palliative Medicine    Please contact Palliative Medicine Team phone at [phone] for questions and concerns.  For individual provider: See Shea Evans

## 2021-05-28 NOTE — Evaluation (Signed)
Physical Therapy Evaluation Patient Details Name: Amy Wolf MRN: [id number] DOB: [birth year] Today's Date: 05/28/2021  History of Present Illness  Jarita Wolf is a 87 y.o. female with medical history significant of permanent atrial fibrillation, pacemaker placement, coronary artery disease, hypertension, CAD/CABG, chronic systolic heart failure, stage IIIa CKD, type II DM, glaucoma, osteoarthritis, hyperlipidemia, migraine headaches, urinary incontinence who is coming with generalized weakness and dyspnea . She was febrile, and hypoxic. COVID 19 positive. Pt admitetd with Acute respiratory failure with hypoxia probably secondary to a combination of COVID-19 infection and acute CHF.  Clinical Impression  The patient required mod assistance to sit up and max to pivot to recliner then a few steps to recliner. Decreased balance noted,. At baseline ambulatory in home, no falls recently per patient. Patient living at home alone, spouse in a facility. Daughter assisting PRN.   Pt admitted with above diagnosis.   Pt currently with functional limitations due to the deficits listed below (see PT Problem List). Pt will benefit from skilled PT to increase their independence and safety with mobility to allow discharge to the venue listed below.        Recommendations for follow up therapy are one component of a multi-disciplinary discharge planning process, led by the attending physician.  Recommendations may be updated based on patient status, additional functional criteria and insurance authorization.  Follow Up Recommendations Skilled nursing-short term rehab (<3 hours/day)    Assistance Recommended at Discharge Frequent or constant Supervision/Assistance  Functional Status Assessment Patient has had a recent decline in their functional status and demonstrates the ability to make significant improvements in function in a reasonable and predictable amount of time.  Equipment Recommendations  None recommended  by PT    Recommendations for Other Services       Precautions / Restrictions Precautions Precautions: Fall Precaution Comments: Airborn, does not control legs when stnading. slide out      Mobility  Bed Mobility Overal bed mobility: Needs Assistance Bed Mobility: Supine to Sit     Supine to sit: Min assist;Mod assist Sit to supine: Min assist   General bed mobility comments: Pt able to raise trunk with supervision but unable to scoot anteriorly to EOB to touch feet to floor without Mod As.    Transfers Overall transfer level: Needs assistance Equipment used: Rolling walker (2 wheels) Transfers: Sit to/from Stand Sit to Stand: Mod assist           General transfer comment: mod assist to rise, legs not controlled, sliding, during pivot to BSC. Used RW with some improvement for stability but still mod assist.    Ambulation/Gait                  Stairs            Wheelchair Mobility    Modified Rankin (Stroke Patients Only)       Balance Overall balance assessment: Needs assistance Sitting-balance support: Feet supported;No upper extremity supported Sitting balance-Leahy Scale: Good     Standing balance support: Reliant on assistive device for balance;During functional activity Standing balance-Leahy Scale: Poor Standing balance comment: Requires BUE support and external assistance.                             Pertinent Vitals/Pain Pain Assessment: No/denies pain    Home Living Family/patient expects to be discharged to:: Skilled nursing facility Living Arrangements: Spouse/significant other;Other (Comment);Children Available Help at Discharge:  (unsure) Type  of Home: House Home Access: Stairs to enter Entrance Stairs-Rails: Chemical engineer of Steps: 2   Home Layout: One level Home Equipment: Grab bars - toilet;Rollator (4 wheels);Rolling Walker (2 wheels);BSC/3in1;Transport chair;Cane - single  point Additional Comments: Lift recliner.    Prior Function Prior Level of Function : Needs assist       Physical Assist : ADLs (physical);Mobility (physical)     Mobility Comments: has been alone at times while spouse in rehab,, ambulates with a rollator. ADLs Comments: Pt's daughter, [name] is a retired Marine scientist, lives close by. Baseline assists with bringing groceries, cooking and meal prep, and laundry. Pt has a housekeeper 1x month. Pt and daughter then do light cleaning in between house visits. Pt reports that she performs her BADLs independently. Pt reports that she does not plan on driving anymore.     Hand Dominance   Dominant Hand: Right    Extremity/Trunk Assessment   Upper Extremity Assessment Upper Extremity Assessment: Generalized weakness    Lower Extremity Assessment Lower Extremity Assessment: Generalized weakness (decreased support  when standing)    Cervical / Trunk Assessment Cervical / Trunk Assessment: Kyphotic  Communication   Communication: No difficulties  Cognition Arousal/Alertness: Awake/alert Behavior During Therapy: WFL for tasks assessed/performed Overall Cognitive Status: Within Functional Limits for tasks assessed                                 General Comments: Oriented to person, place and month. Not to year . knows she has covid        General Comments      Exercises     Assessment/Plan    PT Assessment Patient needs continued PT services  PT Problem List Decreased strength;Decreased balance;Decreased cognition;Decreased knowledge of precautions;Decreased activity tolerance;Decreased knowledge of use of DME       PT Treatment Interventions DME instruction;Therapeutic activities;Cognitive remediation;Gait training;Therapeutic exercise;Patient/family education;Functional mobility training    PT Goals (Current goals can be found in the Care Plan section)  Acute Rehab PT Goals Patient Stated Goal: to get better PT  Goal Formulation: With patient Time For Goal Achievement: 06/11/21 Potential to Achieve Goals: Fair    Frequency Min 2X/week   Barriers to discharge Decreased caregiver support      Co-evaluation               AM-PAC PT "6 Clicks" Mobility  Outcome Measure Help needed turning from your back to your side while in a flat bed without using bedrails?: A Lot Help needed moving from lying on your back to sitting on the side of a flat bed without using bedrails?: A Lot Help needed moving to and from a bed to a chair (including a wheelchair)?: Total Help needed standing up from a chair using your arms (e.g., wheelchair or bedside chair)?: Total Help needed to walk in hospital room?: Total Help needed climbing 3-5 steps with a railing? : Total 6 Click Score: 8    End of Session Equipment Utilized During Treatment: Gait belt Activity Tolerance: Patient limited by fatigue Patient left: in chair;with call bell/phone within reach Nurse Communication: Mobility status PT Visit Diagnosis: Unsteadiness on feet (R26.81);Difficulty in walking, not elsewhere classified (R26.2)    Time: 8119-1478 PT Time Calculation (min) (ACUTE ONLY): 50 min   Charges:   PT Evaluation $PT Eval Low Complexity: 1 Low PT Treatments $Therapeutic Activity: 8-22 mins $Self Care/Home Management: 8-22  [place] Pager [phone] Office [phone]   Claretha Cooper 05/28/2021, 3:59 PM

## 2021-05-28 NOTE — Evaluation (Signed)
Occupational Therapy Evaluation Patient Details Name: Amy Wolf MRN: [id number] DOB: [birth year] Today's Date: 05/28/2021   History of Present Illness Amy Wolf is a 87 y.o. female with medical history significant of permanent atrial fibrillation, pacemaker placement, coronary artery disease, hypertension, CAD/CABG, chronic systolic heart failure, stage IIIa CKD, type II DM, glaucoma, osteoarthritis, hyperlipidemia, migraine headaches, urinary incontinence who is coming with generalized weakness and dyspnea . She was febrile, and hypoxic. COVID 19 positive. Pt admitetd with Acute respiratory failure with hypoxia probably secondary to a combination of COVID-19 infection and acute CHF.   Clinical Impression   Patient is currently requiring assistance with ADLs including moderate assist with toileting (currently on purewich with baseline urinary incontinence, moderate assist with LE dressing while seated due to unsteadiness in standing, moderate assist with LB bathing, and moderate assist with taking a few steps up to [facility] with RW.  Current level of function is below patient's typical baseline.  During this evaluation, patient was limited by generalized weakness, impaired activity tolerance, mild confusion vs memory loss, and poor standing balance, all of which has the potential to impact patient's safety and independence during functional mobility, as well as performance for ADLs.  Patient lives with her husband who is currently in [place] in [place]. Pt's daughter lives nearby and is a retired Marine scientist but available only for PRN supervision and assistance.  Patient demonstrates fair rehab potential, and should benefit from continued skilled occupational therapy services while in acute care to maximize safety, independence and quality of life at home.  Continued occupational therapy services in a SNF setting prior to return home is recommended.  ?    Recommendations for follow up therapy are one component of  a multi-disciplinary discharge planning process, led by the attending physician.  Recommendations may be updated based on patient status, additional functional criteria and insurance authorization.   Follow Up Recommendations  Skilled nursing-short term rehab (<3 hours/day)    Assistance Recommended at Discharge Frequent or constant Supervision/Assistance  Functional Status Assessment  Patient has had a recent decline in their functional status and demonstrates the ability to make significant improvements in function in a reasonable and predictable amount of time.  Equipment Recommendations  Other (comment) (adaptive equipment/"hip kit")    Recommendations for Other Services       Precautions / Restrictions Precautions Precautions: Fall Precaution Comments: Airborn Restrictions Weight Bearing Restrictions: No      Mobility Bed Mobility Overal bed mobility: Needs Assistance Bed Mobility: Supine to Sit;Sit to Supine     Supine to sit: Supervision;Mod assist Sit to supine: Min assist   General bed mobility comments: Pt able to raise trunk with supervision but unable to scoot anteriorly to EOB to touch feet to floor without Mod As.    Transfers Overall transfer level: Needs assistance   Transfers: Sit to/from Stand Sit to Stand: Min assist           General transfer comment: Pt took ~3 latearl step with RW to [facility] with Mod As, very unsteady with pt blocking backs of legs against the bed for added stability.      Balance Overall balance assessment: Needs assistance Sitting-balance support: Feet supported;No upper extremity supported Sitting balance-Leahy Scale: Good     Standing balance support: Reliant on assistive device for balance;During functional activity Standing balance-Leahy Scale: Poor Standing balance comment: Requires BUE support and external assistance.  ADL either performed or assessed with clinical judgement    ADL Overall ADL's : Needs assistance/impaired Eating/Feeding: Modified independent;Bed level   Grooming: Set up;Supervision/safety;Sitting   Upper Body Bathing: Min guard;Sitting   Lower Body Bathing: Moderate assistance;Sit to/from stand;Bed level Lower Body Bathing Details (indicate cue type and reason): Current poor standing balance and poor standing tolerance. Upper Body Dressing : Minimal assistance;Sitting   Lower Body Dressing: Moderate assistance;Sitting/lateral leans Lower Body Dressing Details (indicate cue type and reason): Pt able to doff socks with increased time/effort. Required total assist to don each sock.   Toilet Transfer Details (indicate cue type and reason): Pt declined need for BSC. Pt on pure wick. Baseline urinary incontinence. 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 Pt reports LT eye vision intact without glasses.     Perception     Praxis      Pertinent Vitals/Pain Pain Assessment: No/denies pain     Hand Dominance Right   Extremity/Trunk Assessment Upper Extremity Assessment Upper Extremity Assessment: Generalized weakness   Lower Extremity Assessment Lower Extremity Assessment: Defer to PT evaluation   Cervical / Trunk Assessment Cervical / Trunk Assessment: Kyphotic   Communication Communication Communication: No difficulties   Cognition Arousal/Alertness: Awake/alert Behavior During Therapy: WFL for tasks assessed/performed Overall Cognitive Status: Within Functional Limits for tasks assessed                                  General Comments: Oriented to person, place and month. Not to year or situation.     General Comments       Exercises     Shoulder Instructions      Home Living Family/patient expects to be discharged to:: Skilled nursing facility Living Arrangements: Spouse/significant other;Other (Comment) (Pt's husband is in Rehab at [facility].) Available Help at Discharge: Available PRN/intermittently Type of Home: House Home Access: Stairs to enter [employer] of Steps: 2 Entrance Stairs-Rails: Left;Right Home Layout: One level     Bathroom Shower/Tub: Sponge bathes at baseline   Bathroom Toilet: Handicapped height     Home Equipment: Grab bars - toilet;Rollator (4 wheels);Rolling Walker (2 wheels);BSC/3in1;Transport chair;Cane - single point   Additional Comments: Lift recliner.      Prior Functioning/Environment Prior Level of Function : Needs assist       Physical Assist : ADLs (physical)   ADLs (physical): IADLs   ADLs Comments: Pt's daughter, [name] is a retired Marine scientist, lives close by. Baseline assists with bringing groceries, cooking and meal prep, and laundry. Pt has a housekeeper 1x month. Pt and daughter then do light cleaning in between house visits. Pt reports that she performs her BADLs independently. Pt reports that she does not plan on driving anymore.        OT Problem List: Decreased strength;Cardiopulmonary status limiting activity;Decreased cognition;Decreased activity tolerance;Decreased safety awareness;Impaired balance (sitting and/or standing);Decreased knowledge of use of DME or AE;Impaired vision/perception;Decreased knowledge of precautions      OT Treatment/Interventions: Self-care/ADL training;Therapeutic exercise;Therapeutic activities;Cognitive remediation/compensation;Energy conservation;DME and/or AE instruction;Patient/family education;Balance training    OT Goals(Current goals can be found in the care plan section) Acute  Rehab OT  Goals Patient Stated Goal: To go to [facility] for Rehab, as pt's husband is there. OT Goal Formulation: With patient Time For Goal Achievement: 06/11/21 Potential to Achieve Goals: Fair ADL Goals Pt Will Perform Grooming: standing;with supervision (at least 1 task) Pt Will Perform Lower Body Dressing: with adaptive equipment;sitting/lateral leans;sit to/from stand;with supervision Pt Will Transfer to Toilet: with supervision;ambulating;bedside commode Pt Will Perform Toileting - Clothing Manipulation and hygiene: with supervision;sitting/lateral leans;sit to/from stand Additional ADL Goal #1: Patient will tolerate BUE home exercise program 10-15 reps within pain-free ranges, with virals stable and RPE no greater than 5/10, in an unsupported seated position, in order to improve upper body strength, endurance and core stability needed to complete home ADLs. Additional ADL Goal #2: Patient will identify at least 3 energy conservation strategies to employ at home in order to maximize function and quality of life and decrease caregiver burden while preventing exacerbation of symptoms and rehospitalization.  OT Frequency: Min 2X/week   Barriers to D/C: Decreased caregiver support  Pt's husband is in [facility].       Co-evaluation              AM-PAC OT "6 Clicks" Daily Activity     Outcome Measure Help from another person eating meals?: None Help from another person taking care of personal grooming?: A Little Help from another person toileting, which includes using toliet, bedpan, or urinal?: A Lot Help from another person bathing (including washing, rinsing, drying)?: A Lot Help from another person to put on and taking off regular upper body clothing?: A Little Help from another person to put on and taking off regular lower body clothing?: A Lot 6 Click Score: 16   End of Session Equipment Utilized During Treatment: Gait belt;Rolling walker (2 wheels) Nurse Communication: Other  (comment)  Activity Tolerance: Patient limited by fatigue Patient left: in bed;with call bell/phone within reach;with bed alarm set  OT Visit Diagnosis: Unsteadiness on feet (R26.81);Other symptoms and signs involving cognitive function;Muscle weakness (generalized) (M62.81)                Time: 2481-8590 OT Time Calculation (min): 30 min Charges:  OT General Charges $OT Visit: 1 Visit OT Evaluation $OT Eval Low Complexity: 1 Low OT Treatments $Self Care/Home Management : 8-22 mins  Anderson Malta, OT Acute Rehab Services Office: [phone] 05/28/2021  Julien Girt 05/28/2021, 12:57 PM

## 2021-05-28 NOTE — Plan of Care (Signed)
  Problem: Activity: Goal: Risk for activity intolerance will decrease Outcome: Progressing   Problem: Nutrition: Goal: Adequate nutrition will be maintained Outcome: Progressing   

## 2021-05-28 NOTE — Progress Notes (Signed)
PROGRESS NOTE    Amy Wolf  SJG:[id number] DOB: 02-08-1928 DOA: 05/26/2021 PCP: Fredirick Lathe, PA-C  Brief Narrative: Amy Wolf is a 87 y.o. female with medical history significant of permanent atrial fibrillation, pacemaker placement, coronary artery disease, hypertension, CAD/CABG, chronic systolic heart failure, stage IIIa CKD, type II DM, glaucoma, osteoarthritis, hyperlipidemia, migraine headaches, urinary incontinence who is coming with generalized weakness and dyspnea . She was febrile, and hypoxic. COVID 19 positive. CXR showed cardiomegaly with central pulm vessels prominence suggesting CHF. There were increased interstitial and alveolar markings in the perihilar regions and lower lung fields, more so on the right side suggesting asymmetric pulmonary edema or bilateral pneumonia.    Assessment & Plan:   Principal Problem:   Acute respiratory failure with hypoxia (HCC) Active Problems:   Benign essential HTN   Type 2 diabetes mellitus with diabetic chronic kidney disease (HCC)   CAD (coronary artery disease)   Glaucoma   Persistent atrial fibrillation (HCC)   Hypokalemia   Acute respiratory disease due to COVID-19 virus   Chronic systolic CHF (congestive heart failure) (HCC)   Acute respiratory failure with hypoxia probably secondary to a combination of COVID-19 infection and acute CHF. Oxygen saturation is 97% on 2 L. Patient was started on IV remdesivir and Solu-Medrol followed by prednisone taper Continue with bronchodilators.    Mild acute on chronic systolic heart failure Patient received IV Lasix 20 mg and chest x-ray showed increased interstitial and alveolar markings suggesting pulmonary edema Continue with Lasix, ACE inhibitor and beta-blocker. Creatinine trending up 1.20 from 1.08. Echocardiogram 05/28/2021 with EF of 30 to 35%.  Moderately decreased function of the left ventricle.  Left ventricle demonstrates global hypokinesis and moderate asymmetric LVH.   Right ventricular systolic function is mildly reduced.  Mitral valve has been repaired or replaced.  Tricuspid valve has been repaired or replaced.   Type 2 diabetes mellitus, insulin-dependent uncontrolled with hyperglycemia CBG (last 3)  Recent Labs    05/28/21 0427 05/28/21 0744 05/28/21 1134  GLUCAP 86 103* 230*      Resume SSI, and Lantus.     Persistent atrial fibrillation CHA2DS2-VASc score of 7 Continue with Eliquis for anticoagulation and metoprolol for rate control.   Hypertension pressure 142/65.  Continue metoprolol.    Hypokalemia and hypophosphatemia-potassium 3.3 phosphorus normal after replacement.   Mild anemia of chronic disease hemoglobin remained stable around 11.    PALLIATIVE care consulted, appreciate their input. Nutrition Problem: Severe Malnutrition Etiology: chronic illness (CHF)     Signs/Symptoms: severe fat depletion, severe muscle depletion, percent weight loss Percent weight loss: 6.3 %    Interventions: Ensure Enlive (each supplement provides 350kcal and 20 grams of protein), MVI  Estimated body mass index is 14.43 kg/m as calculated from the following:   Height as of this encounter: 5\' 7"  (1.702 m).   Weight as of this encounter: 41.8 kg.  DVT prophylaxis: ELIQUIS Code Status: (Full Code) Family Communication: none at bedside. Disposition: SNF once stable she is COVID-positive   Status is: Inpatient   Remains inpatient appropriate because: IV antibiotics      Consultants:  None.    Procedures: echocardiogram  Subjective: She is resting in bed she denies any nausea vomiting diarrhea.  She feels her breathing is better.  However she is very annoyed and frustrated that her alarm was going on for 2 hours.  Objective: Vitals:   05/27/21 2016 05/28/21 0430 05/28/21 0500 05/28/21 1114  BP: 133/74 136/73  (!) 142/65  Pulse: 97 87  84  Resp: 20 20  20   Temp: (!) 97.4 F (36.3 C) 98.1 F (36.7 C)  98 F (36.7 C)   TempSrc: Oral Oral  Oral  SpO2: 98% 99%  97%  Weight:   41.8 kg   Height:        Intake/Output Summary (Last 24 hours) at 05/28/2021 1551 Last data filed at 05/28/2021 1500 Gross per 24 hour  Intake 1160 ml  Output 975 ml  Net 185 ml   Filed Weights   05/26/21 1718 05/27/21 0418 05/28/21 0500  Weight: 42.7 kg 42.8 kg 41.8 kg    Examination:  General exam: Appears anxious Respiratory system: Diminished at the bases to auscultation. Respiratory effort normal. Cardiovascular system: S1 & S2 heard, RRR. No JVD, murmurs, rubs, gallops or clicks.  1+ pedal edema. Gastrointestinal system: Abdomen is nondistended, soft and nontender. No organomegaly or masses felt. Normal bowel sounds heard. Central nervous system: Alert and oriented. No focal neurological deficits. Extremities: 1+ pitting edema Skin: No rashes, lesions or ulcers Psychiatry: Judgement and insight appear normal. Mood & affect appropriate.     Data Reviewed: I have personally reviewed following labs and imaging studies  CBC: Recent Labs  Lab 05/26/21 1400 05/27/21 0406 05/28/21 0352  WBC 10.9* 12.4* 10.9*  NEUTROABS 9.7* 11.5* 9.5*  HGB 12.1 11.7* 11.3*  HCT 36.8 35.0* 34.0*  MCV 96.8 95.9 95.0  PLT 188 175 706   Basic Metabolic Panel: Recent Labs  Lab 05/26/21 1400 05/27/21 0406 05/28/21 0352  NA 138 137 141  K 3.3* 3.5 3.3*  CL 108 106 108  CO2 19* 24 22  GLUCOSE 347* 173* 98  BUN 24* 29* 46*  CREATININE 1.11* 1.08* 1.20*  CALCIUM 8.5* 8.6* 8.6*  MG  --  2.4 2.3  PHOS  --  2.1* 4.5   GFR: Estimated Creatinine Clearance: 20.6 mL/min (A) (by C-G formula based on SCr of 1.2 mg/dL (H)). Liver Function Tests: Recent Labs  Lab 05/26/21 1400 05/27/21 0406 05/28/21 0352  AST 35 26 25  ALT 32 23 24  ALKPHOS 51 42 39  BILITOT 1.0 0.8 0.9  PROT 7.5 6.3* 6.3*  ALBUMIN 3.9 3.3* 3.3*   No results for input(s): LIPASE, AMYLASE in the last 168 hours. No results for input(s): AMMONIA in the  last 168 hours. Coagulation Profile: No results for input(s): INR, PROTIME in the last 168 hours. Cardiac Enzymes: No results for input(s): CKTOTAL, CKMB, CKMBINDEX, TROPONINI in the last 168 hours. BNP (last 3 results) No results for input(s): PROBNP in the last 8760 hours. HbA1C: No results for input(s): HGBA1C in the last 72 hours. CBG: Recent Labs  Lab 05/27/21 2011 05/28/21 0020 05/28/21 0427 05/28/21 0744 05/28/21 1134  GLUCAP 112* 101* 86 103* 230*   Lipid Profile: No results for input(s): CHOL, HDL, LDLCALC, TRIG, CHOLHDL, LDLDIRECT in the last 72 hours. Thyroid Function Tests: No results for input(s): TSH, T4TOTAL, FREET4, T3FREE, THYROIDAB in the last 72 hours. Anemia Panel: Recent Labs    05/27/21 0406 05/28/21 0352  FERRITIN 155 180   Sepsis Labs: Recent Labs  Lab 05/26/21 1400 05/26/21 1553 05/26/21 1914  PROCALCITON  --   --  6.03  LATICACIDVEN 3.4* 2.4*  --     Recent Results (from the past 240 hour(s))  Resp Panel by RT-PCR (Flu A&B, Covid) Nasopharyngeal Swab     Status: Abnormal   Collection Time: 05/26/21  1:35 PM   Specimen: Nasopharyngeal Swab; Nasopharyngeal(NP) swabs  in vial transport medium  Result Value Ref Range Status   SARS Coronavirus 2 by RT PCR POSITIVE (A) NEGATIVE Final    Comment: CRITICAL RESULT CALLED TO, READ BACK BY AND VERIFIED WITH: KIERRA, RN @ 9702 ON 05/26/2021 BY LBROOKS, MLT (NOTE) SARS-CoV-2 target nucleic acids are DETECTED.  The SARS-CoV-2 RNA is generally detectable in upper respiratory specimens during the acute phase of infection. Positive results are indicative of the presence of the identified virus, but do not rule out bacterial infection or co-infection with other pathogens not detected by the test. Clinical correlation with patient history and other diagnostic information is necessary to determine patient infection status. The expected result is Negative.  Fact Sheet for  Patients: EntrepreneurPulse.com.au  Fact Sheet for Healthcare Providers: IncredibleEmployment.be  This test is not yet approved or cleared by the [place] FDA and  has been authorized for detection and/or diagnosis of SARS-CoV-2 by FDA under an Emergency Use Authorization (EUA).  This EUA will remain in effect (m eaning this test can be used) for the duration of  the COVID-19 declaration under Section 564(b)(1) of the Act, 21 U.S.C. section 360bbb-3(b)(1), unless the authorization is terminated or revoked sooner.     Influenza A by PCR NEGATIVE NEGATIVE Final   Influenza B by PCR NEGATIVE NEGATIVE Final    Comment: (NOTE) The Xpert Xpress SARS-CoV-2/FLU/RSV plus assay is intended as an aid in the diagnosis of influenza from Nasopharyngeal swab specimens and should not be used as a sole basis for treatment. Nasal washings and aspirates are unacceptable for Xpert Xpress SARS-CoV-2/FLU/RSV testing.  Fact Sheet for Patients: EntrepreneurPulse.com.au  Fact Sheet for Healthcare Providers: IncredibleEmployment.be  This test is not yet approved or cleared by the [place] FDA and has been authorized for detection and/or diagnosis of SARS-CoV-2 by FDA under an Emergency Use Authorization (EUA). This EUA will remain in effect (meaning this test can be used) for the duration of the COVID-19 declaration under Section 564(b)(1) of the Act, 21 U.S.C. section 360bbb-3(b)(1), unless the authorization is terminated or revoked.  Performed at [facility], [place] [address], [place], [place] [address]   Culture, blood (routine x 2) Call MD if unable to obtain prior to antibiotics being given     Status: None (Preliminary result)   Collection Time: 05/26/21  8:55 PM   Specimen: BLOOD  Result Value Ref Range Status   Specimen Description   Final    BLOOD BLOOD RIGHT ARM Performed at [facility] [address] [place], [place] [address]    Special Requests   Final    BOTTLES DRAWN AEROBIC ONLY Blood Culture results may not be optimal due to an inadequate volume of blood received in culture bottles Performed at [facility] [address] [place], [place] [address]    Culture   Final    NO GROWTH 1 DAY Performed at [facility], [place] [address], [place], [place] [address]    Report Status PENDING  Incomplete         Radiology Studies: ECHOCARDIOGRAM COMPLETE  Result Date: 05/27/2021    ECHOCARDIOGRAM REPORT   Patient Name:   Amy Wolf  Date of Exam: 05/27/2021 Medical Rec #:  [id number]  Height:       67.0 in Accession #:    [id number] Weight:       94.4 lb Date of Birth:  05/14/1928  BSA:  1.470 m Patient Age:    54 years   BP:           100/57 mmHg Patient Gender: F          HR:           82 bpm. Exam Location:  Inpatient Procedure: 2D Echo, Cardiac Doppler, Color Doppler and 3D Echo Indications:    CHF-Acute Systolic G26.94  History:        Patient has prior history of Echocardiogram examinations, most                 recent 04/12/2019. CHF, CAD, Prior CABG and Defibrillator, PAD,                 Arrythmias:Alternating bundle branch blocks.,                 Signs/Symptoms:Fever; Risk Factors:Diabetes, Dyslipidemia and                 Hypertension. Covid. Chronic kidney disease                 Tricuspid Valve Annular Ring 2007.                  Mitral Valve: prosthetic annuloplasty ring valve is present in                 the mitral position. Procedure Date: 2007.  Sonographer:    Darlina Sicilian RDCS Referring Phys: [id number] DAVID MANUEL [place]  1. Left ventricular ejection fraction, by estimation, is 30 to 35%. Left ventricular ejection fraction by 3D volume is 30 %. The left ventricle has moderately decreased function. The left ventricle demonstrates global hypokinesis. There is moderate asymmetric left ventricular  hypertrophy of the basal-septal segment. Left ventricular diastolic function could not be evaluated.  2. Right ventricular systolic function is mildly reduced. The right ventricular size is normal. There is moderately elevated pulmonary artery systolic pressure. The estimated right ventricular systolic pressure is 35.0 mmHg.  3. Left atrial size was moderately dilated.  4. The mitral valve has been repaired/replaced. Trivial mitral valve regurgitation. No evidence of mitral stenosis. The mean mitral valve gradient is 3.0 mmHg. There is a prosthetic annuloplasty ring present in the mitral position. Procedure Date: 2007.  5. The tricuspid valve is has been repaired/replaced. The tricuspid valve is status post repair with an annuloplasty ring. Tricuspid valve regurgitation is mild to moderate.  6. The aortic valve is tricuspid. Aortic valve regurgitation is not visualized.  7. The inferior vena cava is normal in size with <50% respiratory variability, suggesting right atrial pressure of 8 mmHg. Comparison(s): No significant change from prior study. 04/12/2019: LVEF 30%, global HK, RVSP 53 mmHg. FINDINGS  Left Ventricle: Left ventricular ejection fraction, by estimation, is 30 to 35%. Left ventricular ejection fraction by 3D volume is 30 %. The left ventricle has moderately decreased function. The left ventricle demonstrates global hypokinesis. The left ventricular internal cavity size was normal in size. There is moderate asymmetric left ventricular hypertrophy of the basal-septal segment. Left ventricular diastolic function could not be evaluated due to atrial fibrillation. Left ventricular diastolic function could not be evaluated. Right Ventricle: The right ventricular size is normal. No increase in right ventricular wall thickness. Right ventricular systolic function is mildly reduced. There is moderately elevated pulmonary artery systolic pressure. The tricuspid regurgitant velocity is 3.22 m/s, and with an assumed  right atrial pressure of 8 mmHg, the estimated right ventricular systolic pressure  is 49.5 mmHg. Left Atrium: Left atrial size was moderately dilated. Right Atrium: Right atrial size was normal in size. Pericardium: There is no evidence of pericardial effusion. Mitral Valve: The mitral valve has been repaired/replaced. Trivial mitral valve regurgitation. There is a prosthetic annuloplasty ring present in the mitral position. Procedure Date: 2007. No evidence of mitral valve stenosis. MV peak gradient, 7.6 mmHg.  The mean mitral valve gradient is 3.0 mmHg. Tricuspid Valve: The tricuspid valve is has been repaired/replaced. Tricuspid valve regurgitation is mild to moderate. The tricuspid valve is status post repair with an annuloplasty ring. Aortic Valve: The aortic valve is tricuspid. Aortic valve regurgitation is not visualized. Pulmonic Valve: The pulmonic valve was grossly normal. Pulmonic valve regurgitation is not visualized. Aorta: The aortic root and ascending aorta are structurally normal, with no evidence of dilitation. Venous: The inferior vena cava is normal in size with less than 50% respiratory variability, suggesting right atrial pressure of 8 mmHg. IAS/Shunts: No atrial level shunt detected by color flow Doppler. Additional Comments: A device lead is visualized.  LEFT VENTRICLE PLAX 2D LVIDd:         4.50 cm LVIDs:         3.80 cm LV PW:         0.90 cm         3D Volume EF LV IVS:        1.50 cm         LV 3D EF:    Left LVOT diam:     1.90 cm                      ventricul LV SV:         30                           ar LV SV Index:   20                           ejection LVOT Area:     2.84 cm                     fraction                                             by 3D                                             volume is                                             30 %.                                 3D Volume EF:                                3D EF:  30 %                                 LV EDV:       133 ml                                LV ESV:       94 ml                                LV SV:        39 ml RIGHT VENTRICLE RV S prime:     7.03 cm/s LEFT ATRIUM             Index        RIGHT ATRIUM           Index LA diam:        4.80 cm 3.27 cm/m   RA Area:     14.20 cm LA Vol (A2C):   47.3 ml 32.19 ml/m  RA Volume:   34.00 ml  23.14 ml/m LA Vol (A4C):   57.6 ml 39.20 ml/m LA Biplane Vol: 54.0 ml 36.75 ml/m  AORTIC VALVE LVOT Vmax:   62.00 cm/s LVOT Vmean:  39.233 cm/s LVOT VTI:    0.106 m  AORTA Ao Root diam: 3.00 cm Ao Asc diam:  2.90 cm MITRAL VALVE                TRICUSPID VALVE MV Area (PHT): 3.31 cm     TV Peak grad:   3.3 mmHg MV Area VTI:   1.09 cm     TV Mean grad:   1.7 mmHg MV Peak grad:  7.6 mmHg     TV Vmax:        0.90 m/s MV Mean grad:  3.0 mmHg     TV Vmean:       59.4 cm/s MV Vmax:       1.38 m/s     TV VTI:         0.20 msec MV Vmean:      81.0 cm/s    TR Peak grad:   41.5 mmHg MV Decel Time: 229 msec     TR Vmax:        322.00 cm/s MV E velocity: 104.37 cm/s                             SHUNTS                             Systemic VTI:  0.11 m                             Systemic Diam: 1.90 cm Lyman Bishop MD Electronically signed by Lyman Bishop MD Signature Date/Time: 05/27/2021/12:14:25 PM    Final         Scheduled Meds:  albuterol  2 puff Inhalation BID   apixaban  2.5 mg Oral BID   vitamin C  500 mg Oral Daily   brimonidine  1 drop Right Eye BID   feeding supplement  237 mL Oral BID BM   ferrous sulfate  325 mg  Oral Q breakfast   furosemide  20 mg Intravenous Daily   insulin aspart  0-15 Units Subcutaneous Q4H   insulin detemir  0.1 Units/kg Subcutaneous BID   latanoprost  1 drop Both Eyes QHS   linagliptin  5 mg Oral Daily   mouth rinse  15 mL Mouth Rinse BID   melatonin  3 mg Oral QHS   multivitamin with minerals  1 tablet Oral Daily   Netarsudil Dimesylate  1 drop Both Eyes QHS   potassium & sodium phosphates  1 packet Oral TID WC & HS    pravastatin  80 mg Oral Daily   [START ON 05/29/2021] predniSONE  50 mg Oral Daily   zinc sulfate  220 mg Oral Daily   Continuous Infusions:  levofloxacin (LEVAQUIN) IV     remdesivir 100 mg in NS 100 mL 100 mg (05/28/21 0828)     LOS: 1 day    Georgette Shell, MD 05/28/2021, 3:51 PM

## 2021-05-28 NOTE — TOC Progression Note (Signed)
Transition of Care [facility]) - Progression Note    Patient Details  Name: Amy Wolf MRN: [id number] Date of Birth: [birth year]  [facility] CM/SW Contact  Purcell Mouton, RN Phone Number: 05/28/2021, 3:51 PM  Clinical Narrative:     Pt plan to discharge to SNF. COVID+05/26/21. SNF will not take pt until 12/1, 11 days after COVID.   Expected Discharge Plan: [place] Barriers to Discharge: No Barriers Identified  Expected Discharge Plan and Services Expected Discharge Plan: [place] arrangements for the past 2 months: Single Family Home                                       Social Determinants of Health (SDOH) Interventions    Readmission Risk Interventions No flowsheet data found.

## 2021-05-28 NOTE — Discharge Instructions (Signed)

## 2021-05-28 NOTE — Progress Notes (Signed)
Initial Nutrition Assessment  DOCUMENTATION CODES:  Severe malnutrition in context of chronic illness, Underweight  INTERVENTION:  Add Ensure Plus High Protein po BID, each supplement provides 350 kcal and 20 grams of protein.   Add MVI with minerals daily.  Encourage PO and supplement intake.  NUTRITION DIAGNOSIS:  Severe Malnutrition related to chronic illness (CHF) as evidenced by severe fat depletion, severe muscle depletion, percent weight loss.  GOAL:  Patient will meet greater than or equal to 90% of their needs  MONITOR:  PO intake, Supplement acceptance, Labs, Weight trends, I & O's  REASON FOR ASSESSMENT:  Consult, Malnutrition Screening Tool Assessment of nutrition requirement/status  ASSESSMENT:  87 yo female with a PMH of permanent atrial fibrillation, pacemaker placement, coronary artery disease, hypertension, CAD/CABG, chronic systolic heart failure, stage IIIa CKD, type II DM, glaucoma, osteoarthritis, hyperlipidemia, migraine headaches, urinary incontinence who is coming with generalized weakness and dyspnea. Admitted with acute respiratory failure with hypoxia. Found to be COVID+.  Visited pt this morning. Pt eating breakfast and able to feed self well, working on pancakes. RD observed pt on target to finish meal to completion.  RD suspects patient may not be eating as well at home.  Per Epic, pt ate 100% of dinner last night.   Per Epic, pt has lost ~6 lbs (6.3%) in the last 3 weeks, which is significant and severe for the time frame.  Recommend supplementing intake with Ensure BID and MVI with minerals daily.  Attached "Suggestions for Increasing Calories and Protein" handout from the Academy of Nutrition and Dietetics to discharge summary.  Medications: reviewed; Vitamin C, ferrous sulfate, Lasix, SSI, Levemir, Tradjenta, Phos-Nak QID, prednisone, zinc sulfate  Labs: reviewed; K 3.3 (L), CBG 86-167 HbA1c: 6.6% (01/21/2021)  NUTRITION - FOCUSED PHYSICAL  EXAM: Flowsheet Row Most Recent Value  Orbital Region Severe depletion  Upper Arm Region Severe depletion  Thoracic and Lumbar Region Severe depletion  Buccal Region Severe depletion  Temple Region Severe depletion  Clavicle Bone Region Severe depletion  Clavicle and Acromion Bone Region Severe depletion  Scapular Bone Region Severe depletion  Dorsal Hand Moderate depletion  Patellar Region Severe depletion  Anterior Thigh Region Severe depletion  Posterior Calf Region Severe depletion  Edema (RD Assessment) None  Hair Reviewed  Eyes Reviewed  Mouth Reviewed  Skin Reviewed  Nails Reviewed   Diet Order:   Diet Order             Diet heart healthy/carb modified Room service appropriate? Yes; Fluid consistency: Thin  Diet effective now                  EDUCATION NEEDS:  No education needs have been identified at this time  Skin:  Skin Assessment: Reviewed RN Assessment  Last BM:  05/25/21  Height:  Ht Readings from Last 1 Encounters:  05/26/21 5\' 7"  (1.702 m)   Weight:  Wt Readings from Last 1 Encounters:  05/28/21 41.8 kg   BMI:  Body mass index is 14.43 kg/m.  Estimated Nutritional Needs:  Kcal:  1600-1800 Protein:  60-75 grams Fluid:  >1.6 L  Derrel Nip, RD, LDN (she/her/hers) Clinical Inpatient Dietitian RD Pager/After-Hours/Weekend Pager # in [place]

## 2021-05-29 ENCOUNTER — Other Ambulatory Visit: Payer: Self-pay | Admitting: Family Medicine

## 2021-05-29 DIAGNOSIS — E43 Unspecified severe protein-calorie malnutrition: Secondary | ICD-10-CM | POA: Insufficient documentation

## 2021-05-29 LAB — CBC WITH DIFFERENTIAL/PLATELET
Abs Immature Granulocytes: 0.07 10*3/uL (ref 0.00–0.07)
Basophils Absolute: 0 10*3/uL (ref 0.0–0.1)
Basophils Relative: 0 %
Eosinophils Absolute: 0 10*3/uL (ref 0.0–0.5)
Eosinophils Relative: 0 %
HCT: 32.4 % — ABNORMAL LOW (ref 36.0–46.0)
Hemoglobin: 10.8 g/dL — ABNORMAL LOW (ref 12.0–15.0)
Immature Granulocytes: 1 %
Lymphocytes Relative: 7 %
Lymphs Abs: 0.7 10*3/uL (ref 0.7–4.0)
MCH: 31.8 pg (ref 26.0–34.0)
MCHC: 33.3 g/dL (ref 30.0–36.0)
MCV: 95.3 fL (ref 80.0–100.0)
Monocytes Absolute: 0.7 10*3/uL (ref 0.1–1.0)
Monocytes Relative: 7 %
Neutro Abs: 8.3 10*3/uL — ABNORMAL HIGH (ref 1.7–7.7)
Neutrophils Relative %: 85 %
Platelets: 195 10*3/uL (ref 150–400)
RBC: 3.4 MIL/uL — ABNORMAL LOW (ref 3.87–5.11)
RDW: 13.4 % (ref 11.5–15.5)
WBC: 9.7 10*3/uL (ref 4.0–10.5)
nRBC: 0 % (ref 0.0–0.2)

## 2021-05-29 LAB — COMPREHENSIVE METABOLIC PANEL
ALT: 39 U/L (ref 0–44)
AST: 55 U/L — ABNORMAL HIGH (ref 15–41)
Albumin: 3 g/dL — ABNORMAL LOW (ref 3.5–5.0)
Alkaline Phosphatase: 38 U/L (ref 38–126)
Anion gap: 8 (ref 5–15)
BUN: 53 mg/dL — ABNORMAL HIGH (ref 8–23)
CO2: 23 mmol/L (ref 22–32)
Calcium: 8.5 mg/dL — ABNORMAL LOW (ref 8.9–10.3)
Chloride: 103 mmol/L (ref 98–111)
Creatinine, Ser: 0.96 mg/dL (ref 0.44–1.00)
GFR, Estimated: 56 mL/min — ABNORMAL LOW (ref >60)
Glucose, Bld: 130 mg/dL — ABNORMAL HIGH (ref 70–99)
Potassium: 3.3 mmol/L — ABNORMAL LOW (ref 3.5–5.1)
Sodium: 134 mmol/L — ABNORMAL LOW (ref 135–145)
Total Bilirubin: 0.8 mg/dL (ref 0.3–1.2)
Total Protein: 6.2 g/dL — ABNORMAL LOW (ref 6.5–8.1)

## 2021-05-29 LAB — GLUCOSE, CAPILLARY
Glucose-Capillary: 116 mg/dL — ABNORMAL HIGH (ref 70–99)
Glucose-Capillary: 138 mg/dL — ABNORMAL HIGH (ref 70–99)
Glucose-Capillary: 170 mg/dL — ABNORMAL HIGH (ref 70–99)
Glucose-Capillary: 172 mg/dL — ABNORMAL HIGH (ref 70–99)
Glucose-Capillary: 198 mg/dL — ABNORMAL HIGH (ref 70–99)
Glucose-Capillary: 245 mg/dL — ABNORMAL HIGH (ref 70–99)
Glucose-Capillary: 247 mg/dL — ABNORMAL HIGH (ref 70–99)
Glucose-Capillary: 267 mg/dL — ABNORMAL HIGH (ref 70–99)

## 2021-05-29 LAB — FERRITIN: Ferritin: 225 ng/mL (ref 11–307)

## 2021-05-29 LAB — MAGNESIUM: Magnesium: 2.2 mg/dL (ref 1.7–2.4)

## 2021-05-29 LAB — C-REACTIVE PROTEIN: CRP: 12.4 mg/dL — ABNORMAL HIGH (ref <1.0)

## 2021-05-29 LAB — D-DIMER, QUANTITATIVE: D-Dimer, Quant: 0.7 ug/mL-FEU — ABNORMAL HIGH (ref 0.00–0.50)

## 2021-05-29 LAB — PHOSPHORUS: Phosphorus: 3.4 mg/dL (ref 2.5–4.6)

## 2021-05-29 NOTE — Telephone Encounter (Signed)
Faxed, confirmation received 

## 2021-05-29 NOTE — TOC Progression Note (Signed)
Transition of Care [facility]) - Progression Note    Patient Details  Name: Amy Wolf MRN: [id number] Date of Birth: 1928/01/08  Transition of Care [facility]) CM/SW Contact  Leeroy Cha, RN Phone Number: 05/29/2021, 1:27 PM  Clinical Narrative:    [place] faxed out to area snf and [place] patient is from [facility].     Expected Discharge Plan: [place] Barriers to Discharge: No Barriers Identified  Expected Discharge Plan and Services Expected Discharge Plan: [place] arrangements for the past 2 months: Single Family Home                                       Social Determinants of Health (SDOH) Interventions    Readmission Risk Interventions No flowsheet data found.

## 2021-05-29 NOTE — NC FL2 (Signed)
[place] LEVEL OF CARE SCREENING TOOL     IDENTIFICATION  Patient Name: Amy Wolf Birthdate: Nov 12, 1927 Sex: female Admission Date (Current Location): 05/26/2021  [facility] and [place] Number:  Herbalist and Address:  [facility],  [place], [place]      Provider Number: [id number]  Attending Physician Name and Address:  Florencia Reasons, MD  Relative Name and Phone Number:       Current Level of Care: Hospital Recommended Level of Care: [place] Prior Approval Number:    Date Approved/Denied:   PASRR Number: [id number]  Discharge Plan: SNF    Current Diagnoses: Patient Active Problem List   Diagnosis Date Noted   Acute respiratory failure with hypoxia ([place]) 05/26/2021   Hypokalemia 05/26/2021   Acute respiratory disease due to COVID-19 virus 39/09/90   Chronic systolic CHF (congestive heart failure) ([place]) 05/26/2021   Persistent atrial fibrillation ([place]) 01/23/2020   Secondary hypercoagulable state ([place]) 01/23/2020   Chronic kidney disease (CKD), stage III (moderate) ([place]) 04/04/2019   Benign essential HTN 04/01/2019   Type 2 diabetes mellitus with diabetic chronic kidney disease ([place]) 04/01/2019   CAD (coronary artery disease) 04/01/2019   Hx of CABG 04/01/2019   Paroxysmal A-fib ([place]) 04/01/2019   Atherosclerosis of right carotid artery 04/01/2019   PVD (peripheral vascular disease) ([place]) 04/01/2019   AICD (automatic cardioverter/defibrillator) present 04/01/2019   Valvular heart disease 04/01/2019   CHF (congestive heart failure) ([place]) 04/01/2019   Glaucoma 04/01/2019    Orientation RESPIRATION BLADDER Height & Weight     Self, Time, Situation  Normal Continent Weight: 43.4 kg Height:  5\' 7"  (170.2 cm)  BEHAVIORAL SYMPTOMS/MOOD NEUROLOGICAL BOWEL NUTRITION STATUS      Continent Diet (regular)  AMBULATORY STATUS COMMUNICATION OF NEEDS Skin   Extensive Assist Verbally Normal                        Personal Care Assistance Level of Assistance  Bathing, Feeding, Dressing Bathing Assistance: Limited assistance Feeding assistance: Limited assistance Dressing Assistance: Limited assistance     Functional Limitations Info  Sight, Hearing, Speech Sight Info: Adequate Hearing Info: Adequate Speech Info: Adequate    SPECIAL CARE FACTORS FREQUENCY  PT (By licensed PT), OT (By licensed OT)     PT Frequency: 5 x weekly OT Frequency: 5 x weekly            Contractures Contractures Info: Not present    Additional Factors Info  Code Status Code Status Info: full             Current Medications (05/29/2021):  This is the current hospital active medication list Current Facility-Administered Medications  Medication Dose Route Frequency Provider Last Rate Last Admin   acetaminophen (TYLENOL) tablet 650 mg  650 mg Oral Q6H PRN Reubin Milan, MD       Or   acetaminophen (TYLENOL) suppository 650 mg  650 mg Rectal Q6H PRN Reubin Milan, MD       albuterol (VENTOLIN HFA) 108 (90 Base) MCG/ACT inhaler 2 puff  2 puff Inhalation BID Reubin Milan, MD   2 puff at 05/29/21 3300   apixaban (ELIQUIS) tablet 2.5 mg  2.5 mg Oral BID Reubin Milan, MD   2.5 mg at 05/29/21 7622   ascorbic acid (VITAMIN C) tablet 500 mg  500 mg Oral Daily Reubin Milan, MD   500 mg at 05/29/21 304 155 2852  brimonidine (ALPHAGAN) 0.15 % ophthalmic solution 1 drop  1 drop Right Eye BID Reubin Milan, MD   1 drop at 05/29/21 5449   chlorpheniramine-HYDROcodone (TUSSIONEX) 10-8 MG/5ML suspension 5 mL  5 mL Oral Q12H PRN Reubin Milan, MD       feeding supplement (ENSURE ENLIVE / ENSURE PLUS) liquid 237 mL  237 mL Oral BID BM Georgette Shell, MD   237 mL at 05/29/21 1049   ferrous sulfate tablet 325 mg  325 mg Oral Q breakfast Reubin Milan, MD   325 mg at 05/29/21 0841   furosemide (LASIX) injection 20 mg  20 mg Intravenous Daily Hosie Poisson, MD   20 mg  at 05/29/21 0841   guaiFENesin-dextromethorphan (ROBITUSSIN DM) 100-10 MG/5ML syrup 10 mL  10 mL Oral Q4H PRN Reubin Milan, MD   10 mL at 05/26/21 2116   insulin aspart (novoLOG) injection 0-15 Units  0-15 Units Subcutaneous Q4H Reubin Milan, MD   8 Units at 05/29/21 1131   insulin detemir (LEVEMIR) injection 4 Units  0.1 Units/kg Subcutaneous BID Reubin Milan, MD   4 Units at 05/29/21 1049   latanoprost (XALATAN) 0.005 % ophthalmic solution 1 drop  1 drop Both Eyes QHS Reubin Milan, MD   1 drop at 05/28/21 2106   levofloxacin (LEVAQUIN) IVPB 500 mg  500 mg Intravenous Q48H Emiliano Dyer, RPH 100 mL/hr at 05/28/21 2115 500 mg at 05/28/21 2115   linagliptin (TRADJENTA) tablet 5 mg  5 mg Oral Daily Reubin Milan, MD   5 mg at 05/29/21 0840   MEDLINE mouth rinse  15 mL Mouth Rinse BID Hosie Poisson, MD   15 mL at 05/29/21 1049   melatonin tablet 3 mg  3 mg Oral QHS Reubin Milan, MD   3 mg at 05/28/21 2107   multivitamin with minerals tablet 1 tablet  1 tablet Oral Daily Georgette Shell, MD   1 tablet at 05/29/21 0841   Netarsudil Dimesylate 0.02 % SOLN 1 drop  1 drop Both Eyes QHS Reubin Milan, MD       ondansetron [facility]) tablet 4 mg  4 mg Oral Q6H PRN Reubin Milan, MD       Or   ondansetron [facility]) injection 4 mg  4 mg Intravenous Q6H PRN Reubin Milan, MD       pravastatin (PRAVACHOL) tablet 80 mg  80 mg Oral Daily Reubin Milan, MD   80 mg at 05/29/21 0840   predniSONE (DELTASONE) tablet 50 mg  50 mg Oral Daily Reubin Milan, MD   50 mg at 05/29/21 0840   remdesivir 100 mg in sodium chloride 0.9 % 100 mL IVPB  100 mg Intravenous Daily Reubin Milan, MD 200 mL/hr at 05/29/21 0854 100 mg at 05/29/21 0854   zinc sulfate capsule 220 mg  220 mg Oral Daily Reubin Milan, MD   220 mg at 05/29/21 2010     Discharge Medications: Please see discharge summary for a list of discharge medications.  Relevant  Imaging Results:  Relevant Lab Results:   Additional Information OFH:[id number]  Leeroy Cha, RN

## 2021-05-29 NOTE — Plan of Care (Signed)
  Problem: Activity: Goal: Risk for activity intolerance will decrease Outcome: Progressing   Problem: Elimination: Goal: Will not experience complications related to bowel motility Outcome: Progressing   

## 2021-05-29 NOTE — Progress Notes (Signed)
PROGRESS NOTE    Amy Wolf  XLK:[id number] DOB: 1927-07-15 DOA: 05/26/2021 PCP: Fredirick Lathe, PA-C    Chief Complaint  Patient presents with   Shortness of Breath    Brief Narrative:  hypertension, HLD, noninsulin dependent diabetes, coronary artery disease s/p CABG with mitral and tricuspid annuloplasty rings at that time, ischemic cardiomyopathy with Medtronic ICD, paroxysmal atrial fibrillation, ckdIII, generalized weakness and dyspnea . She was febrile, and hypoxic. COVID 19 positive. CXR showed cardiomegaly with central pulm vessels prominence suggesting CHF. There were increased interstitial and alveolar markings in the perihilar regions and lower lung fields, more so on the right side suggesting asymmetric pulmonary edema or bilateral pneumonia.  Subjective:  She is on room air at rest now, wbc normalized, cr appears improved, crp trending down, slightly bump of ast  Assessment & Plan:   Principal Problem:   Acute respiratory failure with hypoxia (HCC) Active Problems:   Benign essential HTN   Type 2 diabetes mellitus with diabetic chronic kidney disease (HCC)   CAD (coronary artery disease)   Glaucoma   Persistent atrial fibrillation (HCC)   Hypokalemia   Acute respiratory disease due to COVID-19 virus   Chronic systolic CHF (congestive heart failure) (HCC)   Protein-calorie malnutrition, severe  Acute respiratory failure with hypoxia probably secondary to a combination of COVID-19 infection and acute CHF improving  Covid infection Currently on room air Getting remdesivir/steroids, also on levaquin,  crp appears trending down  Acute on chronic systolic chf with h/o  ischemic cardiomyopathy with a Medtronic ICD.  Echocardiogram 05/28/2021 with EF of 30 to 35%. Cxr with pulmonary edema Iv lasix /metoprolol Acei held , plan to resume once blood pressure and cr stable  Hypokalemia/hypophosphatemia  Phos normalized ,k remain low, continue to replace   CAD  s/p CABG, H/o  mitral and tricuspid annuloplasty rings at the time of her CABG.   On metoprolol/eliquis/statin    permanent atrial fibrillation, in chronic afib, on metoprolol /eliquis  Noninsulin dependent dm2 with hyperglycemia, likely steroids induced  On insulin currently  CKDIII a/anemia of chronic disease Hgb close to baseline  Cr fluctuating slightly, but appear improving   FTT, PT recommended snf, plan to d/c on 12/1 after 10day out of covid isolation  seen by palliative care confirmed full code. "She would accept artificial life support "for as long as it took" unless providers were certain that she would not recover, then she would not wish to continue artificial life support.  "   Nutritional Assessment: The patient's BMI is: Body mass index is 14.99 kg/m.Marland Kitchen Seen by dietician.  I agree with the assessment and plan as outlined below: Nutrition Status: Nutrition Problem: Severe Malnutrition Etiology: chronic illness (CHF) Signs/Symptoms: severe fat depletion, severe muscle depletion, percent weight loss Percent weight loss: 6.3 % Interventions: Ensure Enlive (each supplement provides 350kcal and 20 grams of protein), MVI  .     Unresulted Labs (From admission, onward)     Start     Ordered   05/27/21 0500  Phosphorus  Daily,   R      05/26/21 1851   05/27/21 0500  Magnesium  Daily,   R      05/26/21 1851   05/27/21 0500  Ferritin  Daily,   R      05/26/21 1851   05/27/21 0500  D-dimer, quantitative  Daily,   R      05/26/21 1851   05/27/21 0500  C-reactive protein  Daily,   R  05/26/21 1851   05/27/21 0500  Comprehensive metabolic panel  Daily,   R      05/26/21 1851   05/27/21 0500  CBC with Differential/Platelet  Daily,   R      05/26/21 1851   05/26/21 2024  Expectorated Sputum Assessment w Gram Stain, Rflx to Resp Cult  (Non-severe pneumonia (non-ICU care) in adult without resistant organism risk factors.)  Once,   R        05/26/21 2023   05/26/21  2023  Expectorated Sputum Assessment w Gram Stain, Rflx to Resp Cult  Once,   R        05/26/21 2023   05/26/21 2023  Culture, blood (routine x 2) Call MD if unable to obtain prior to antibiotics being given  BLOOD CULTURE X 2,   R (with TIMED occurrences)     Comments: If blood cultures drawn in Emergency Department - Do not draw and cancel order    05/26/21 2023              DVT prophylaxis: apixaban (ELIQUIS) tablet 2.5 mg Start: 05/26/21 2200 apixaban (ELIQUIS) tablet 2.5 mg   Code Status:full Family Communication: patient  Disposition:   Status is: Inpatient  Dispo: The patient is from: home               Anticipated d/c is to: SNF              Anticipated d/c date is: 12/1              Patient currently awaiting to be out of covid isolation   Consultants:  Palliative care  Procedures:  none  Antimicrobials:    Anti-infectives (From admission, onward)    Start     Dose/Rate Route Frequency Ordered Stop   05/28/21 2200  levofloxacin (LEVAQUIN) IVPB 500 mg        500 mg 100 mL/hr over 60 Minutes Intravenous Every 48 hours 05/26/21 2032 06/01/21 2159   05/27/21 1000  remdesivir 100 mg in sodium chloride 0.9 % 100 mL IVPB        100 mg 200 mL/hr over 30 Minutes Intravenous Daily 05/26/21 1507 05/31/21 0959   05/26/21 2200  levofloxacin (LEVAQUIN) IVPB 750 mg  Status:  Discontinued        750 mg 100 mL/hr over 90 Minutes Intravenous Every 24 hours 05/26/21 2023 05/26/21 2032   05/26/21 2200  levofloxacin (LEVAQUIN) IVPB 750 mg        750 mg 100 mL/hr over 90 Minutes Intravenous  Once 05/26/21 2032 05/26/21 2240   05/26/21 1530  remdesivir 100 mg in sodium chloride 0.9 % 100 mL IVPB        100 mg 200 mL/hr over 30 Minutes Intravenous Every 30 min 05/26/21 1507 05/26/21 1640          Objective: Vitals:   05/28/21 2122 05/29/21 0427 05/29/21 0500 05/29/21 1251  BP:  (!) 130/91  (!) 112/56  Pulse:  (!) 106  (!) 108  Resp:  16  16  Temp:  98.2 F (36.8  C)  98.2 F (36.8 C)  TempSrc:    Oral  SpO2: 98% 98%  100%  Weight:   43.4 kg   Height:        Intake/Output Summary (Last 24 hours) at 05/29/2021 1404 Last data filed at 05/29/2021 1030 Gross per 24 hour  Intake 639.93 ml  Output 550 ml  Net 89.93 ml   [employer]  05/27/21 0418 05/28/21 0500 05/29/21 0500  Weight: 42.8 kg 41.8 kg 43.4 kg    Examination:  General exam: alert, awake, communicative,calm, NAD Respiratory system: Clear to auscultation. Respiratory effort normal. Cardiovascular system:  RRR.  Gastrointestinal system: Abdomen is nondistended, soft and nontender.  Normal bowel sounds heard. Central nervous system: Alert and oriented. No focal neurological deficits. Extremities:  no edema Skin: No rashes, lesions or ulcers Psychiatry: Judgement and insight appear normal. Mood & affect appropriate.     Data Reviewed: I have personally reviewed following labs and imaging studies  CBC: Recent Labs  Lab 05/26/21 1400 05/27/21 0406 05/28/21 0352 05/29/21 0408  WBC 10.9* 12.4* 10.9* 9.7  NEUTROABS 9.7* 11.5* 9.5* 8.3*  HGB 12.1 11.7* 11.3* 10.8*  HCT 36.8 35.0* 34.0* 32.4*  MCV 96.8 95.9 95.0 95.3  PLT 188 175 185 494    Basic Metabolic Panel: Recent Labs  Lab 05/26/21 1400 05/27/21 0406 05/28/21 0352 05/29/21 0408  NA 138 137 141 134*  K 3.3* 3.5 3.3* 3.3*  CL 108 106 108 103  CO2 19* 24 22 23   GLUCOSE 347* 173* 98 130*  BUN 24* 29* 46* 53*  CREATININE 1.11* 1.08* 1.20* 0.96  CALCIUM 8.5* 8.6* 8.6* 8.5*  MG  --  2.4 2.3 2.2  PHOS  --  2.1* 4.5 3.4    GFR: Estimated Creatinine Clearance: 26.7 mL/min (by C-G formula based on SCr of 0.96 mg/dL).  Liver Function Tests: Recent Labs  Lab 05/26/21 1400 05/27/21 0406 05/28/21 0352 05/29/21 0408  AST 35 26 25 55*  ALT 32 23 24 39  ALKPHOS 51 42 39 38  BILITOT 1.0 0.8 0.9 0.8  PROT 7.5 6.3* 6.3* 6.2*  ALBUMIN 3.9 3.3* 3.3* 3.0*    CBG: Recent Labs  Lab 05/28/21 2004  05/29/21 0015 05/29/21 0424 05/29/21 0742 05/29/21 1130  GLUCAP 273* 245* 116* 138* 267*     Recent Results (from the past 240 hour(s))  Resp Panel by RT-PCR (Flu A&B, Covid) Nasopharyngeal Swab     Status: Abnormal   Collection Time: 05/26/21  1:35 PM   Specimen: Nasopharyngeal Swab; Nasopharyngeal(NP) swabs in vial transport medium  Result Value Ref Range Status   SARS Coronavirus 2 by RT PCR POSITIVE (A) NEGATIVE Final    Comment: CRITICAL RESULT CALLED TO, READ BACK BY AND VERIFIED WITH: KIERRA, RN @ [phone] ON 05/26/2021 BY LBROOKS, MLT (NOTE) SARS-CoV-2 target nucleic acids are DETECTED.  The SARS-CoV-2 RNA is generally detectable in upper respiratory specimens during the acute phase of infection. Positive results are indicative of the presence of the identified virus, but do not rule out bacterial infection or co-infection with other pathogens not detected by the test. Clinical correlation with patient history and other diagnostic information is necessary to determine patient infection status. The expected result is Negative.  Fact Sheet for Patients: EntrepreneurPulse.com.au  Fact Sheet for Healthcare Providers: IncredibleEmployment.be  This test is not yet approved or cleared by the [place] FDA and  has been authorized for detection and/or diagnosis of SARS-CoV-2 by FDA under an Emergency Use Authorization (EUA).  This EUA will remain in effect (m eaning this test can be used) for the duration of  the COVID-19 declaration under Section 564(b)(1) of the Act, 21 U.S.C. section 360bbb-3(b)(1), unless the authorization is terminated or revoked sooner.     Influenza A by PCR NEGATIVE NEGATIVE Final   Influenza B by PCR NEGATIVE NEGATIVE Final    Comment: (NOTE) The Xpert Xpress SARS-CoV-2/FLU/RSV plus  assay is intended as an aid in the diagnosis of influenza from Nasopharyngeal swab specimens and should not be used as a sole  basis for treatment. Nasal washings and aspirates are unacceptable for Xpert Xpress SARS-CoV-2/FLU/RSV testing.  Fact Sheet for Patients: EntrepreneurPulse.com.au  Fact Sheet for Healthcare Providers: IncredibleEmployment.be  This test is not yet approved or cleared by the [place] FDA and has been authorized for detection and/or diagnosis of SARS-CoV-2 by FDA under an Emergency Use Authorization (EUA). This EUA will remain in effect (meaning this test can be used) for the duration of the COVID-19 declaration under Section 564(b)(1) of the Act, 21 U.S.C. section 360bbb-3(b)(1), unless the authorization is terminated or revoked.  Performed at [facility], [place] [address], [place], [place] [address]   Culture, blood (routine x 2) Call MD if unable to obtain prior to antibiotics being given     Status: None (Preliminary result)   Collection Time: 05/26/21  8:55 PM   Specimen: BLOOD  Result Value Ref Range Status   Specimen Description   Final    BLOOD BLOOD RIGHT ARM Performed at [facility] [address], [place], [place] [address]    Special Requests   Final    BOTTLES DRAWN AEROBIC ONLY Blood Culture results may not be optimal due to an inadequate volume of blood received in culture bottles Performed at [facility] [address] [place], [place] [address]    Culture   Final    NO GROWTH 2 DAYS Performed at [facility] [address] [place], [place] [address]    Report Status PENDING  Incomplete         Radiology Studies: No results found.      Scheduled Meds:  albuterol  2 puff Inhalation BID   apixaban  2.5 mg Oral BID   vitamin C  500 mg Oral Daily   brimonidine  1 drop Right Eye BID   feeding supplement  237 mL Oral BID BM   ferrous sulfate  325 mg Oral Q breakfast   furosemide  20 mg Intravenous Daily   insulin aspart  0-15 Units Subcutaneous  Q4H   insulin detemir  0.1 Units/kg Subcutaneous BID   latanoprost  1 drop Both Eyes QHS   linagliptin  5 mg Oral Daily   mouth rinse  15 mL Mouth Rinse BID   melatonin  3 mg Oral QHS   multivitamin with minerals  1 tablet Oral Daily   Netarsudil Dimesylate  1 drop Both Eyes QHS   pravastatin  80 mg Oral Daily   predniSONE  50 mg Oral Daily   zinc sulfate  220 mg Oral Daily   Continuous Infusions:  levofloxacin (LEVAQUIN) IV 500 mg (05/28/21 2115)   remdesivir 100 mg in NS 100 mL 100 mg (05/29/21 0854)     LOS: 2 days   Time spent: 40mins Greater than 50% of this time was spent in counseling, explanation of diagnosis, planning of further management, and coordination of care.   Voice Recognition Viviann Spare dictation system was used to create this note, attempts have been made to correct errors. Please contact the author with questions and/or clarifications.   Florencia Reasons, MD PhD FACP [facility]  Available via Epic secure chat 7am-7pm for nonurgent issues Please page for urgent issues To page the attending provider between 7A-7P or the covering provider during after hours 7P-7A, please log into the web site [email or web address] and access using  universal [place] password for that web site. If you do not have the password, please call the hospital operator.    05/29/2021, 2:04 PM

## 2021-05-30 LAB — COMPREHENSIVE METABOLIC PANEL
ALT: 59 U/L — ABNORMAL HIGH (ref 0–44)
AST: 83 U/L — ABNORMAL HIGH (ref 15–41)
Albumin: 2.8 g/dL — ABNORMAL LOW (ref 3.5–5.0)
Alkaline Phosphatase: 41 U/L (ref 38–126)
Anion gap: 9 (ref 5–15)
BUN: 42 mg/dL — ABNORMAL HIGH (ref 8–23)
CO2: 23 mmol/L (ref 22–32)
Calcium: 8.2 mg/dL — ABNORMAL LOW (ref 8.9–10.3)
Chloride: 101 mmol/L (ref 98–111)
Creatinine, Ser: 0.86 mg/dL (ref 0.44–1.00)
GFR, Estimated: >60 mL/min (ref >60)
Glucose, Bld: 89 mg/dL (ref 70–99)
Potassium: 3.2 mmol/L — ABNORMAL LOW (ref 3.5–5.1)
Sodium: 133 mmol/L — ABNORMAL LOW (ref 135–145)
Total Bilirubin: 0.8 mg/dL (ref 0.3–1.2)
Total Protein: 5.8 g/dL — ABNORMAL LOW (ref 6.5–8.1)

## 2021-05-30 LAB — CBC WITH DIFFERENTIAL/PLATELET
Abs Immature Granulocytes: 0.05 10*3/uL (ref 0.00–0.07)
Basophils Absolute: 0 10*3/uL (ref 0.0–0.1)
Basophils Relative: 0 %
Eosinophils Absolute: 0 10*3/uL (ref 0.0–0.5)
Eosinophils Relative: 0 %
HCT: 34.7 % — ABNORMAL LOW (ref 36.0–46.0)
Hemoglobin: 11.7 g/dL — ABNORMAL LOW (ref 12.0–15.0)
Immature Granulocytes: 0 %
Lymphocytes Relative: 9 %
Lymphs Abs: 1 10*3/uL (ref 0.7–4.0)
MCH: 32.1 pg (ref 26.0–34.0)
MCHC: 33.7 g/dL (ref 30.0–36.0)
MCV: 95.1 fL (ref 80.0–100.0)
Monocytes Absolute: 0.9 10*3/uL (ref 0.1–1.0)
Monocytes Relative: 8 %
Neutro Abs: 9.8 10*3/uL — ABNORMAL HIGH (ref 1.7–7.7)
Neutrophils Relative %: 83 %
Platelets: 193 10*3/uL (ref 150–400)
RBC: 3.65 MIL/uL — ABNORMAL LOW (ref 3.87–5.11)
RDW: 13.2 % (ref 11.5–15.5)
WBC: 11.8 10*3/uL — ABNORMAL HIGH (ref 4.0–10.5)
nRBC: 0 % (ref 0.0–0.2)

## 2021-05-30 LAB — PHOSPHORUS: Phosphorus: 2.6 mg/dL (ref 2.5–4.6)

## 2021-05-30 LAB — GLUCOSE, CAPILLARY
Glucose-Capillary: 173 mg/dL — ABNORMAL HIGH (ref 70–99)
Glucose-Capillary: 256 mg/dL — ABNORMAL HIGH (ref 70–99)
Glucose-Capillary: 289 mg/dL — ABNORMAL HIGH (ref 70–99)
Glucose-Capillary: 303 mg/dL — ABNORMAL HIGH (ref 70–99)
Glucose-Capillary: 360 mg/dL — ABNORMAL HIGH (ref 70–99)
Glucose-Capillary: 86 mg/dL (ref 70–99)
Glucose-Capillary: 90 mg/dL (ref 70–99)

## 2021-05-30 LAB — C-REACTIVE PROTEIN: CRP: 7.6 mg/dL — ABNORMAL HIGH (ref <1.0)

## 2021-05-30 LAB — FERRITIN: Ferritin: 240 ng/mL (ref 11–307)

## 2021-05-30 LAB — MAGNESIUM: Magnesium: 2.2 mg/dL (ref 1.7–2.4)

## 2021-05-30 LAB — D-DIMER, QUANTITATIVE: D-Dimer, Quant: 0.61 ug/mL-FEU — ABNORMAL HIGH (ref 0.00–0.50)

## 2021-05-30 MED ORDER — POTASSIUM CHLORIDE 20 MEQ PO PACK
20.0000 meq | PACK | Freq: Two times a day (BID) | ORAL | Status: DC
  Administered 2021-05-30 – 2021-05-31 (×2): 20 meq via ORAL
  Filled 2021-05-30 (×2): qty 1

## 2021-05-30 NOTE — Progress Notes (Signed)
PROGRESS NOTE    Amy Wolf  TLX:[id number] DOB: 06-05-1928 DOA: 05/26/2021 PCP: Fredirick Lathe, PA-C    Chief Complaint  Patient presents with   Shortness of Breath    Brief Narrative:  hypertension, HLD, noninsulin dependent diabetes, coronary artery disease s/p CABG with mitral and tricuspid annuloplasty rings at that time, ischemic cardiomyopathy with Medtronic ICD, paroxysmal atrial fibrillation, ckdIII, generalized weakness and dyspnea . She was febrile, and hypoxic. COVID 19 positive. CXR showed cardiomegaly with central pulm vessels prominence suggesting CHF. There were increased interstitial and alveolar markings in the perihilar regions and lower lung fields, more so on the right side suggesting asymmetric pulmonary edema or bilateral pneumonia.  Subjective:  She is on room air at rest now, wbc normalized, cr appears improved, crp trending down, slightly bump of ast She denies discomfort, no acute interval changes   Assessment & Plan:   Principal Problem:   Acute respiratory failure with hypoxia (HCC) Active Problems:   Benign essential HTN   Type 2 diabetes mellitus with diabetic chronic kidney disease (HCC)   CAD (coronary artery disease)   Glaucoma   Persistent atrial fibrillation (HCC)   Hypokalemia   Acute respiratory disease due to COVID-19 virus   Chronic systolic CHF (congestive heart failure) (HCC)   Protein-calorie malnutrition, severe  Acute respiratory failure with hypoxia probably secondary to a combination of COVID-19 infection and acute CHF Improving,  Covid infection Currently on room air Finished remdesivir  also on levaquin,  crp appears trending down Plan to taper steroids soon  Acute on chronic systolic chf with h/o  ischemic cardiomyopathy with a Medtronic ICD.  Echocardiogram 05/28/2021 with EF of 30 to 35%. Cxr with pulmonary edema Iv lasix /metoprolol Acei held , plan to resume once blood pressure and cr  stable  Hypokalemia/hypophosphatemia  Phos normalized ,k remain low, continue to replace   CAD s/p CABG, H/o  mitral and tricuspid annuloplasty rings at the time of her CABG.   On metoprolol/eliquis/statin    permanent atrial fibrillation, in chronic afib, on metoprolol /eliquis  Noninsulin dependent dm2 with hyperglycemia, likely steroids induced  On insulin currently  CKDIII a/anemia of chronic disease Hgb close to baseline  Cr fluctuating slightly, but appear improving   FTT, PT recommended snf, plan to d/c on 12/1 after 10day out of covid isolation  seen by palliative care confirmed full code. "She would accept artificial life support "for as long as it took" unless providers were certain that she would not recover, then she would not wish to continue artificial life support.  "   Nutritional Assessment: The patient's BMI is: Body mass index is 15.05 kg/m.Marland Kitchen Seen by dietician.  I agree with the assessment and plan as outlined below: Nutrition Status: Nutrition Problem: Severe Malnutrition Etiology: chronic illness (CHF) Signs/Symptoms: severe fat depletion, severe muscle depletion, percent weight loss Percent weight loss: 6.3 % Interventions: Ensure Enlive (each supplement provides 350kcal and 20 grams of protein), MVI  .     Unresulted Labs (From admission, onward)     Start     Ordered   05/27/21 0500  Phosphorus  Daily,   R      05/26/21 1851   05/27/21 0500  Magnesium  Daily,   R      05/26/21 1851   05/27/21 0500  Ferritin  Daily,   R      05/26/21 1851   05/27/21 0500  D-dimer, quantitative  Daily,   R  05/26/21 1851   05/27/21 0500  C-reactive protein  Daily,   R      05/26/21 1851   05/27/21 0500  Comprehensive metabolic panel  Daily,   R      05/26/21 1851   05/27/21 0500  CBC with Differential/Platelet  Daily,   R      05/26/21 1851   05/26/21 2024  Expectorated Sputum Assessment w Gram Stain, Rflx to Resp Cult  (Non-severe pneumonia (non-ICU  care) in adult without resistant organism risk factors.)  Once,   R        05/26/21 2023   05/26/21 2023  Expectorated Sputum Assessment w Gram Stain, Rflx to [place]  Once,   R        05/26/21 2023   05/26/21 2023  Culture, blood (routine x 2) Call MD if unable to obtain prior to antibiotics being given  BLOOD CULTURE X 2,   R (with TIMED occurrences)     Comments: If blood cultures drawn in Emergency Department - Do not draw and cancel order    05/26/21 2023              DVT prophylaxis: apixaban (ELIQUIS) tablet 2.5 mg Start: 05/26/21 2200 apixaban (ELIQUIS) tablet 2.5 mg   Code Status:full Family Communication: patient  Disposition:   Status is: Inpatient  Dispo: The patient is from: home               Anticipated d/c is to: SNF              Anticipated d/c date is: 12/1              Patient currently awaiting to be out of covid isolation   Consultants:  Palliative care  Procedures:  none  Antimicrobials:    Anti-infectives (From admission, onward)    Start     Dose/Rate Route Frequency Ordered Stop   05/28/21 2200  levofloxacin (LEVAQUIN) IVPB 500 mg        500 mg 100 mL/hr over 60 Minutes Intravenous Every 48 hours 05/26/21 2032 06/01/21 2159   05/27/21 1000  remdesivir 100 mg in sodium chloride 0.9 % 100 mL IVPB        100 mg 200 mL/hr over 30 Minutes Intravenous Daily 05/26/21 1507 05/30/21 1050   05/26/21 2200  levofloxacin (LEVAQUIN) IVPB 750 mg  Status:  Discontinued        750 mg 100 mL/hr over 90 Minutes Intravenous Every 24 hours 05/26/21 2023 05/26/21 2032   05/26/21 2200  levofloxacin (LEVAQUIN) IVPB 750 mg        750 mg 100 mL/hr over 90 Minutes Intravenous  Once 05/26/21 2032 05/26/21 2240   05/26/21 1530  remdesivir 100 mg in sodium chloride 0.9 % 100 mL IVPB        100 mg 200 mL/hr over 30 Minutes Intravenous Every 30 min 05/26/21 1507 05/26/21 1640          Objective: Vitals:   05/30/21 0358 05/30/21 0500 05/30/21 0743 05/30/21  1507  BP: 134/89  138/89 104/62  Pulse: 94  (!) 107 (!) 105  Resp: 15  17 17   Temp: 98.5 F (36.9 C)  (!) 97.4 F (36.3 C) 98.2 F (36.8 C)  TempSrc: Oral  Oral   SpO2: 99%  99% 98%  Weight:  43.6 kg    Height:        Intake/Output Summary (Last 24 hours) at 05/30/2021 1950 Last data filed at 05/30/2021  1800 Gross per 24 hour  Intake 542 ml  Output 700 ml  Net -158 ml   Filed Weights   05/28/21 0500 05/29/21 0500 05/30/21 0500  Weight: 41.8 kg 43.4 kg 43.6 kg    Examination:  General exam: alert, awake, communicative,calm, NAD Respiratory system: Clear to auscultation. Respiratory effort normal. Cardiovascular system:  RRR.  Gastrointestinal system: Abdomen is nondistended, soft and nontender.  Normal bowel sounds heard. Central nervous system: Alert and oriented. No focal neurological deficits. Extremities:  no edema Skin: No rashes, lesions or ulcers Psychiatry: Judgement and insight appear normal. Mood & affect appropriate.     Data Reviewed: I have personally reviewed following labs and imaging studies  CBC: Recent Labs  Lab 05/26/21 1400 05/27/21 0406 05/28/21 0352 05/29/21 0408 05/30/21 0424  WBC 10.9* 12.4* 10.9* 9.7 11.8*  NEUTROABS 9.7* 11.5* 9.5* 8.3* 9.8*  HGB 12.1 11.7* 11.3* 10.8* 11.7*  HCT 36.8 35.0* 34.0* 32.4* 34.7*  MCV 96.8 95.9 95.0 95.3 95.1  PLT 188 175 185 195 735    Basic Metabolic Panel: Recent Labs  Lab 05/26/21 1400 05/27/21 0406 05/28/21 0352 05/29/21 0408 05/30/21 0424  NA 138 137 141 134* 133*  K 3.3* 3.5 3.3* 3.3* 3.2*  CL 108 106 108 103 101  CO2 19* 24 22 23 23   GLUCOSE 347* 173* 98 130* 89  BUN 24* 29* 46* 53* 42*  CREATININE 1.11* 1.08* 1.20* 0.96 0.86  CALCIUM 8.5* 8.6* 8.6* 8.5* 8.2*  MG  --  2.4 2.3 2.2 2.2  PHOS  --  2.1* 4.5 3.4 2.6    GFR: Estimated Creatinine Clearance: 29.9 mL/min (by C-G formula based on SCr of 0.86 mg/dL).  Liver Function Tests: Recent Labs  Lab 05/26/21 1400 05/27/21 0406  05/28/21 0352 05/29/21 0408 05/30/21 0424  AST 35 26 25 55* 83*  ALT 32 23 24 39 59*  ALKPHOS 51 42 39 38 41  BILITOT 1.0 0.8 0.9 0.8 0.8  PROT 7.5 6.3* 6.3* 6.2* 5.8*  ALBUMIN 3.9 3.3* 3.3* 3.0* 2.8*    CBG: Recent Labs  Lab 05/29/21 2355 05/30/21 0356 05/30/21 0740 05/30/21 1215 05/30/21 1642  GLUCAP 172* 90 86 173* 303*     Recent Results (from the past 240 hour(s))  Resp Panel by RT-PCR (Flu A&B, Covid) Nasopharyngeal Swab     Status: Abnormal   Collection Time: 05/26/21  1:35 PM   Specimen: Nasopharyngeal Swab; Nasopharyngeal(NP) swabs in vial transport medium  Result Value Ref Range Status   SARS Coronavirus 2 by RT PCR POSITIVE (A) NEGATIVE Final    Comment: CRITICAL RESULT CALLED TO, READ BACK BY AND VERIFIED WITH: KIERRA, RN @ 3299 ON 05/26/2021 BY LBROOKS, MLT (NOTE) SARS-CoV-2 target nucleic acids are DETECTED.  The SARS-CoV-2 RNA is generally detectable in upper respiratory specimens during the acute phase of infection. Positive results are indicative of the presence of the identified virus, but do not rule out bacterial infection or co-infection with other pathogens not detected by the test. Clinical correlation with patient history and other diagnostic information is necessary to determine patient infection status. The expected result is Negative.  Fact Sheet for Patients: EntrepreneurPulse.com.au  Fact Sheet for Healthcare Providers: IncredibleEmployment.be  This test is not yet approved or cleared by the [place] FDA and  has been authorized for detection and/or diagnosis of SARS-CoV-2 by FDA under an Emergency Use Authorization (EUA).  This EUA will remain in effect (m eaning this test can be used) for the duration of  the COVID-19 declaration under Section 564(b)(1) of the Act, 21 U.S.C. section 360bbb-3(b)(1), unless the authorization is terminated or revoked sooner.     Influenza A by PCR NEGATIVE  NEGATIVE Final   Influenza B by PCR NEGATIVE NEGATIVE Final    Comment: (NOTE) The Xpert Xpress SARS-CoV-2/FLU/RSV plus assay is intended as an aid in the diagnosis of influenza from Nasopharyngeal swab specimens and should not be used as a sole basis for treatment. Nasal washings and aspirates are unacceptable for Xpert Xpress SARS-CoV-2/FLU/RSV testing.  Fact Sheet for Patients: EntrepreneurPulse.com.au  Fact Sheet for Healthcare Providers: IncredibleEmployment.be  This test is not yet approved or cleared by the [place] FDA and has been authorized for detection and/or diagnosis of SARS-CoV-2 by FDA under an Emergency Use Authorization (EUA). This EUA will remain in effect (meaning this test can be used) for the duration of the COVID-19 declaration under Section 564(b)(1) of the Act, 21 U.S.C. section 360bbb-3(b)(1), unless the authorization is terminated or revoked.  Performed at [facility], [place] [address], [place], [place] [address]   Culture, blood (routine x 2) Call MD if unable to obtain prior to antibiotics being given     Status: None (Preliminary result)   Collection Time: 05/26/21  8:55 PM   Specimen: BLOOD  Result Value Ref Range Status   Specimen Description   Final    BLOOD BLOOD RIGHT ARM Performed at [facility] [address] [place], [place] [address]    Special Requests   Final    BOTTLES DRAWN AEROBIC ONLY Blood Culture results may not be optimal due to an inadequate volume of blood received in culture bottles Performed at [facility] [address] [place], [place] [address]    Culture   Final    NO GROWTH 3 DAYS Performed at [facility], [place] [address] [place], [place] [address]    Report Status PENDING  Incomplete         Radiology Studies: No results found.      Scheduled Meds:  albuterol  2 puff Inhalation BID   apixaban   2.5 mg Oral BID   vitamin C  500 mg Oral Daily   brimonidine  1 drop Right Eye BID   feeding supplement  237 mL Oral BID BM   ferrous sulfate  325 mg Oral Q breakfast   furosemide  20 mg Intravenous Daily   insulin aspart  0-15 Units Subcutaneous Q4H   insulin detemir  0.1 Units/kg Subcutaneous BID   latanoprost  1 drop Both Eyes QHS   linagliptin  5 mg Oral Daily   mouth rinse  15 mL Mouth Rinse BID   melatonin  3 mg Oral QHS   multivitamin with minerals  1 tablet Oral Daily   Netarsudil Dimesylate  1 drop Both Eyes QHS   potassium chloride  20 mEq Oral BID   pravastatin  80 mg Oral Daily   predniSONE  50 mg Oral Daily   zinc sulfate  220 mg Oral Daily   Continuous Infusions:  levofloxacin (LEVAQUIN) IV 500 mg (05/28/21 2115)     LOS: 3 days   Time spent: 63mins Greater than 50% of this time was spent in counseling, explanation of diagnosis, planning of further management, and coordination of care.   Voice Recognition Viviann Spare dictation system was used to create this note, attempts have been made to correct errors. Please contact the author with questions and/or clarifications.  Florencia Reasons, MD PhD FACP [facility]  Available via Epic secure chat 7am-7pm for nonurgent issues Please page for urgent issues To page the attending provider between 7A-7P or the covering provider during after hours 7P-7A, please log into the web site [email or web address] and access using universal [place] password for that web site. If you do not have the password, please call the hospital operator.    05/30/2021, 7:50 PM

## 2021-05-31 LAB — COMPREHENSIVE METABOLIC PANEL
ALT: 63 U/L — ABNORMAL HIGH (ref 0–44)
AST: 60 U/L — ABNORMAL HIGH (ref 15–41)
Albumin: 2.9 g/dL — ABNORMAL LOW (ref 3.5–5.0)
Alkaline Phosphatase: 41 U/L (ref 38–126)
Anion gap: 5 (ref 5–15)
BUN: 43 mg/dL — ABNORMAL HIGH (ref 8–23)
CO2: 26 mmol/L (ref 22–32)
Calcium: 8.5 mg/dL — ABNORMAL LOW (ref 8.9–10.3)
Chloride: 100 mmol/L (ref 98–111)
Creatinine, Ser: 0.93 mg/dL (ref 0.44–1.00)
GFR, Estimated: 58 mL/min — ABNORMAL LOW (ref >60)
Glucose, Bld: 97 mg/dL (ref 70–99)
Potassium: 4 mmol/L (ref 3.5–5.1)
Sodium: 131 mmol/L — ABNORMAL LOW (ref 135–145)
Total Bilirubin: 1 mg/dL (ref 0.3–1.2)
Total Protein: 5.8 g/dL — ABNORMAL LOW (ref 6.5–8.1)

## 2021-05-31 LAB — CBC WITH DIFFERENTIAL/PLATELET
Abs Immature Granulocytes: 0.08 10*3/uL — ABNORMAL HIGH (ref 0.00–0.07)
Basophils Absolute: 0 10*3/uL (ref 0.0–0.1)
Basophils Relative: 0 %
Eosinophils Absolute: 0 10*3/uL (ref 0.0–0.5)
Eosinophils Relative: 0 %
HCT: 33.6 % — ABNORMAL LOW (ref 36.0–46.0)
Hemoglobin: 11.5 g/dL — ABNORMAL LOW (ref 12.0–15.0)
Immature Granulocytes: 1 %
Lymphocytes Relative: 9 %
Lymphs Abs: 1 10*3/uL (ref 0.7–4.0)
MCH: 32 pg (ref 26.0–34.0)
MCHC: 34.2 g/dL (ref 30.0–36.0)
MCV: 93.6 fL (ref 80.0–100.0)
Monocytes Absolute: 1.2 10*3/uL — ABNORMAL HIGH (ref 0.1–1.0)
Monocytes Relative: 11 %
Neutro Abs: 8.7 10*3/uL — ABNORMAL HIGH (ref 1.7–7.7)
Neutrophils Relative %: 79 %
Platelets: 211 10*3/uL (ref 150–400)
RBC: 3.59 MIL/uL — ABNORMAL LOW (ref 3.87–5.11)
RDW: 12.7 % (ref 11.5–15.5)
WBC: 11 10*3/uL — ABNORMAL HIGH (ref 4.0–10.5)
nRBC: 0 % (ref 0.0–0.2)

## 2021-05-31 LAB — PHOSPHORUS: Phosphorus: 2 mg/dL — ABNORMAL LOW (ref 2.5–4.6)

## 2021-05-31 LAB — FERRITIN: Ferritin: 210 ng/mL (ref 11–307)

## 2021-05-31 LAB — GLUCOSE, CAPILLARY
Glucose-Capillary: 103 mg/dL — ABNORMAL HIGH (ref 70–99)
Glucose-Capillary: 104 mg/dL — ABNORMAL HIGH (ref 70–99)
Glucose-Capillary: 249 mg/dL — ABNORMAL HIGH (ref 70–99)
Glucose-Capillary: 256 mg/dL — ABNORMAL HIGH (ref 70–99)
Glucose-Capillary: 387 mg/dL — ABNORMAL HIGH (ref 70–99)
Glucose-Capillary: 93 mg/dL (ref 70–99)

## 2021-05-31 LAB — D-DIMER, QUANTITATIVE: D-Dimer, Quant: 0.73 ug/mL-FEU — ABNORMAL HIGH (ref 0.00–0.50)

## 2021-05-31 LAB — MAGNESIUM: Magnesium: 2.3 mg/dL (ref 1.7–2.4)

## 2021-05-31 LAB — C-REACTIVE PROTEIN: CRP: 4.6 mg/dL — ABNORMAL HIGH (ref <1.0)

## 2021-05-31 MED ORDER — INSULIN ASPART 100 UNIT/ML IJ SOLN
2.0000 [IU] | Freq: Three times a day (TID) | INTRAMUSCULAR | Status: DC
  Administered 2021-06-01 – 2021-06-06 (×16): 2 [IU] via SUBCUTANEOUS

## 2021-05-31 MED ORDER — PREDNISONE 20 MG PO TABS
40.0000 mg | ORAL_TABLET | Freq: Every day | ORAL | Status: DC
  Administered 2021-06-01: 40 mg via ORAL
  Filled 2021-05-31: qty 2

## 2021-05-31 MED ORDER — FUROSEMIDE 20 MG PO TABS: 20.0000 mg | ORAL_TABLET | ORAL | Status: DC

## 2021-05-31 MED ORDER — K PHOS MONO-SOD PHOS DI & MONO 155-852-130 MG PO TABS
250.0000 mg | ORAL_TABLET | Freq: Every day | ORAL | Status: DC
  Administered 2021-06-01: 250 mg via ORAL
  Filled 2021-05-31 (×2): qty 1

## 2021-05-31 NOTE — Progress Notes (Signed)
PROGRESS NOTE    Amy Wolf  OVF:[id number] DOB: [birth year] DOA: 05/26/2021 PCP: Fredirick Lathe, PA-C    Chief Complaint  Patient presents with   Shortness of Breath    Brief Narrative:  hypertension, HLD, noninsulin dependent diabetes, coronary artery disease s/p CABG with mitral and tricuspid annuloplasty rings at that time, ischemic cardiomyopathy with Medtronic ICD, paroxysmal atrial fibrillation, ckdIII, generalized weakness and dyspnea . She was febrile, and hypoxic. COVID 19 positive. CXR showed cardiomegaly with central pulm vessels prominence suggesting CHF. There were increased interstitial and alveolar markings in the perihilar regions and lower lung fields, more so on the right side suggesting asymmetric pulmonary edema or bilateral pneumonia.  Subjective:  She is on room air at rest now, wbc normalized, cr appears improved, crp trending down, slightly bump of ast which is trending down as well She appears  very weak but denies discomfort, no acute interval changes   Assessment & Plan:   Principal Problem:   Acute respiratory failure with hypoxia (HCC) Active Problems:   Benign essential HTN   Type 2 diabetes mellitus with diabetic chronic kidney disease (HCC)   CAD (coronary artery disease)   Glaucoma   Persistent atrial fibrillation (HCC)   Hypokalemia   Acute respiratory disease due to COVID-19 virus   Chronic systolic CHF (congestive heart failure) (HCC)   Protein-calorie malnutrition, severe  Acute respiratory failure with hypoxia probably secondary to a combination of COVID-19 infection and acute CHF Improving,  Covid infection Currently on room air Finished remdesivir  finished levaquin,   crp appears trending down Plan to start taper steroids  Acute on chronic systolic chf with h/o  ischemic cardiomyopathy with a Medtronic ICD.  Echocardiogram 05/28/2021 with EF of 30 to 35%. Cxr with pulmonary edema on presentation Received Iv lasix, now appear  euvolemic, she does not appear to be on any lasix at home, will change to oral lasix q48hrs, monitor volume status  Continue home meds metoprolol Acei held , plan to resume once blood pressure and cr stable  Hypokalemia/hypophosphatemia  Replace prn Continue monitor   CAD s/p CABG, H/o  mitral and tricuspid annuloplasty rings at the time of her CABG.   On metoprolol/eliquis/statin    permanent atrial fibrillation, in chronic afib, on metoprolol /eliquis  Noninsulin dependent dm2 with hyperglycemia, likely steroids induced  On insulin currently Taper steroids  CKDIII a/anemia of chronic disease Hgb close to baseline  Cr fluctuating slightly, but appear improving   FTT, PT recommended snf, plan to d/c on 12/1 after 10day out of covid isolation  seen by palliative care confirmed full code. "She would accept artificial life support "for as long as it took" unless providers were certain that she would not recover, then she would not wish to continue artificial life support.  "   Nutritional Assessment: The patient's BMI is: Body mass index is 14.05 kg/m.Marland Kitchen Seen by dietician.  I agree with the assessment and plan as outlined below: Nutrition Status: Nutrition Problem: Severe Malnutrition Etiology: chronic illness (CHF) Signs/Symptoms: severe fat depletion, severe muscle depletion, percent weight loss Percent weight loss: 6.3 % Interventions: Ensure Enlive (each supplement provides 350kcal and 20 grams of protein), MVI  .     Unresulted Labs (From admission, onward)     Start     Ordered   05/26/21 2024  Expectorated Sputum Assessment w Gram Stain, Rflx to Resp Cult  (Non-severe pneumonia (non-ICU care) in adult without resistant organism risk factors.)  Once,   R  05/26/21 2023   05/26/21 2023  Expectorated Sputum Assessment w Gram Stain, Rflx to Resp Cult  Once,   R        05/26/21 2023              DVT prophylaxis: apixaban (ELIQUIS) tablet 2.5 mg Start:  05/26/21 2200 apixaban (ELIQUIS) tablet 2.5 mg   Code Status:full Family Communication: patient , she declined my offer to give update to her family, she states " I talked to them over the phone" Disposition:   Status is: Inpatient  Dispo: The patient is from: home               Anticipated d/c is to: SNF              Anticipated d/c date is: 12/1              Patient currently awaiting to be out of covid isolation   Consultants:  Palliative care  Procedures:  none  Antimicrobials:    Anti-infectives (From admission, onward)    Start     Dose/Rate Route Frequency Ordered Stop   05/28/21 2200  levofloxacin (LEVAQUIN) IVPB 500 mg        500 mg 100 mL/hr over 60 Minutes Intravenous Every 48 hours 05/26/21 2032 05/30/21 2245   05/27/21 1000  remdesivir 100 mg in sodium chloride 0.9 % 100 mL IVPB        100 mg 200 mL/hr over 30 Minutes Intravenous Daily 05/26/21 1507 05/30/21 1050   05/26/21 2200  levofloxacin (LEVAQUIN) IVPB 750 mg  Status:  Discontinued        750 mg 100 mL/hr over 90 Minutes Intravenous Every 24 hours 05/26/21 2023 05/26/21 2032   05/26/21 2200  levofloxacin (LEVAQUIN) IVPB 750 mg        750 mg 100 mL/hr over 90 Minutes Intravenous  Once 05/26/21 2032 05/26/21 2240   05/26/21 1530  remdesivir 100 mg in sodium chloride 0.9 % 100 mL IVPB        100 mg 200 mL/hr over 30 Minutes Intravenous Every 30 min 05/26/21 1507 05/26/21 1640          Objective: Vitals:   05/31/21 0435 05/31/21 0500 05/31/21 0923 05/31/21 1158  BP: 127/65  (!) 83/58 105/71  Pulse: 90  71 79  Resp: 16  17 18   Temp: 97.6 F (36.4 C)  (!) 97.2 F (36.2 C)   TempSrc: Oral     SpO2: 98%  97% 99%  Weight:  40.7 kg    Height:        Intake/Output Summary (Last 24 hours) at 05/31/2021 1812 Last data filed at 05/31/2021 1215 Gross per 24 hour  Intake 120 ml  Output 1800 ml  Net -1680 ml   Filed Weights   05/29/21 0500 05/30/21 0500 05/31/21 0500  Weight: 43.4 kg 43.6 kg  40.7 kg    Examination:  General exam: alert, awake, communicative,calm, NAD Respiratory system: Clear to auscultation. Respiratory effort normal. Cardiovascular system:  RRR.  Gastrointestinal system: Abdomen is nondistended, soft and nontender.  Normal bowel sounds heard. Central nervous system: Alert and oriented. No focal neurological deficits. Extremities:  no edema Skin: No rashes, lesions or ulcers Psychiatry: Judgement and insight appear normal. Mood & affect appropriate.     Data Reviewed: I have personally reviewed following labs and imaging studies  CBC: Recent Labs  Lab 05/27/21 0406 05/28/21 0352 05/29/21 0408 05/30/21 0424 05/31/21 0340  WBC 12.4* 10.9* 9.7  11.8* 11.0*  NEUTROABS 11.5* 9.5* 8.3* 9.8* 8.7*  HGB 11.7* 11.3* 10.8* 11.7* 11.5*  HCT 35.0* 34.0* 32.4* 34.7* 33.6*  MCV 95.9 95.0 95.3 95.1 93.6  PLT 175 185 195 193 149    Basic Metabolic Panel: Recent Labs  Lab 05/27/21 0406 05/28/21 0352 05/29/21 0408 05/30/21 0424 05/31/21 0340  NA 137 141 134* 133* 131*  K 3.5 3.3* 3.3* 3.2* 4.0  CL 106 108 103 101 100  CO2 24 22 23 23 26   GLUCOSE 173* 98 130* 89 97  BUN 29* 46* 53* 42* 43*  CREATININE 1.08* 1.20* 0.96 0.86 0.93  CALCIUM 8.6* 8.6* 8.5* 8.2* 8.5*  MG 2.4 2.3 2.2 2.2 2.3  PHOS 2.1* 4.5 3.4 2.6 2.0*    GFR: Estimated Creatinine Clearance: 25.8 mL/min (by C-G formula based on SCr of 0.93 mg/dL).  Liver Function Tests: Recent Labs  Lab 05/27/21 0406 05/28/21 0352 05/29/21 0408 05/30/21 0424 05/31/21 0340  AST 26 25 55* 83* 60*  ALT 23 24 39 59* 63*  ALKPHOS 42 39 38 41 41  BILITOT 0.8 0.9 0.8 0.8 1.0  PROT 6.3* 6.3* 6.2* 5.8* 5.8*  ALBUMIN 3.3* 3.3* 3.0* 2.8* 2.9*    CBG: Recent Labs  Lab 05/30/21 2331 05/31/21 0436 05/31/21 0738 05/31/21 1156 05/31/21 1628  GLUCAP 256* 104* 93 249* 387*     Recent Results (from the past 240 hour(s))  Resp Panel by RT-PCR (Flu A&B, Covid) Nasopharyngeal Swab     Status:  Abnormal   Collection Time: 05/26/21  1:35 PM   Specimen: Nasopharyngeal Swab; Nasopharyngeal(NP) swabs in vial transport medium  Result Value Ref Range Status   SARS Coronavirus 2 by RT PCR POSITIVE (A) NEGATIVE Final    Comment: CRITICAL RESULT CALLED TO, READ BACK BY AND VERIFIED WITH: KIERRA, RN @ 7026 ON 05/26/2021 BY LBROOKS, MLT (NOTE) SARS-CoV-2 target nucleic acids are DETECTED.  The SARS-CoV-2 RNA is generally detectable in upper respiratory specimens during the acute phase of infection. Positive results are indicative of the presence of the identified virus, but do not rule out bacterial infection or co-infection with other pathogens not detected by the test. Clinical correlation with patient history and other diagnostic information is necessary to determine patient infection status. The expected result is Negative.  Fact Sheet for Patients: EntrepreneurPulse.com.au  Fact Sheet for Healthcare Providers: IncredibleEmployment.be  This test is not yet approved or cleared by the [place] FDA and  has been authorized for detection and/or diagnosis of SARS-CoV-2 by FDA under an Emergency Use Authorization (EUA).  This EUA will remain in effect (m eaning this test can be used) for the duration of  the COVID-19 declaration under Section 564(b)(1) of the Act, 21 U.S.C. section 360bbb-3(b)(1), unless the authorization is terminated or revoked sooner.     Influenza A by PCR NEGATIVE NEGATIVE Final   Influenza B by PCR NEGATIVE NEGATIVE Final    Comment: (NOTE) The Xpert Xpress SARS-CoV-2/FLU/RSV plus assay is intended as an aid in the diagnosis of influenza from Nasopharyngeal swab specimens and should not be used as a sole basis for treatment. Nasal washings and aspirates are unacceptable for Xpert Xpress SARS-CoV-2/FLU/RSV testing.  Fact Sheet for Patients: EntrepreneurPulse.com.au  Fact Sheet for Healthcare  Providers: IncredibleEmployment.be  This test is not yet approved or cleared by the [place] FDA and has been authorized for detection and/or diagnosis of SARS-CoV-2 by FDA under an Emergency Use Authorization (EUA). This EUA will remain in effect (meaning this test can  be used) for the duration of the COVID-19 declaration under Section 564(b)(1) of the Act, 21 U.S.C. section 360bbb-3(b)(1), unless the authorization is terminated or revoked.  Performed at [facility], [place] [address] [place], [place] [address]   Culture, blood (routine x 2) Call MD if unable to obtain prior to antibiotics being given     Status: None (Preliminary result)   Collection Time: 05/26/21  8:55 PM   Specimen: BLOOD  Result Value Ref Range Status   Specimen Description   Final    BLOOD RIGHT ANTECUBITAL Performed at [facility] [address] [place], [place] [address]    Special Requests   Final    BOTTLES DRAWN AEROBIC ONLY Blood Culture adequate volume Performed at [place] [facility] [address] [place], [place] [address]    Culture   Final    NO GROWTH 4 DAYS Performed at [facility], [place] [address] [place], [place] [address]    Report Status PENDING  Incomplete  Culture, blood (routine x 2) Call MD if unable to obtain prior to antibiotics being given     Status: None (Preliminary result)   Collection Time: 05/26/21  8:55 PM   Specimen: BLOOD  Result Value Ref Range Status   Specimen Description   Final    BLOOD BLOOD RIGHT ARM Performed at [place] [place] [address], [place], [place] [address]    Special Requests   Final    BOTTLES DRAWN AEROBIC ONLY Blood Culture results may not be optimal due to an inadequate volume of blood received in culture bottles Performed at [place] [address], [place], [place] [address]    Culture   Final    NO GROWTH 4  DAYS Performed at [facility], [place] [address] [place], [place] [address]    Report Status PENDING  Incomplete         Radiology Studies: No results found.      Scheduled Meds:  albuterol  2 puff Inhalation BID   apixaban  2.5 mg Oral BID   vitamin C  500 mg Oral Daily   brimonidine  1 drop Right Eye BID   feeding supplement  237 mL Oral BID BM   ferrous sulfate  325 mg Oral Q breakfast   furosemide  20 mg Intravenous Daily   insulin aspart  0-15 Units Subcutaneous Q4H   [START ON 06/01/2021] insulin aspart  2 Units Subcutaneous TID WC   insulin detemir  0.1 Units/kg Subcutaneous BID   latanoprost  1 drop Both Eyes QHS   linagliptin  5 mg Oral Daily   mouth rinse  15 mL Mouth Rinse BID   melatonin  3 mg Oral QHS   multivitamin with minerals  1 tablet Oral Daily   Netarsudil Dimesylate  1 drop Both Eyes QHS   potassium chloride  20 mEq Oral BID   pravastatin  80 mg Oral Daily   [START ON 06/01/2021] predniSONE  40 mg Oral Daily   zinc sulfate  220 mg Oral Daily   Continuous Infusions:     LOS: 4 days   Time spent: 31mins Greater than 50% of this time was spent in counseling, explanation of diagnosis, planning of further management, and coordination of care.   Voice Recognition Viviann Spare dictation system was used to create this note, attempts have been made to correct errors. Please contact the author with questions and/or clarifications.  Florencia Reasons, MD PhD FACP [facility]  Available via Epic secure chat 7am-7pm for nonurgent issues Please page for urgent issues To page the attending provider between 7A-7P or the covering provider during after hours 7P-7A, please log into the web site [email or web address] and access using universal [place] password for that web site. If you do not have the password, please call the hospital operator.    05/31/2021, 6:12 PM

## 2021-05-31 NOTE — Progress Notes (Signed)
Physical Therapy Treatment Patient Details Name: Amy Wolf MRN: [id number] DOB: 1928/05/18 Today's Date: 05/31/2021   History of Present Illness Amy Wolf is a 87 y.o. female with medical history significant of permanent atrial fibrillation, pacemaker placement, coronary artery disease, hypertension, CAD/CABG, chronic systolic heart failure, stage IIIa CKD, type II DM, glaucoma, osteoarthritis, hyperlipidemia, migraine headaches, urinary incontinence who is coming with generalized weakness and dyspnea . She was febrile, and hypoxic. COVID 19 positive. Pt admitetd with Acute respiratory failure with hypoxia probably secondary to a combination of COVID-19 infection and acute CHF.    PT Comments    Mod assist for bed mobility and to transfer bed to recliner. Pt has poor standing balance with significant posterior bias, requiring mod A to prevent loss of balance. She is unable to come to a full upright standing position. Pt performed seated BLE strengthening exercises.    Recommendations for follow up therapy are one component of a multi-disciplinary discharge planning process, led by the attending physician.  Recommendations may be updated based on patient status, additional functional criteria and insurance authorization.  Follow Up Recommendations  Skilled nursing-short term rehab (<3 hours/day)     Assistance Recommended at Discharge Frequent or constant Supervision/Assistance  Equipment Recommendations  None recommended by PT    Recommendations for Other Services       Precautions / Restrictions Precautions Precautions: Fall Precaution Comments: Airborn, does not control legs when standing. slide out Restrictions Weight Bearing Restrictions: No     Mobility  Bed Mobility Overal bed mobility: Needs Assistance Bed Mobility: Supine to Sit     Supine to sit: Mod assist     General bed mobility comments: assist to raise trunk and pivot hips to EOB    Transfers Overall  transfer level: Needs assistance Equipment used: Rolling walker (2 wheels) Transfers: Bed to chair/wheelchair/BSC Sit to Stand: Mod assist           General transfer comment: mod assist to rise, legs not controlled, sliding, during pivot to BSC. Hips flexed, knees extended,  posterior bias. Unable to come to full upright position with 2 attempts at sit to stand with RW.    Ambulation/Gait                   Stairs             Wheelchair Mobility    Modified Rankin (Stroke Patients Only)       Balance Overall balance assessment: Needs assistance Sitting-balance support: Feet supported;No upper extremity supported Sitting balance-Leahy Scale: Good     Standing balance support: Reliant on assistive device for balance;During functional activity Standing balance-Leahy Scale: Poor Standing balance comment: Requires BUE support and external assistance. Posterior lean.                            Cognition Arousal/Alertness: Awake/alert Behavior During Therapy: WFL for tasks assessed/performed;Anxious Overall Cognitive Status: Within Functional Limits for tasks assessed                                 General Comments: Oriented to person, place and month. Stated year is [place], then when asked again said, "22" . knows she has covid. Somewhat anxious        Exercises General Exercises - Lower Extremity Ankle Circles/Pumps: AROM;Both;10 reps;Supine Long Arc Quad: AROM;Both;10 reps;Seated Hip Flexion/Marching: AROM;Both;10 reps;Seated    General  Comments        Pertinent Vitals/Pain Pain Assessment: No/denies pain    Home Living                          Prior Function            PT Goals (current goals can now be found in the care plan section) Acute Rehab PT Goals Patient Stated Goal: to get better PT Goal Formulation: With patient Time For Goal Achievement: 06/11/21 Potential to Achieve Goals: Fair Progress  towards PT goals: Progressing toward goals    Frequency    Min 2X/week      PT Plan Current plan remains appropriate    Co-evaluation              AM-PAC PT "6 Clicks" Mobility   Outcome Measure  Help needed turning from your back to your side while in a flat bed without using bedrails?: A Lot Help needed moving from lying on your back to sitting on the side of a flat bed without using bedrails?: A Lot Help needed moving to and from a bed to a chair (including a wheelchair)?: A Lot Help needed standing up from a chair using your arms (e.g., wheelchair or bedside chair)?: A Lot Help needed to walk in hospital room?: Total Help needed climbing 3-5 steps with a railing? : Total 6 Click Score: 10    End of Session Equipment Utilized During Treatment: Gait belt Activity Tolerance: Patient limited by fatigue Patient left: in chair;with call bell/phone within reach Nurse Communication: Mobility status;Other (comment) (bed saturated in urine) PT Visit Diagnosis: Unsteadiness on feet (R26.81);Difficulty in walking, not elsewhere classified (R26.2)     Time: 6301-6010 PT Time Calculation (min) (ACUTE ONLY): 18 min  Charges:  $Therapeutic Activity: 8-22 mins                    Blondell Reveal Kistler PT 05/31/2021  Acute Rehabilitation Services Pager [phone] Office [phone]

## 2021-05-31 NOTE — Care Management Important Message (Signed)
Important Message  Patient Details IM Letter placed in room for daughter [name] Section Name: Amy Wolf MRN: [id number] Date of Birth: Jan 04, 1928   Medicare Important Message Given:  Yes     Kerin Salen 05/31/2021, 1:14 PM

## 2021-06-01 LAB — CBC
HCT: 34.8 % — ABNORMAL LOW (ref 36.0–46.0)
Hemoglobin: 11.9 g/dL — ABNORMAL LOW (ref 12.0–15.0)
MCH: 31.6 pg (ref 26.0–34.0)
MCHC: 34.2 g/dL (ref 30.0–36.0)
MCV: 92.6 fL (ref 80.0–100.0)
Platelets: 223 10*3/uL (ref 150–400)
RBC: 3.76 MIL/uL — ABNORMAL LOW (ref 3.87–5.11)
RDW: 13 % (ref 11.5–15.5)
WBC: 10.5 10*3/uL (ref 4.0–10.5)
nRBC: 0 % (ref 0.0–0.2)

## 2021-06-01 LAB — COMPREHENSIVE METABOLIC PANEL
ALT: 49 U/L — ABNORMAL HIGH (ref 0–44)
AST: 28 U/L (ref 15–41)
Albumin: 2.9 g/dL — ABNORMAL LOW (ref 3.5–5.0)
Alkaline Phosphatase: 40 U/L (ref 38–126)
Anion gap: 7 (ref 5–15)
BUN: 44 mg/dL — ABNORMAL HIGH (ref 8–23)
CO2: 26 mmol/L (ref 22–32)
Calcium: 8.7 mg/dL — ABNORMAL LOW (ref 8.9–10.3)
Chloride: 100 mmol/L (ref 98–111)
Creatinine, Ser: 1.05 mg/dL — ABNORMAL HIGH (ref 0.44–1.00)
GFR, Estimated: 50 mL/min — ABNORMAL LOW (ref >60)
Glucose, Bld: 86 mg/dL (ref 70–99)
Potassium: 4 mmol/L (ref 3.5–5.1)
Sodium: 133 mmol/L — ABNORMAL LOW (ref 135–145)
Total Bilirubin: 0.9 mg/dL (ref 0.3–1.2)
Total Protein: 5.8 g/dL — ABNORMAL LOW (ref 6.5–8.1)

## 2021-06-01 LAB — PHOSPHORUS: Phosphorus: 2.8 mg/dL (ref 2.5–4.6)

## 2021-06-01 LAB — CULTURE, BLOOD (ROUTINE X 2)
Culture: NO GROWTH
Culture: NO GROWTH
Special Requests: ADEQUATE

## 2021-06-01 LAB — GLUCOSE, CAPILLARY
Glucose-Capillary: 157 mg/dL — ABNORMAL HIGH (ref 70–99)
Glucose-Capillary: 174 mg/dL — ABNORMAL HIGH (ref 70–99)
Glucose-Capillary: 193 mg/dL — ABNORMAL HIGH (ref 70–99)
Glucose-Capillary: 196 mg/dL — ABNORMAL HIGH (ref 70–99)
Glucose-Capillary: 90 mg/dL (ref 70–99)
Glucose-Capillary: 95 mg/dL (ref 70–99)

## 2021-06-01 MED ORDER — PREDNISONE 20 MG PO TABS
30.0000 mg | ORAL_TABLET | Freq: Every day | ORAL | Status: DC
  Administered 2021-06-02: 11:00:00 30 mg via ORAL
  Filled 2021-06-01: qty 1

## 2021-06-01 NOTE — Progress Notes (Signed)
PROGRESS NOTE    Amy Wolf  ZYS:[id number] DOB: 11-01-1927 DOA: 05/26/2021 PCP: Fredirick Lathe, PA-C    Chief Complaint  Patient presents with   Shortness of Breath    Brief Narrative:  hypertension, HLD, noninsulin dependent diabetes, coronary artery disease s/p CABG with mitral and tricuspid annuloplasty rings at that time, ischemic cardiomyopathy with Medtronic ICD, paroxysmal atrial fibrillation, ckdIII, generalized weakness and dyspnea . She was febrile, and hypoxic. COVID 19 positive. CXR showed cardiomegaly with central pulm vessels prominence suggesting CHF. There were increased interstitial and alveolar markings in the perihilar regions and lower lung fields, more so on the right side suggesting asymmetric pulmonary edema or bilateral pneumonia.  Subjective:   She appears  very weak but denies discomfort, no acute interval changes  Awaiting for SNF placement after out of COVID quarantine  Assessment & Plan:   Principal Problem:   Acute respiratory failure with hypoxia ([place]) Active Problems:   Benign essential HTN   Type 2 diabetes mellitus with diabetic chronic kidney disease (HCC)   CAD (coronary artery disease)   Glaucoma   Persistent atrial fibrillation (HCC)   Hypokalemia   Acute respiratory disease due to COVID-19 virus   Chronic systolic CHF (congestive heart failure) (HCC)   Protein-calorie malnutrition, severe  Acute respiratory failure with hypoxia probably secondary to a combination of COVID-19 infection and acute CHF Improving,  Covid infection Currently on room air Finished remdesivir  finished levaquin,   crp appears trending down Plan to start taper steroids  Acute on chronic systolic chf with h/o  ischemic cardiomyopathy with a Medtronic ICD.  Echocardiogram 05/28/2021 with EF of 30 to 35%. Cxr with pulmonary edema on presentation Received Iv lasix, now appear euvolemic, she does not appear to be on any lasix at home, will change to oral  lasix q48hrs, monitor volume status  Continue home meds metoprolol Acei held , plan to resume once blood pressure and cr stable  Hypokalemia/hypophosphatemia  Replace prn Continue monitor   CAD s/p CABG, H/o  mitral and tricuspid annuloplasty rings at the time of her CABG.   On metoprolol/eliquis/statin    permanent atrial fibrillation, in chronic afib, on metoprolol /eliquis  Noninsulin dependent dm2 with hyperglycemia, likely steroids induced  On insulin currently Taper steroids, decrease insulin  CKDIII a/anemia of chronic disease Hgb close to baseline  Cr fluctuating slightly, but appear improving   FTT, PT recommended snf, plan to d/c on 12/1 after 10day out of covid isolation  seen by palliative care confirmed full code. "She would accept artificial life support "for as long as it took" unless providers were certain that she would not recover, then she would not wish to continue artificial life support.  "   Nutritional Assessment: The patient's BMI is: Body mass index is 16.16 kg/m.Marland Kitchen Seen by dietician.  I agree with the assessment and plan as outlined below: Nutrition Status: Nutrition Problem: Severe Malnutrition Etiology: chronic illness (CHF) Signs/Symptoms: severe fat depletion, severe muscle depletion, percent weight loss Percent weight loss: 6.3 % Interventions: Ensure Enlive (each supplement provides 350kcal and 20 grams of protein), MVI  .     Unresulted Labs (From admission, onward)     Start     Ordered   06/02/21 0500  Comprehensive metabolic panel  Tomorrow morning,   R       Question:  Specimen collection method  Answer:  Lab=Lab collect   06/01/21 1748   05/26/21 2024  Expectorated Sputum Assessment w Gram Stain, Rflx  to Resp Cult  (Non-severe pneumonia (non-ICU care) in adult without resistant organism risk factors.)  Once,   R        05/26/21 2023   05/26/21 2023  Expectorated Sputum Assessment w Gram Stain, Rflx to Resp Cult  Once,   R         05/26/21 2023              DVT prophylaxis: apixaban (ELIQUIS) tablet 2.5 mg Start: 05/26/21 2200 apixaban (ELIQUIS) tablet 2.5 mg   Code Status:full Family Communication: patient  Disposition:   Status is: Inpatient  Dispo: The patient is from: home               Anticipated d/c is to: SNF              Anticipated d/c date is: 12/1              Patient currently awaiting to be out of covid isolation   Consultants:  Palliative care  Procedures:  none  Antimicrobials:    Anti-infectives (From admission, onward)    Start     Dose/Rate Route Frequency Ordered Stop   05/28/21 2200  levofloxacin (LEVAQUIN) IVPB 500 mg        500 mg 100 mL/hr over 60 Minutes Intravenous Every 48 hours 05/26/21 2032 05/30/21 2245   05/27/21 1000  remdesivir 100 mg in sodium chloride 0.9 % 100 mL IVPB        100 mg 200 mL/hr over 30 Minutes Intravenous Daily 05/26/21 1507 05/30/21 1050   05/26/21 2200  levofloxacin (LEVAQUIN) IVPB 750 mg  Status:  Discontinued        750 mg 100 mL/hr over 90 Minutes Intravenous Every 24 hours 05/26/21 2023 05/26/21 2032   05/26/21 2200  levofloxacin (LEVAQUIN) IVPB 750 mg        750 mg 100 mL/hr over 90 Minutes Intravenous  Once 05/26/21 2032 05/26/21 2240   05/26/21 1530  remdesivir 100 mg in sodium chloride 0.9 % 100 mL IVPB        100 mg 200 mL/hr over 30 Minutes Intravenous Every 30 min 05/26/21 1507 05/26/21 1640          Objective: Vitals:   05/31/21 1158 05/31/21 1928 06/01/21 0401 06/01/21 1635  BP: 105/71 136/68 129/66 (!) 144/89  Pulse: 79 90 83 90  Resp: 18 20 18 16   Temp:  97.9 F (36.6 C) (!) 97.2 F (36.2 C)   TempSrc:  Oral Oral   SpO2: 99% 99% 98% 97%  Weight:   46.8 kg   Height:        Intake/Output Summary (Last 24 hours) at 06/01/2021 1748 Last data filed at 06/01/2021 1600 Gross per 24 hour  Intake 720 ml  Output 1550 ml  Net -830 ml   Filed Weights   05/30/21 0500 05/31/21 0500 06/01/21 0401  Weight: 43.6  kg 40.7 kg 46.8 kg    Examination:  General exam: thin, frail, alert, awake, communicative,calm, NAD Respiratory system: Clear to auscultation. Respiratory effort normal. Cardiovascular system:  RRR.  Gastrointestinal system: Abdomen is nondistended, soft and nontender.  Normal bowel sounds heard. Central nervous system: Alert and oriented. No focal neurological deficits. Extremities:  no edema Skin: No rashes, lesions or ulcers Psychiatry: Judgement and insight appear normal. Mood & affect appropriate.     Data Reviewed: I have personally reviewed following labs and imaging studies  CBC: Recent Labs  Lab 05/27/21 0406 05/28/21 0352 05/29/21 0408  05/30/21 0424 05/31/21 0340 06/01/21 0415  WBC 12.4* 10.9* 9.7 11.8* 11.0* 10.5  NEUTROABS 11.5* 9.5* 8.3* 9.8* 8.7*  --   HGB 11.7* 11.3* 10.8* 11.7* 11.5* 11.9*  HCT 35.0* 34.0* 32.4* 34.7* 33.6* 34.8*  MCV 95.9 95.0 95.3 95.1 93.6 92.6  PLT 175 185 195 193 211 784    Basic Metabolic Panel: Recent Labs  Lab 05/27/21 0406 05/28/21 0352 05/29/21 0408 05/30/21 0424 05/31/21 0340 06/01/21 0415  NA 137 141 134* 133* 131* 133*  K 3.5 3.3* 3.3* 3.2* 4.0 4.0  CL 106 108 103 101 100 100  CO2 24 22 23 23 26 26   GLUCOSE 173* 98 130* 89 97 86  BUN 29* 46* 53* 42* 43* 44*  CREATININE 1.08* 1.20* 0.96 0.86 0.93 1.05*  CALCIUM 8.6* 8.6* 8.5* 8.2* 8.5* 8.7*  MG 2.4 2.3 2.2 2.2 2.3  --   PHOS 2.1* 4.5 3.4 2.6 2.0* 2.8    GFR: Estimated Creatinine Clearance: 26.3 mL/min (A) (by C-G formula based on SCr of 1.05 mg/dL (H)).  Liver Function Tests: Recent Labs  Lab 05/28/21 0352 05/29/21 0408 05/30/21 0424 05/31/21 0340 06/01/21 0415  AST 25 55* 83* 60* 28  ALT 24 39 59* 63* 49*  ALKPHOS 39 38 41 41 40  BILITOT 0.9 0.8 0.8 1.0 0.9  PROT 6.3* 6.2* 5.8* 5.8* 5.8*  ALBUMIN 3.3* 3.0* 2.8* 2.9* 2.9*    CBG: Recent Labs  Lab 05/31/21 2340 06/01/21 0359 06/01/21 0741 06/01/21 1146 06/01/21 1633  GLUCAP 103* 95 90 157*  193*     Recent Results (from the past 240 hour(s))  Resp Panel by RT-PCR (Flu A&B, Covid) Nasopharyngeal Swab     Status: Abnormal   Collection Time: 05/26/21  1:35 PM   Specimen: Nasopharyngeal Swab; Nasopharyngeal(NP) swabs in vial transport medium  Result Value Ref Range Status   SARS Coronavirus 2 by RT PCR POSITIVE (A) NEGATIVE Final    Comment: CRITICAL RESULT CALLED TO, READ BACK BY AND VERIFIED WITH: KIERRA, RN @ [phone] ON 05/26/2021 BY LBROOKS, MLT (NOTE) SARS-CoV-2 target nucleic acids are DETECTED.  The SARS-CoV-2 RNA is generally detectable in upper respiratory specimens during the acute phase of infection. Positive results are indicative of the presence of the identified virus, but do not rule out bacterial infection or co-infection with other pathogens not detected by the test. Clinical correlation with patient history and other diagnostic information is necessary to determine patient infection status. The expected result is Negative.  Fact Sheet for Patients: EntrepreneurPulse.com.au  Fact Sheet for Healthcare Providers: IncredibleEmployment.be  This test is not yet approved or cleared by the [place] FDA and  has been authorized for detection and/or diagnosis of SARS-CoV-2 by FDA under an Emergency Use Authorization (EUA).  This EUA will remain in effect (m eaning this test can be used) for the duration of  the COVID-19 declaration under Section 564(b)(1) of the Act, 21 U.S.C. section 360bbb-3(b)(1), unless the authorization is terminated or revoked sooner.     Influenza A by PCR NEGATIVE NEGATIVE Final   Influenza B by PCR NEGATIVE NEGATIVE Final    Comment: (NOTE) The Xpert Xpress SARS-CoV-2/FLU/RSV plus assay is intended as an aid in the diagnosis of influenza from Nasopharyngeal swab specimens and should not be used as a sole basis for treatment. Nasal washings and aspirates are unacceptable for Xpert Xpress  SARS-CoV-2/FLU/RSV testing.  Fact Sheet for Patients: EntrepreneurPulse.com.au  Fact Sheet for Healthcare Providers: IncredibleEmployment.be  This test is not yet approved  or cleared by the [place] and has been authorized for detection and/or diagnosis of SARS-CoV-2 by FDA under an Emergency Use Authorization (EUA). This EUA will remain in effect (meaning this test can be used) for the duration of the COVID-19 declaration under Section 564(b)(1) of the Act, 21 U.S.C. section 360bbb-3(b)(1), unless the authorization is terminated or revoked.  Performed at [facility], [place] [address] [place], [place] [address]   Culture, blood (routine x 2) Call MD if unable to obtain prior to antibiotics being given     Status: None   Collection Time: 05/26/21  8:55 PM   Specimen: BLOOD  Result Value Ref Range Status   Specimen Description   Final    BLOOD RIGHT ANTECUBITAL Performed at [facility] [address], [place], [place] [address]    Special Requests   Final    BOTTLES DRAWN AEROBIC ONLY Blood Culture adequate volume Performed at [facility] [address], [place], [place] [address]    Culture   Final    NO GROWTH 5 DAYS Performed at [facility], [place] [address] [place], [place] [address]    Report Status 06/01/2021 FINAL  Final  Culture, blood (routine x 2) Call MD if unable to obtain prior to antibiotics being given     Status: None   Collection Time: 05/26/21  8:55 PM   Specimen: BLOOD  Result Value Ref Range Status   Specimen Description   Final    BLOOD BLOOD RIGHT ARM Performed at [facility] [address], [place], [place] [address]    Special Requests   Final    BOTTLES DRAWN AEROBIC ONLY Blood Culture results may not be optimal due to an inadequate volume of blood received in culture bottles Performed at [facility] [address], [place], [place] [address]    Culture   Final    NO GROWTH 5 DAYS Performed at [facility], [place] [address], [place],  [address]    Report Status 06/01/2021 FINAL  Final         Radiology Studies: No results found.      Scheduled Meds:  albuterol  2 puff Inhalation BID   apixaban  2.5 mg Oral BID   vitamin C  500 mg Oral Daily   brimonidine  1 drop Right Eye BID   feeding supplement  237 mL Oral BID BM   ferrous sulfate  325 mg Oral Q breakfast   furosemide  20 mg Oral Q M,W,F   insulin aspart  0-15 Units Subcutaneous Q4H   insulin aspart  2 Units Subcutaneous TID WC   latanoprost  1 drop Both Eyes QHS   linagliptin  5 mg Oral Daily   mouth rinse  15 mL Mouth Rinse BID   melatonin  3 mg Oral QHS   multivitamin with minerals  1 tablet Oral Daily   Netarsudil Dimesylate  1 drop Both Eyes QHS   phosphorus  250 mg Oral Daily   pravastatin  80 mg Oral Daily   [START ON 06/02/2021] predniSONE  30 mg Oral Daily   zinc sulfate  220 mg Oral Daily   Continuous Infusions:     LOS: 5 days   Time spent: 35mins Greater than 50% of this time was spent in counseling, explanation of diagnosis, planning of further management, and coordination of care.   Voice Recognition Viviann Spare dictation system was used  to create this note, attempts have been made to correct errors. Please contact the author with questions and/or clarifications.   Florencia Reasons, MD PhD FACP [facility]  Available via Epic secure chat 7am-7pm for nonurgent issues Please page for urgent issues To page the attending provider between 7A-7P or the covering provider during after hours 7P-7A, please log into the web site [email or web address] and access using universal [place] password for that web site. If you do not have the password, please call the hospital operator.    06/01/2021, 5:48 PM

## 2021-06-02 LAB — COMPREHENSIVE METABOLIC PANEL
ALT: 45 U/L — ABNORMAL HIGH (ref 0–44)
AST: 35 U/L (ref 15–41)
Albumin: 3 g/dL — ABNORMAL LOW (ref 3.5–5.0)
Alkaline Phosphatase: 41 U/L (ref 38–126)
Anion gap: 8 (ref 5–15)
BUN: 44 mg/dL — ABNORMAL HIGH (ref 8–23)
CO2: 26 mmol/L (ref 22–32)
Calcium: 8.5 mg/dL — ABNORMAL LOW (ref 8.9–10.3)
Chloride: 97 mmol/L — ABNORMAL LOW (ref 98–111)
Creatinine, Ser: 0.99 mg/dL (ref 0.44–1.00)
GFR, Estimated: 54 mL/min — ABNORMAL LOW (ref >60)
Glucose, Bld: 172 mg/dL — ABNORMAL HIGH (ref 70–99)
Potassium: 5.3 mmol/L — ABNORMAL HIGH (ref 3.5–5.1)
Sodium: 131 mmol/L — ABNORMAL LOW (ref 135–145)
Total Bilirubin: 1 mg/dL (ref 0.3–1.2)
Total Protein: 5.9 g/dL — ABNORMAL LOW (ref 6.5–8.1)

## 2021-06-02 LAB — URINALYSIS, ROUTINE W REFLEX MICROSCOPIC
Bilirubin Urine: NEGATIVE
Glucose, UA: NEGATIVE mg/dL
Ketones, ur: NEGATIVE mg/dL
Leukocytes,Ua: NEGATIVE
Nitrite: NEGATIVE
Protein, ur: 100 mg/dL — AB
RBC / HPF: >50 RBC/hpf — ABNORMAL HIGH (ref 0–5)
Specific Gravity, Urine: 1.005 (ref 1.005–1.030)
pH: 6 (ref 5.0–8.0)

## 2021-06-02 LAB — GLUCOSE, CAPILLARY
Glucose-Capillary: 158 mg/dL — ABNORMAL HIGH (ref 70–99)
Glucose-Capillary: 103 mg/dL — ABNORMAL HIGH (ref 70–99)
Glucose-Capillary: 156 mg/dL — ABNORMAL HIGH (ref 70–99)
Glucose-Capillary: 302 mg/dL — ABNORMAL HIGH (ref 70–99)
Glucose-Capillary: 295 mg/dL — ABNORMAL HIGH (ref 70–99)

## 2021-06-02 MED ORDER — PREDNISONE 20 MG PO TABS
20.0000 mg | ORAL_TABLET | Freq: Every day | ORAL | Status: DC
  Administered 2021-06-03 – 2021-06-05 (×3): 20 mg via ORAL
  Filled 2021-06-02 (×3): qty 1

## 2021-06-02 MED ORDER — SENNOSIDES-DOCUSATE SODIUM 8.6-50 MG PO TABS
1.0000 | ORAL_TABLET | Freq: Two times a day (BID) | ORAL | Status: DC
  Administered 2021-06-02 – 2021-06-06 (×9): 1 via ORAL
  Filled 2021-06-02 (×9): qty 1

## 2021-06-02 MED ORDER — INSULIN ASPART 100 UNIT/ML IJ SOLN
0.0000 [IU] | Freq: Three times a day (TID) | INTRAMUSCULAR | Status: DC
Start: 2021-06-02 — End: 2021-06-06
  Administered 2021-06-02: 21:00:00 8 [IU] via SUBCUTANEOUS
  Administered 2021-06-03: 21:00:00 5 [IU] via SUBCUTANEOUS
  Administered 2021-06-04: 2 [IU] via SUBCUTANEOUS
  Administered 2021-06-04: 3 [IU] via SUBCUTANEOUS
  Administered 2021-06-05: 8 [IU] via SUBCUTANEOUS
  Administered 2021-06-05: 5 [IU] via SUBCUTANEOUS
  Administered 2021-06-05: 3 [IU] via SUBCUTANEOUS
  Administered 2021-06-06: 2 [IU] via SUBCUTANEOUS

## 2021-06-02 MED ORDER — FUROSEMIDE 20 MG PO TABS
20.0000 mg | ORAL_TABLET | Freq: Every day | ORAL | Status: DC
  Administered 2021-06-03 – 2021-06-06 (×4): 20 mg via ORAL
  Filled 2021-06-02 (×4): qty 1

## 2021-06-02 MED ORDER — INSULIN GLARGINE-YFGN 100 UNIT/ML ~~LOC~~ SOLN
4.0000 [IU] | Freq: Two times a day (BID) | SUBCUTANEOUS | Status: DC
  Administered 2021-06-02 – 2021-06-06 (×8): 4 [IU] via SUBCUTANEOUS
  Filled 2021-06-02 (×9): qty 0.04

## 2021-06-02 MED ORDER — METOPROLOL TARTRATE 25 MG PO TABS
12.5000 mg | ORAL_TABLET | Freq: Two times a day (BID) | ORAL | Status: DC
  Administered 2021-06-02 – 2021-06-06 (×8): 12.5 mg via ORAL
  Filled 2021-06-02 (×8): qty 1

## 2021-06-02 MED ORDER — FUROSEMIDE 20 MG PO TABS: 20.0000 mg | ORAL_TABLET | Freq: Every day | ORAL | Status: DC

## 2021-06-02 MED ORDER — METOPROLOL TARTRATE 5 MG/5ML IV SOLN: 2.5000 mg | INTRAVENOUS | Status: DC | PRN

## 2021-06-02 MED ORDER — FUROSEMIDE 10 MG/ML IJ SOLN
20.0000 mg | Freq: Once | INTRAMUSCULAR | Status: AC
  Administered 2021-06-02: 13:00:00 20 mg via INTRAVENOUS
  Filled 2021-06-02: qty 2

## 2021-06-02 MED ORDER — POLYETHYLENE GLYCOL 3350 17 G PO PACK
17.0000 g | PACK | Freq: Every day | ORAL | Status: DC
  Administered 2021-06-02 – 2021-06-06 (×5): 17 g via ORAL
  Filled 2021-06-02 (×5): qty 1

## 2021-06-02 MED ORDER — SODIUM CHLORIDE 0.9 % IV SOLN
1.0000 g | Freq: Every day | INTRAVENOUS | Status: DC
Start: 2021-06-02 — End: 2021-06-04
  Administered 2021-06-02 – 2021-06-04 (×3): 1 g via INTRAVENOUS
  Filled 2021-06-02 (×3): qty 10

## 2021-06-02 NOTE — Progress Notes (Signed)
PROGRESS NOTE    Amy Wolf  ERD:[id number] DOB: 1928/04/17 DOA: 05/26/2021 PCP: Fredirick Lathe, PA-C    Chief Complaint  Patient presents with   Shortness of Breath    Brief Narrative:  hypertension, HLD, noninsulin dependent diabetes, coronary artery disease s/p CABG with mitral and tricuspid annuloplasty rings at that time, ischemic cardiomyopathy with Medtronic ICD, paroxysmal atrial fibrillation, ckdIII, generalized weakness and dyspnea . She was febrile, and hypoxic. COVID 19 positive. CXR showed cardiomegaly with central pulm vessels prominence suggesting CHF. There were increased interstitial and alveolar markings in the perihilar regions and lower lung fields, more so on the right side suggesting asymmetric pulmonary edema or bilateral pneumonia.  Subjective:  She is aaox3, but with poor memory, not a reliable historian,  She appears  very weak but denies discomfort No documented bm  RN reports patient has hematuria Awaiting for SNF placement after out of COVID quarantine  Assessment & Plan:   Principal Problem:   Acute respiratory failure with hypoxia ([place]) Active Problems:   Benign essential HTN   Type 2 diabetes mellitus with diabetic chronic kidney disease (HCC)   CAD (coronary artery disease)   Glaucoma   Persistent atrial fibrillation (HCC)   Hypokalemia   Acute respiratory disease due to COVID-19 virus   Chronic systolic CHF (congestive heart failure) (HCC)   Protein-calorie malnutrition, severe  Acute respiratory failure with hypoxia probably secondary to a combination of COVID-19 infection and acute CHF, reason for admission Improving,  Covid infection Currently on room air Finished remdesivir  finished levaquin,   crp appears trending down Plan to start taper steroids  Acute on chronic systolic chf with h/o  ischemic cardiomyopathy with a Medtronic ICD.  -Echocardiogram 05/28/2021 with EF of 30 to 35%. -Cxr with pulmonary edema on  presentation -On Lasix, monitor volume status  -Continue metoprolol -Home meds Acei held , plan to resume once blood pressure and cr stable  Hypokalemia/hypophosphatemia  Replace prn Continue monitor   CAD s/p CABG, H/o  mitral and tricuspid annuloplasty rings at the time of her CABG.   On metoprolol/eliquis/statin    permanent atrial fibrillation, in chronic afib, on metoprolol /eliquis  Noninsulin dependent dm2 with hyperglycemia, likely steroids induced  On insulin currently  attempted to decrease insulin while tapering steroid, a.m. blood glucose increased, restarted back on long-acting insulin  CKDIII a/anemia of chronic disease Hgb close to baseline  Cr fluctuating slightly, but appear improving  Hematuria, on 11/27 -UA with many bacteria, urine culture pending -Empiric start Rocephin, follow-up on culture result, monitor hemoglobin, may need to hold Eliquis if hematuria persist  FTT, PT recommended snf, plan to d/c on 12/1 after 10day out of covid isolation  seen by palliative care confirmed full code. "She would accept artificial life support "for as long as it took" unless providers were certain that she would not recover, then she would not wish to continue artificial life support.  "   Nutritional Assessment: The patient's BMI is: Body mass index is 16.06 kg/m.Marland Kitchen Seen by dietician.  I agree with the assessment and plan as outlined below: Nutrition Status: Nutrition Problem: Severe Malnutrition Etiology: chronic illness (CHF) Signs/Symptoms: severe fat depletion, severe muscle depletion, percent weight loss Percent weight loss: 6.3 % Interventions: Ensure Enlive (each supplement provides 350kcal and 20 grams of protein), MVI  .     Unresulted Labs (From admission, onward)     Start     Ordered   06/03/21 0500  CBC  Tomorrow morning,  R       Question:  Specimen collection method  Answer:  Lab=Lab collect   06/02/21 1757   06/03/21 8127  Basic metabolic  panel  Tomorrow morning,   R       Question:  Specimen collection method  Answer:  Lab=Lab collect   06/02/21 1757   06/03/21 0500  Hepatic function panel  Tomorrow morning,   R       Question:  Specimen collection method  Answer:  Lab=Lab collect   06/02/21 1757   06/02/21 1325  Urine Culture  Once,   R       Question:  Indication  Answer:  Acute gross hematuria   06/02/21 1324              DVT prophylaxis: apixaban (ELIQUIS) tablet 2.5 mg Start: 05/26/21 2200 apixaban (ELIQUIS) tablet 2.5 mg   Code Status:full Family Communication: patient  Disposition:   Status is: Inpatient  Dispo: The patient is from: home               Anticipated d/c is to: SNF              Anticipated d/c date is: 12/1              Patient currently awaiting to be out of covid isolation   Consultants:  Palliative care  Procedures:  none  Antimicrobials:    Anti-infectives (From admission, onward)    Start     Dose/Rate Route Frequency Ordered Stop   06/02/21 1600  cefTRIAXone (ROCEPHIN) 1 g in sodium chloride 0.9 % 100 mL IVPB        1 g 200 mL/hr over 30 Minutes Intravenous Daily 06/02/21 1449     05/28/21 2200  levofloxacin (LEVAQUIN) IVPB 500 mg        500 mg 100 mL/hr over 60 Minutes Intravenous Every 48 hours 05/26/21 2032 05/30/21 2245   05/27/21 1000  remdesivir 100 mg in sodium chloride 0.9 % 100 mL IVPB        100 mg 200 mL/hr over 30 Minutes Intravenous Daily 05/26/21 1507 05/30/21 1050   05/26/21 2200  levofloxacin (LEVAQUIN) IVPB 750 mg  Status:  Discontinued        750 mg 100 mL/hr over 90 Minutes Intravenous Every 24 hours 05/26/21 2023 05/26/21 2032   05/26/21 2200  levofloxacin (LEVAQUIN) IVPB 750 mg        750 mg 100 mL/hr over 90 Minutes Intravenous  Once 05/26/21 2032 05/26/21 2240   05/26/21 1530  remdesivir 100 mg in sodium chloride 0.9 % 100 mL IVPB        100 mg 200 mL/hr over 30 Minutes Intravenous Every 30 min 05/26/21 1507 05/26/21 1640           Objective: Vitals:   06/02/21 1257 06/02/21 1300 06/02/21 1324 06/02/21 1745  BP:    115/64  Pulse:      Resp: (!) 36 (!) 46 (!) 30   Temp:      TempSrc:      SpO2:    96%  Weight:      Height:        Intake/Output Summary (Last 24 hours) at 06/02/2021 1806 Last data filed at 06/02/2021 1800 Gross per 24 hour  Intake 580.06 ml  Output 2415 ml  Net -1834.94 ml   Filed Weights   05/31/21 0500 06/01/21 0401 06/02/21 0444  Weight: 40.7 kg 46.8 kg 46.5 kg  Examination:  General exam: thin, frail, alert, awake, communicative,calm, NAD Respiratory system: Clear to auscultation. Respiratory effort normal. Cardiovascular system:  RRR.  Gastrointestinal system: Abdomen is nondistended, soft and nontender.  Normal bowel sounds heard. Central nervous system: Alert and oriented. No focal neurological deficits. Extremities:  no edema Skin: No rashes, lesions or ulcers Psychiatry: Judgement and insight appear normal. Mood & affect appropriate.     Data Reviewed: I have personally reviewed following labs and imaging studies  CBC: Recent Labs  Lab 05/27/21 0406 05/28/21 0352 05/29/21 0408 05/30/21 0424 05/31/21 0340 06/01/21 0415  WBC 12.4* 10.9* 9.7 11.8* 11.0* 10.5  NEUTROABS 11.5* 9.5* 8.3* 9.8* 8.7*  --   HGB 11.7* 11.3* 10.8* 11.7* 11.5* 11.9*  HCT 35.0* 34.0* 32.4* 34.7* 33.6* 34.8*  MCV 95.9 95.0 95.3 95.1 93.6 92.6  PLT 175 185 195 193 211 149    Basic Metabolic Panel: Recent Labs  Lab 05/27/21 0406 05/28/21 0352 05/29/21 0408 05/30/21 0424 05/31/21 0340 06/01/21 0415 06/02/21 0418  NA 137 141 134* 133* 131* 133* 131*  K 3.5 3.3* 3.3* 3.2* 4.0 4.0 5.3*  CL 106 108 103 101 100 100 97*  CO2 24 22 23 23 26 26 26   GLUCOSE 173* 98 130* 89 97 86 172*  BUN 29* 46* 53* 42* 43* 44* 44*  CREATININE 1.08* 1.20* 0.96 0.86 0.93 1.05* 0.99  CALCIUM 8.6* 8.6* 8.5* 8.2* 8.5* 8.7* 8.5*  MG 2.4 2.3 2.2 2.2 2.3  --   --   PHOS 2.1* 4.5 3.4 2.6 2.0* 2.8   --     GFR: Estimated Creatinine Clearance: 27.7 mL/min (by C-G formula based on SCr of 0.99 mg/dL).  Liver Function Tests: Recent Labs  Lab 05/29/21 0408 05/30/21 0424 05/31/21 0340 06/01/21 0415 06/02/21 0418  AST 55* 83* 60* 28 35  ALT 39 59* 63* 49* 45*  ALKPHOS 38 41 41 40 41  BILITOT 0.8 0.8 1.0 0.9 1.0  PROT 6.2* 5.8* 5.8* 5.8* 5.9*  ALBUMIN 3.0* 2.8* 2.9* 2.9* 3.0*    CBG: Recent Labs  Lab 06/01/21 2350 06/02/21 0416 06/02/21 0751 06/02/21 1201 06/02/21 1650  GLUCAP 174* 158* 103* 156* 302*     Recent Results (from the past 240 hour(s))  Resp Panel by RT-PCR (Flu A&B, Covid) Nasopharyngeal Swab     Status: Abnormal   Collection Time: 05/26/21  1:35 PM   Specimen: Nasopharyngeal Swab; Nasopharyngeal(NP) swabs in vial transport medium  Result Value Ref Range Status   SARS Coronavirus 2 by RT PCR POSITIVE (A) NEGATIVE Final    Comment: CRITICAL RESULT CALLED TO, READ BACK BY AND VERIFIED WITH: KIERRA, RN @ 7026 ON 05/26/2021 BY LBROOKS, MLT (NOTE) SARS-CoV-2 target nucleic acids are DETECTED.  The SARS-CoV-2 RNA is generally detectable in upper respiratory specimens during the acute phase of infection. Positive results are indicative of the presence of the identified virus, but do not rule out bacterial infection or co-infection with other pathogens not detected by the test. Clinical correlation with patient history and other diagnostic information is necessary to determine patient infection status. The expected result is Negative.  Fact Sheet for Patients: EntrepreneurPulse.com.au  Fact Sheet for Healthcare Providers: IncredibleEmployment.be  This test is not yet approved or cleared by the [place] FDA and  has been authorized for detection and/or diagnosis of SARS-CoV-2 by FDA under an Emergency Use Authorization (EUA).  This EUA will remain in effect (m eaning this test can be used) for the duration of  the  COVID-19 declaration under Section 564(b)(1) of the Act, 21 U.S.C. section 360bbb-3(b)(1), unless the authorization is terminated or revoked sooner.     Influenza A by PCR NEGATIVE NEGATIVE Final   Influenza B by PCR NEGATIVE NEGATIVE Final    Comment: (NOTE) The Xpert Xpress SARS-CoV-2/FLU/RSV plus assay is intended as an aid in the diagnosis of influenza from Nasopharyngeal swab specimens and should not be used as a sole basis for treatment. Nasal washings and aspirates are unacceptable for Xpert Xpress SARS-CoV-2/FLU/RSV testing.  Fact Sheet for Patients: EntrepreneurPulse.com.au  Fact Sheet for Healthcare Providers: IncredibleEmployment.be  This test is not yet approved or cleared by the [place] FDA and has been authorized for detection and/or diagnosis of SARS-CoV-2 by FDA under an Emergency Use Authorization (EUA). This EUA will remain in effect (meaning this test can be used) for the duration of the COVID-19 declaration under Section 564(b)(1) of the Act, 21 U.S.C. section 360bbb-3(b)(1), unless the authorization is terminated or revoked.  Performed at [facility], [place] [address] [place], [place] [address]   Culture, blood (routine x 2) Call MD if unable to obtain prior to antibiotics being given     Status: None   Collection Time: 05/26/21  8:55 PM   Specimen: BLOOD  Result Value Ref Range Status   Specimen Description   Final    BLOOD RIGHT ANTECUBITAL Performed at [facility] [address] [place], [place] [address]    Special Requests   Final    BOTTLES DRAWN AEROBIC ONLY Blood Culture adequate volume Performed at [facility] [address], [place], [place] [address]    Culture   Final    NO GROWTH 5 DAYS Performed at [facility], [place] [address], [place], [place] [address]    Report Status 06/01/2021 FINAL  Final  Culture, blood (routine x 2)  Call MD if unable to obtain prior to antibiotics being given     Status: None   Collection Time: 05/26/21  8:55 PM   Specimen: BLOOD  Result Value Ref Range Status   Specimen Description   Final    BLOOD BLOOD RIGHT ARM Performed at [facility] [address], [place], [place] [address]    Special Requests   Final    BOTTLES DRAWN AEROBIC ONLY Blood Culture results may not be optimal due to an inadequate volume of blood received in culture bottles Performed at [facility] [address] [place], [place] [address]    Culture   Final    NO GROWTH 5 DAYS Performed at [facility], [place] [address], [place], [place] [address]    Report Status 06/01/2021 FINAL  Final         Radiology Studies: No results found.      Scheduled Meds:  albuterol  2 puff Inhalation BID   apixaban  2.5 mg Oral BID   vitamin C  500 mg Oral Daily   brimonidine  1 drop Right Eye BID   feeding supplement  237 mL Oral BID BM   ferrous sulfate  325 mg Oral Q breakfast   [START ON 06/03/2021] furosemide  20 mg Oral Daily   insulin aspart  0-15 Units Subcutaneous TID WC & HS   insulin aspart  2 Units Subcutaneous TID WC   insulin glargine-yfgn  4 Units Subcutaneous BID   latanoprost  1 drop Both Eyes QHS   linagliptin  5 mg Oral  Daily   mouth rinse  15 mL Mouth Rinse BID   melatonin  3 mg Oral QHS   metoprolol tartrate  12.5 mg Oral BID   multivitamin with minerals  1 tablet Oral Daily   Netarsudil Dimesylate  1 drop Both Eyes QHS   polyethylene glycol  17 g Oral Daily   pravastatin  80 mg Oral Daily   [START ON 06/03/2021] predniSONE  20 mg Oral Daily   senna-docusate  1 tablet Oral BID   zinc sulfate  220 mg Oral Daily   Continuous Infusions:  cefTRIAXone (ROCEPHIN)  IV Stopped (06/02/21 1718)      LOS: 6 days   Time spent: 4mins Greater than 50% of this time was spent in counseling, explanation of diagnosis, planning of further management,  and coordination of care.   Voice Recognition Viviann Spare dictation system was used to create this note, attempts have been made to correct errors. Please contact the author with questions and/or clarifications.   Florencia Reasons, MD PhD FACP [facility]  Available via Epic secure chat 7am-7pm for nonurgent issues Please page for urgent issues To page the attending provider between 7A-7P or the covering provider during after hours 7P-7A, please log into the web site [email or web address] and access using universal [place] password for that web site. If you do not have the password, please call the hospital operator.    06/02/2021, 6:06 PM

## 2021-06-03 LAB — URINE CULTURE: Culture: <10000 — AB

## 2021-06-03 LAB — GLUCOSE, CAPILLARY
Glucose-Capillary: 89 mg/dL (ref 70–99)
Glucose-Capillary: 96 mg/dL (ref 70–99)
Glucose-Capillary: 104 mg/dL — ABNORMAL HIGH (ref 70–99)
Glucose-Capillary: 229 mg/dL — ABNORMAL HIGH (ref 70–99)

## 2021-06-03 LAB — CBC
HCT: 33.3 % — ABNORMAL LOW (ref 36.0–46.0)
Hemoglobin: 11.4 g/dL — ABNORMAL LOW (ref 12.0–15.0)
MCH: 31.4 pg (ref 26.0–34.0)
MCHC: 34.2 g/dL (ref 30.0–36.0)
MCV: 91.7 fL (ref 80.0–100.0)
Platelets: 231 10*3/uL (ref 150–400)
RBC: 3.63 MIL/uL — ABNORMAL LOW (ref 3.87–5.11)
RDW: 12.9 % (ref 11.5–15.5)
WBC: 10.4 10*3/uL (ref 4.0–10.5)
nRBC: 0 % (ref 0.0–0.2)

## 2021-06-03 LAB — HEPATIC FUNCTION PANEL
ALT: 37 U/L (ref 0–44)
AST: 22 U/L (ref 15–41)
Albumin: 2.7 g/dL — ABNORMAL LOW (ref 3.5–5.0)
Alkaline Phosphatase: 38 U/L (ref 38–126)
Bilirubin, Direct: 0.2 mg/dL (ref 0.0–0.2)
Indirect Bilirubin: 0.6 mg/dL (ref 0.3–0.9)
Total Bilirubin: 0.8 mg/dL (ref 0.3–1.2)
Total Protein: 5.5 g/dL — ABNORMAL LOW (ref 6.5–8.1)

## 2021-06-03 LAB — BASIC METABOLIC PANEL
Anion gap: 8 (ref 5–15)
BUN: 46 mg/dL — ABNORMAL HIGH (ref 8–23)
CO2: 28 mmol/L (ref 22–32)
Calcium: 8.4 mg/dL — ABNORMAL LOW (ref 8.9–10.3)
Chloride: 96 mmol/L — ABNORMAL LOW (ref 98–111)
Creatinine, Ser: 1.03 mg/dL — ABNORMAL HIGH (ref 0.44–1.00)
GFR, Estimated: 52 mL/min — ABNORMAL LOW (ref >60)
Glucose, Bld: 109 mg/dL — ABNORMAL HIGH (ref 70–99)
Potassium: 4.4 mmol/L (ref 3.5–5.1)
Sodium: 132 mmol/L — ABNORMAL LOW (ref 135–145)

## 2021-06-03 NOTE — Progress Notes (Signed)
PROGRESS NOTE    Amy Wolf  RXV:[id number] DOB: July 24, 1927 DOA: 05/26/2021 PCP: Fredirick Lathe, PA-C   Chief Complain:SOB  Brief Narrative:  Patient is a 87 year old female with PMH of hypertension, HLD, noninsulin dependent diabetes, coronary artery disease s/p CABG with mitral and tricuspid annuloplasty rings at that time, ischemic cardiomyopathy with Medtronic ICD, paroxysmal atrial fibrillation, ckdIII, generalized weakness and dyspnea . She was febrile, and hypoxic. COVID 19 positive. CXR showed cardiomegaly with central pulm vessels prominence suggesting CHF. There were increased interstitial and alveolar markings in the perihilar regions and lower lung fields, more so on the right side suggesting asymmetric pulmonary edema or bilateral pneumonia.  Patient was admitted and was managed for COVID infection/possible acute congestive heart failure exacerbation.  Currently hemodynamically stable.  Hospital course was remarkable for hematuria.  Currently on antibiotics for possible UTI.  Discharge planning to skilled nursing facility on 12/1.  Assessment & Plan:   Principal Problem:   Acute respiratory failure with hypoxia (HCC) Active Problems:   Benign essential HTN   Type 2 diabetes mellitus with diabetic chronic kidney disease (HCC)   CAD (coronary artery disease)   Glaucoma   Persistent atrial fibrillation (HCC)   Hypokalemia   Acute respiratory disease due to COVID-19 virus   Chronic systolic CHF (congestive heart failure) (HCC)   Protein-calorie malnutrition, severe   Acute respiratory failure with hypoxia  Probably secondary to a combination of COVID-19 infection and acute CHF. Currently respiratory status is stable.  On room air  Covid infection Currently on room air Finished remdesivir  finished levaquin,   crp appears trending down Steroid being tapered   Acute on chronic systolic chf with h/o  ischemic cardiomyopathy with a Medtronic ICD.  -Echocardiogram  05/28/2021 with EF of 30 to 35%. -Cxr with pulmonary edema on presentation -On oral Lasix  -Continue metoprolol -Home meds Acei held , bp soft   CAD  s/p CABG, H/o  mitral and tricuspid annuloplasty rings at the time of her CABG.   On metoprolol/eliquis/statin     permanent atrial fibrillation  on metoprolol /eliquis   Noninsulin dependent dm2 with hyperglycemia, likely steroids induced  On insulin currently Continue current regiment   CKDIII a/anemia of chronic disease Hgb close to baseline    Hematuria,  Observed on on 11/27 -UA with many bacteria, urine culture pending -Empiric start Rocephin, follow-up on culture result, monitor hemoglobin, may need to hold Eliquis if hematuria persist   FTT  PT recommended snf, plan to d/c on 12/1 after 10day out of covid isolation  seen by palliative care,remains full code      Nutrition Problem: Severe Malnutrition Etiology: chronic illness (CHF)      DVT prophylaxis:Eliquis Code Status: Full Family Communication: None at bedside Patient status:Inpatient  Dispo: The patient is from: Home              Anticipated d/c is to: SNF              Anticipated d/c date is: 12/1  Consultants: Palliative care  Procedures: None  Antimicrobials:  Anti-infectives (From admission, onward)    Start     Dose/Rate Route Frequency Ordered Stop   06/02/21 1600  cefTRIAXone (ROCEPHIN) 1 g in sodium chloride 0.9 % 100 mL IVPB        1 g 200 mL/hr over 30 Minutes Intravenous Daily 06/02/21 1449     05/28/21 2200  levofloxacin (LEVAQUIN) IVPB 500 mg  500 mg 100 mL/hr over 60 Minutes Intravenous Every 48 hours 05/26/21 2032 05/30/21 2245   05/27/21 1000  remdesivir 100 mg in sodium chloride 0.9 % 100 mL IVPB        100 mg 200 mL/hr over 30 Minutes Intravenous Daily 05/26/21 1507 05/30/21 1050   05/26/21 2200  levofloxacin (LEVAQUIN) IVPB 750 mg  Status:  Discontinued        750 mg 100 mL/hr over 90 Minutes Intravenous Every 24  hours 05/26/21 2023 05/26/21 2032   05/26/21 2200  levofloxacin (LEVAQUIN) IVPB 750 mg        750 mg 100 mL/hr over 90 Minutes Intravenous  Once 05/26/21 2032 05/26/21 2240   05/26/21 1530  remdesivir 100 mg in sodium chloride 0.9 % 100 mL IVPB        100 mg 200 mL/hr over 30 Minutes Intravenous Every 30 min 05/26/21 1507 05/26/21 1640       Subjective:  Patient seen and examined the bedside this morning.  Hemodynamically stable, on room air.  Sitting on the bed.  Complains of not being comfortable being in bed.  Denies any shortness of breath or cough.  Objective: Vitals:   06/03/21 0453 06/03/21 0559 06/03/21 0600 06/03/21 1152  BP:  112/68  98/61  Pulse:  83  89  Resp:  16  16  Temp:   98.4 F (36.9 C) 97.7 F (36.5 C)  TempSrc:   Oral Oral  SpO2:  97%  97%  Weight: 41.9 kg     Height:        Intake/Output Summary (Last 24 hours) at 06/03/2021 1346 Last data filed at 06/03/2021 0615 Gross per 24 hour  Intake 220.06 ml  Output 1700 ml  Net -1479.94 ml   Filed Weights   06/01/21 0401 06/02/21 0444 06/03/21 0453  Weight: 46.8 kg 46.5 kg 41.9 kg    Examination:  General exam: Pleasant elderly female, deconditioned HEENT: PERRL Respiratory system:  no wheezes or crackles  Cardiovascular system: S1 & S2 heard, RRR.  Gastrointestinal system: Abdomen is nondistended, soft and nontender. Central nervous system: Alert and oriented Extremities: No edema, no clubbing ,no cyanosis Skin: No rashes, no ulcers,no icterus      Data Reviewed: I have personally reviewed following labs and imaging studies  CBC: Recent Labs  Lab 05/28/21 0352 05/29/21 0408 05/30/21 0424 05/31/21 0340 06/01/21 0415 06/03/21 0428  WBC 10.9* 9.7 11.8* 11.0* 10.5 10.4  NEUTROABS 9.5* 8.3* 9.8* 8.7*  --   --   HGB 11.3* 10.8* 11.7* 11.5* 11.9* 11.4*  HCT 34.0* 32.4* 34.7* 33.6* 34.8* 33.3*  MCV 95.0 95.3 95.1 93.6 92.6 91.7  PLT 185 195 193 211 223 161   Basic Metabolic  Panel: Recent Labs  Lab 05/28/21 0352 05/29/21 0408 05/30/21 0424 05/31/21 0340 06/01/21 0415 06/02/21 0418 06/03/21 0428  NA 141 134* 133* 131* 133* 131* 132*  K 3.3* 3.3* 3.2* 4.0 4.0 5.3* 4.4  CL 108 103 101 100 100 97* 96*  CO2 22 23 23 26 26 26 28   GLUCOSE 98 130* 89 97 86 172* 109*  BUN 46* 53* 42* 43* 44* 44* 46*  CREATININE 1.20* 0.96 0.86 0.93 1.05* 0.99 1.03*  CALCIUM 8.6* 8.5* 8.2* 8.5* 8.7* 8.5* 8.4*  MG 2.3 2.2 2.2 2.3  --   --   --   PHOS 4.5 3.4 2.6 2.0* 2.8  --   --    GFR: Estimated Creatinine Clearance: 24 mL/min (A) (by C-G formula  based on SCr of 1.03 mg/dL (H)). Liver Function Tests: Recent Labs  Lab 05/30/21 0424 05/31/21 0340 06/01/21 0415 06/02/21 0418 06/03/21 0428  AST 83* 60* 28 35 22  ALT 59* 63* 49* 45* 37  ALKPHOS 41 41 40 41 38  BILITOT 0.8 1.0 0.9 1.0 0.8  PROT 5.8* 5.8* 5.8* 5.9* 5.5*  ALBUMIN 2.8* 2.9* 2.9* 3.0* 2.7*   No results for input(s): LIPASE, AMYLASE in the last 168 hours. No results for input(s): AMMONIA in the last 168 hours. Coagulation Profile: No results for input(s): INR, PROTIME in the last 168 hours. Cardiac Enzymes: No results for input(s): CKTOTAL, CKMB, CKMBINDEX, TROPONINI in the last 168 hours. BNP (last 3 results) No results for input(s): PROBNP in the last 8760 hours. HbA1C: No results for input(s): HGBA1C in the last 72 hours. CBG: Recent Labs  Lab 06/02/21 1201 06/02/21 1650 06/02/21 1949 06/03/21 0752 06/03/21 1148  GLUCAP 156* 302* 295* 89 96   Lipid Profile: No results for input(s): CHOL, HDL, LDLCALC, TRIG, CHOLHDL, LDLDIRECT in the last 72 hours. Thyroid Function Tests: No results for input(s): TSH, T4TOTAL, FREET4, T3FREE, THYROIDAB in the last 72 hours. Anemia Panel: No results for input(s): VITAMINB12, FOLATE, FERRITIN, TIBC, IRON, RETICCTPCT in the last 72 hours. Sepsis Labs: No results for input(s): PROCALCITON, LATICACIDVEN in the last 168 hours.  Recent Results (from the past 240  hour(s))  Resp Panel by RT-PCR (Flu A&B, Covid) Nasopharyngeal Swab     Status: Abnormal   Collection Time: 05/26/21  1:35 PM   Specimen: Nasopharyngeal Swab; Nasopharyngeal(NP) swabs in vial transport medium  Result Value Ref Range Status   SARS Coronavirus 2 by RT PCR POSITIVE (A) NEGATIVE Final    Comment: CRITICAL RESULT CALLED TO, READ BACK BY AND VERIFIED WITH: KIERRA, RN @ 1497 ON 05/26/2021 BY LBROOKS, MLT (NOTE) SARS-CoV-2 target nucleic acids are DETECTED.  The SARS-CoV-2 RNA is generally detectable in upper respiratory specimens during the acute phase of infection. Positive results are indicative of the presence of the identified virus, but do not rule out bacterial infection or co-infection with other pathogens not detected by the test. Clinical correlation with patient history and other diagnostic information is necessary to determine patient infection status. The expected result is Negative.  Fact Sheet for Patients: EntrepreneurPulse.com.au  Fact Sheet for Healthcare Providers: IncredibleEmployment.be  This test is not yet approved or cleared by the [place] FDA and  has been authorized for detection and/or diagnosis of SARS-CoV-2 by FDA under an Emergency Use Authorization (EUA).  This EUA will remain in effect (m eaning this test can be used) for the duration of  the COVID-19 declaration under Section 564(b)(1) of the Act, 21 U.S.C. section 360bbb-3(b)(1), unless the authorization is terminated or revoked sooner.     Influenza A by PCR NEGATIVE NEGATIVE Final   Influenza B by PCR NEGATIVE NEGATIVE Final    Comment: (NOTE) The Xpert Xpress SARS-CoV-2/FLU/RSV plus assay is intended as an aid in the diagnosis of influenza from Nasopharyngeal swab specimens and should not be used as a sole basis for treatment. Nasal washings and aspirates are unacceptable for Xpert Xpress SARS-CoV-2/FLU/RSV testing.  Fact Sheet for  Patients: EntrepreneurPulse.com.au  Fact Sheet for Healthcare Providers: IncredibleEmployment.be  This test is not yet approved or cleared by the [place] FDA and has been authorized for detection and/or diagnosis of SARS-CoV-2 by FDA under an Emergency Use Authorization (EUA). This EUA will remain in effect (meaning this test can be used) for  the duration of the COVID-19 declaration under Section 564(b)(1) of the Act, 21 U.S.C. section 360bbb-3(b)(1), unless the authorization is terminated or revoked.  Performed at [facility], [place] [address], [place], [place] [address]   Culture, blood (routine x 2) Call MD if unable to obtain prior to antibiotics being given     Status: None   Collection Time: 05/26/21  8:55 PM   Specimen: BLOOD  Result Value Ref Range Status   Specimen Description   Final    BLOOD RIGHT ANTECUBITAL Performed at [facility] [address], [place], [place] [address]    Special Requests   Final    BOTTLES DRAWN AEROBIC ONLY Blood Culture adequate volume Performed at [facility] [address] [place], [place] [address]    Culture   Final    NO GROWTH 5 DAYS Performed at [facility], [place] [address], [place], [place] [address]    Report Status 06/01/2021 FINAL  Final  Culture, blood (routine x 2) Call MD if unable to obtain prior to antibiotics being given     Status: None   Collection Time: 05/26/21  8:55 PM   Specimen: BLOOD  Result Value Ref Range Status   Specimen Description   Final    BLOOD BLOOD RIGHT ARM Performed at [facility] [address], [place], [place] [address]    Special Requests   Final    BOTTLES DRAWN AEROBIC ONLY Blood Culture results may not be optimal due to an inadequate volume of blood received in culture bottles Performed at [facility] [address], [place], [place]  [address]    Culture   Final    NO GROWTH 5 DAYS Performed at [facility], [place] [address], [place], [place] [address]    Report Status 06/01/2021 FINAL  Final         Radiology Studies: No results found.      Scheduled Meds:  albuterol  2 puff Inhalation BID   apixaban  2.5 mg Oral BID   vitamin C  500 mg Oral Daily   brimonidine  1 drop Right Eye BID   feeding supplement  237 mL Oral BID BM   ferrous sulfate  325 mg Oral Q breakfast   furosemide  20 mg Oral Daily   insulin aspart  0-15 Units Subcutaneous TID WC & HS   insulin aspart  2 Units Subcutaneous TID WC   insulin glargine-yfgn  4 Units Subcutaneous BID   latanoprost  1 drop Both Eyes QHS   linagliptin  5 mg Oral Daily   mouth rinse  15 mL Mouth Rinse BID   melatonin  3 mg Oral QHS   metoprolol tartrate  12.5 mg Oral BID   multivitamin with minerals  1 tablet Oral Daily   Netarsudil Dimesylate  1 drop Both Eyes QHS   polyethylene glycol  17 g Oral Daily   pravastatin  80 mg Oral Daily   predniSONE  20 mg Oral Daily   senna-docusate  1 tablet Oral BID   zinc sulfate  220 mg Oral Daily   Continuous Infusions:  cefTRIAXone (ROCEPHIN)  IV 1 g (06/03/21 1030)     LOS: 7 days    Time spent:25 mins, More than 50% of that time was spent in counseling and/or coordination of care.      Shelly Coss, MD [facility] P11/28/2022, 1:46 PM

## 2021-06-03 NOTE — Care Management Important Message (Signed)
Important Message  Patient Details IM Letter placed in Patients room. Name: Amy Wolf MRN: [id number] Date of Birth: 04-19-1928   Medicare Important Message Given:  Yes     Kerin Salen 06/03/2021, 11:33 AM

## 2021-06-04 LAB — BASIC METABOLIC PANEL
Anion gap: 6 (ref 5–15)
BUN: 46 mg/dL — ABNORMAL HIGH (ref 8–23)
CO2: 28 mmol/L (ref 22–32)
Calcium: 8.3 mg/dL — ABNORMAL LOW (ref 8.9–10.3)
Chloride: 97 mmol/L — ABNORMAL LOW (ref 98–111)
Creatinine, Ser: 1.07 mg/dL — ABNORMAL HIGH (ref 0.44–1.00)
GFR, Estimated: 49 mL/min — ABNORMAL LOW (ref >60)
Glucose, Bld: 87 mg/dL (ref 70–99)
Potassium: 4.2 mmol/L (ref 3.5–5.1)
Sodium: 131 mmol/L — ABNORMAL LOW (ref 135–145)

## 2021-06-04 LAB — GLUCOSE, CAPILLARY
Glucose-Capillary: 78 mg/dL (ref 70–99)
Glucose-Capillary: 111 mg/dL — ABNORMAL HIGH (ref 70–99)
Glucose-Capillary: 163 mg/dL — ABNORMAL HIGH (ref 70–99)
Glucose-Capillary: 131 mg/dL — ABNORMAL HIGH (ref 70–99)

## 2021-06-04 MED ORDER — PROSOURCE PLUS PO LIQD
ORAL | Status: AC
  Filled 2021-06-04: qty 30

## 2021-06-04 MED ORDER — ENSURE ENLIVE PO LIQD
237.0000 mL | ORAL | Status: DC
  Administered 2021-06-05: 237 mL via ORAL

## 2021-06-04 MED ORDER — ALBUTEROL SULFATE HFA 108 (90 BASE) MCG/ACT IN AERS
2.0000 | INHALATION_SPRAY | Freq: Four times a day (QID) | RESPIRATORY_TRACT | Status: DC | PRN
  Administered 2021-06-05: 2 via RESPIRATORY_TRACT

## 2021-06-04 MED ORDER — PROSOURCE PLUS PO LIQD
30.0000 mL | Freq: Every day | ORAL | Status: DC
  Administered 2021-06-04 – 2021-06-05 (×2): 30 mL via ORAL
  Filled 2021-06-04: qty 30

## 2021-06-04 NOTE — Progress Notes (Signed)
Physical Therapy Treatment Patient Details Name: Amy Wolf MRN: [id number] DOB: [birth year] Today's Date: 06/04/2021   History of Present Illness 87 y.o. female with medical history significant of permanent atrial fibrillation, pacemaker placement, coronary artery disease, hypertension, CAD/CABG, chronic systolic heart failure, stage IIIa CKD, type II DM, glaucoma, osteoarthritis, hyperlipidemia, migraine headaches, urinary incontinence who is coming with generalized weakness and dyspnea . She was febrile, and hypoxic. COVID 19 positive. Pt admitetd with Acute respiratory failure with hypoxia probably secondary to a combination of COVID-19 infection and acute CHF.    PT Comments    Pt agreeable to working with therapy. She politely declined sitting in recliner. Worked on bed mobility and transfers. Sit to stands x 3 for strengthening and endurance/activity tolerance. Pt stood for ~45 seconds each time. HR 88 bpm and O2 96% on RA. Cues for breathing technique-pt seems to get fatigued and anxious with activity. Assisted pt back to bed end of session.    Recommendations for follow up therapy are one component of a multi-disciplinary discharge planning process, led by the attending physician.  Recommendations may be updated based on patient status, additional functional criteria and insurance authorization.  Follow Up Recommendations  Skilled nursing-short term rehab (<3 hours/day)     Assistance Recommended at Discharge Frequent or constant Supervision/Assistance  Equipment Recommendations  None recommended by PT    Recommendations for Other Services       Precautions / Restrictions Precautions Precautions: Fall Precaution Comments: does not control legs when standing--slide out Restrictions Weight Bearing Restrictions: No     Mobility  Bed Mobility Overal bed mobility: Needs Assistance Bed Mobility: Supine to Sit     Supine to sit: Min assist;HOB elevated Sit to supine: Min  assist   General bed mobility comments: Assist for trunk and LEs. Increased time. Cues provided.    Transfers Overall transfer level: Needs assistance   Transfers: Sit to/from Stand Sit to Stand: Min assist;From elevated surface           General transfer comment: Sit to stand x 3 for strengthening, activity tolerance. Pt stood for ~45 seconds each time before fatiguing. HR 88 bpm, O2 96% on RA, dyspnea 2/4 with activity. Pt politely declined sitting in recliner on today. Transfer via Lift Equipment: Stedy  Ambulation/Gait                   Stairs             Wheelchair Mobility    Modified Rankin (Stroke Patients Only)       Balance Overall balance assessment: Needs assistance Sitting-balance support: Feet supported Sitting balance-Leahy Scale: Good     Standing balance support: Bilateral upper extremity supported;Reliant on assistive device for balance Standing balance-Leahy Scale: Poor                              Cognition Arousal/Alertness: Awake/alert Behavior During Therapy: WFL for tasks assessed/performed;Anxious Overall Cognitive Status: No family/caregiver present to determine baseline cognitive functioning                                 General Comments: mild anxiety. some mild confusion        Exercises      General Comments        Pertinent Vitals/Pain Pain Assessment: Faces Faces Pain Scale: No hurt    Home Living  Prior Function            PT Goals (current goals can now be found in the care plan section) Progress towards PT goals: Progressing toward goals    Frequency    Min 2X/week      PT Plan Current plan remains appropriate    Co-evaluation              AM-PAC PT "6 Clicks" Mobility   Outcome Measure  Help needed turning from your back to your side while in a flat bed without using bedrails?: A Lot Help needed moving from lying on  your back to sitting on the side of a flat bed without using bedrails?: A Lot Help needed moving to and from a bed to a chair (including a wheelchair)?: A Lot Help needed standing up from a chair using your arms (e.g., wheelchair or bedside chair)?: A Lot Help needed to walk in hospital room?: Total Help needed climbing 3-5 steps with a railing? : Total 6 Click Score: 10    End of Session Equipment Utilized During Treatment: Gait belt Activity Tolerance: Patient limited by fatigue Patient left: in bed;with call bell/phone within reach;with bed alarm set Nurse Communication:  (called out to desk for nursing to assist pt with hygiene cleanup/purewick-no one ever came during session. call light still flashing when I left) PT Visit Diagnosis: Difficulty in walking, not elsewhere classified (R26.2);Muscle weakness (generalized) (M62.81)     Time: 2111-5520 PT Time Calculation (min) (ACUTE ONLY): 30 min  Charges:  $Therapeutic Activity: 23-37 mins                        Doreatha Massed, PT Acute Rehabilitation  Office: [phone] Pager: [phone]

## 2021-06-04 NOTE — Progress Notes (Signed)
Nutrition Follow-up  DOCUMENTATION CODES:   Severe malnutrition in context of chronic illness, Underweight  INTERVENTION:  - recommend liberalize diet from Heart Healthy/Carb Modified to Carb Modified (cleared by MD). - continue Ensure Plus High Protein but will decrease from BID to once/day, each supplement provides 350 kcal and 20 grams of protein. - will order 30 ml Prosource Plus once/day, each supplement provides 100 kcal and 15 grams protein.    NUTRITION DIAGNOSIS:   Severe Malnutrition related to chronic illness (CHF) as evidenced by severe fat depletion, severe muscle depletion, percent weight loss. -ongoing  GOAL:   Patient will meet greater than or equal to 90% of their needs -minimally met on average  MONITOR:   PO intake, Supplement acceptance, Labs, Weight trends, I & O's  ASSESSMENT:   87 yo female with a PMH of permanent atrial fibrillation, pacemaker placement, coronary artery disease, hypertension, CAD/CABG, chronic systolic heart failure, stage IIIa CKD, type II DM, glaucoma, osteoarthritis, hyperlipidemia, migraine headaches, urinary incontinence who is coming with generalized weakness and dyspnea. Admitted with acute respiratory failure with hypoxia. Found to be COVID+.  She has been eating 25-100% at meals over the past 1 week; 50% on average.   Patient reported being unable to drink 2 bottles of Ensure/day but has been accepting of drinking 1/day. Will make changes outlined above.    Weight today is the lowest weight this hospitalization. No information documented in the edema section of flow sheet this hospitalization.   Per MD note yesterday, anticipated d/c plan is for SNF, possibly on 12/1.    Labs reviewed; CBG: 78 mg/dl, Na: 131 mmol/l, Cl: 97 mmol/l, BUN: 46 mg/dl, creatinine: 1.07 mg/dl, Ca: 8.3 mg/dl, GFR: 49 ml/min.  Medications reviewed; 500 mg ascorbic acid/day, 325 mg ferrous sulfate/day, 20 mg oral lasix/day, sliding scale novolog, 2 units  novolog TID, 4 units semglee BID, 5 mg tradjenta/day, 3 mg melatonin/day, 1 tablet multivitamin with minerals/day, 17 g miralax/day 20 mg deltasone/day, 1 tablet senokot BID, 220 mg zinc sulfate/day.    Diet Order:   Diet Order             Diet heart healthy/carb modified Room service appropriate? Yes; Fluid consistency: Thin  Diet effective now                   EDUCATION NEEDS:   No education needs have been identified at this time  Skin:  Skin Assessment: Reviewed RN Assessment  Last BM:  05/25/21  Height:   Ht Readings from Last 1 Encounters:  05/26/21 _0  (1.702 m)    Weight:   Wt Readings from Last 1 Encounters:  06/04/21 39.9 kg     Estimated Nutritional Needs:  Kcal:  1600-1800 Protein:  75-85 grams Fluid:  >/= 1.8 L/day      Jarome Matin, MS, RD, LDN, CNSC Inpatient Clinical Dietitian RD pager # available in [facility]  After hours/weekend pager # available in [facility]

## 2021-06-04 NOTE — TOC Progression Note (Signed)
Transition of Care [facility]) - Progression Note    Patient Details  Name: Amy Wolf MRN: [id number] Date of Birth: Dec 01, 1927  [facility] CM/SW Contact  Purcell Mouton, RN Phone Number: 06/04/2021, 12:14 PM  Clinical Narrative:    Pt's daughter asked for [facility]. A call was made to [facility]. Pt may transfer on 12/1 Thursday to [facility].    Expected Discharge Plan: [place] Barriers to Discharge: No Barriers Identified  Expected Discharge Plan and Services Expected Discharge Plan: [place] arrangements for the past 2 months: Single Family Home                                       Social Determinants of Health (SDOH) Interventions    Readmission Risk Interventions No flowsheet data found.

## 2021-06-04 NOTE — TOC Progression Note (Signed)
Transition of Care [facility]) - Progression Note    Patient Details  Name: Amy Wolf MRN: [id number] Date of Birth: [birth year]  Transition of Care [facility]) CM/SW Contact  Purcell Mouton, RN Phone Number: 06/04/2021, 10:15 AM  Clinical Narrative:     Spoke with pt's daughter [name] this AM. Pt will need SNF.    Expected Discharge Plan: [place] Barriers to Discharge: No Barriers Identified  Expected Discharge Plan and Services Expected Discharge Plan: [place] arrangements for the past 2 months: Single Family Home                                       Social Determinants of Health (SDOH) Interventions    Readmission Risk Interventions No flowsheet data found.

## 2021-06-04 NOTE — Progress Notes (Signed)
Occupational Therapy Treatment Patient Details Name: Amy Wolf MRN: [id number] DOB: Dec 04, 1927 Today's Date: 06/04/2021   History of present illness 87 y.o. female with medical history significant of permanent atrial fibrillation, pacemaker placement, coronary artery disease, hypertension, CAD/CABG, chronic systolic heart failure, stage IIIa CKD, type II DM, glaucoma, osteoarthritis, hyperlipidemia, migraine headaches, urinary incontinence who is coming with generalized weakness and dyspnea . She was febrile, and hypoxic. COVID 19 positive. Pt admitetd with Acute respiratory failure with hypoxia probably secondary to a combination of COVID-19 infection and acute CHF.   OT comments  Patient progressing and showed improved willingness to work with therapy on transfers despite anxiety and fear of falling, compared to previous session. Pt was unable to to take steps with RW and required bear-hug technique to pivot to chair with Max As. Patient remains limited by fear/anxiety, decreased mood, generalized weakness and decreased activity tolerance along with deficits noted below. Pt continues to demonstrate fair rehab potential and would benefit from continued skilled OT to increase safety and independence with ADLs and functional transfers to allow pt to return home safely and reduce caregiver burden and fall risk.    Recommendations for follow up therapy are one component of a multi-disciplinary discharge planning process, led by the attending physician.  Recommendations may be updated based on patient status, additional functional criteria and insurance authorization.    Follow Up Recommendations  Skilled nursing-short term rehab (<3 hours/day)    Assistance Recommended at Discharge Frequent or constant Supervision/Assistance  Equipment Recommendations       Recommendations for Other Services      Precautions / Restrictions Precautions Precautions: Fall Precaution Comments: does not control  legs when standing--slide out Restrictions Weight Bearing Restrictions: No       Mobility Bed Mobility Overal bed mobility: Needs Assistance Bed Mobility: Supine to Sit     Supine to sit: Min assist;HOB elevated Sit to supine: Min assist   General bed mobility comments: Assist for trunk and LEs. Increased time. Cues provided.    Transfers Overall transfer level: Needs assistance Equipment used: Rolling walker (2 wheels) Transfers: Sit to/from Stand Sit to Stand: Min assist;Mod assist           General transfer comment: Please see ADL section and Toilet transfer dfor detials. Transfer via Lift Equipment: Stedy   Balance Overall balance assessment: Needs assistance Sitting-balance support: Feet supported;No upper extremity supported Sitting balance-Leahy Scale: Poor Sitting balance - Comments: Progressed to good.   Standing balance support: Bilateral upper extremity supported;Reliant on assistive device for balance Standing balance-Leahy Scale: Poor Standing balance comment: Requires BUE support and external assistance. Posterior lean.                           ADL either performed or assessed with clinical judgement   ADL Overall ADL's : Needs assistance/impaired Eating/Feeding: Sitting;Minimal assistance Eating/Feeding Details (indicate cue type and reason): Pt sat EOB to eat jello and sip water with ocassional posteriorloss of balance and Min As with cues to correct. Pt eventually improved balance and only required supervision with cues. Grooming: Min guard Grooming Details (indicate cue type and reason): EOB, pt washed face and hands with Min guard to Min As for balance, progressing to SBA.                   Toilet Transfer Details (indicate cue type and reason): With max encoragement, pt agreed to transfer to recliner. Declined need to void.  Pt stood from EOB x 2 (self-initiated sitting back to EOB after 1st stand due to fear of falling) with Mod  As. Pt initially tried RW and did 1 pivot but then asked to sit 2nd time as she felt unsafe with walker. Pt agreed to bearhug technique and was assisted to recliner with Max As of 1 person and verbal cues for technique. Toileting- Clothing Manipulation and Hygiene: Total assistance Toileting - Clothing Manipulation Details (indicate cue type and reason): Pt found to be soiled with first stand. After second stand, pt assisted with total peri hygiene while pt gripped RW and given external Min-Mod As for standing.     Functional mobility during ADLs: Maximal assistance;Moderate assistance;Rolling walker (2 wheels);Cueing for safety;Cueing for sequencing      Extremity/Trunk Assessment Upper Extremity Assessment Upper Extremity Assessment: Generalized weakness   Lower Extremity Assessment Lower Extremity Assessment: Generalized weakness   Cervical / Trunk Assessment Cervical / Trunk Assessment: Kyphotic    Vision Patient Visual Report: No change from baseline     Perception     Praxis      Cognition Arousal/Alertness: Awake/alert Behavior During Therapy: Flat affect;Anxious Overall Cognitive Status: No family/caregiver present to determine baseline cognitive functioning                                 General Comments: mild anxiety. some mild confusion, diffiicult to please.          Exercises     Shoulder Instructions       General Comments      Pertinent Vitals/ Pain       Pain Assessment: No/denies pain Faces Pain Scale: No hurt  Home Living                                          Prior Functioning/Environment              Frequency  Min 2X/week        Progress Toward Goals  OT Goals(current goals can now be found in the care plan section)  Progress towards OT goals: Progressing toward goals     Plan      Co-evaluation                 AM-PAC OT "6 Clicks" Daily Activity     Outcome Measure   Help  from another person eating meals?: None Help from another person taking care of personal grooming?: A Little Help from another person toileting, which includes using toliet, bedpan, or urinal?: Total Help from another person bathing (including washing, rinsing, drying)?: A Lot Help from another person to put on and taking off regular upper body clothing?: A Little Help from another person to put on and taking off regular lower body clothing?: A Lot 6 Click Score: 15    End of Session Equipment Utilized During Treatment: Gait belt;Rolling walker (2 wheels)  OT Visit Diagnosis: Unsteadiness on feet (R26.81);Other symptoms and signs involving cognitive function;Muscle weakness (generalized) (M62.81)   Activity Tolerance Patient limited by fatigue   Patient Left in chair;with call bell/phone within reach;with chair alarm set   Nurse Communication Mobility status (bearhug back to bed. Pt agreed to stay in chair for 1 hour)        Time: 0272-5366 OT Time Calculation (min): 36 min  Charges: OT General Charges $OT Visit: 1 Visit OT Treatments $Self Care/Home Management : 8-22 mins $Therapeutic Activity: 8-22 mins  Anderson Malta, OT Acute Rehab Services Office: [phone] 06/04/2021  Julien Girt 06/04/2021, 2:37 PM

## 2021-06-04 NOTE — Progress Notes (Signed)
PROGRESS NOTE    Amy Wolf  UXN:[id number] DOB: January 14, 1928 DOA: 05/26/2021 PCP: Fredirick Lathe, PA-C   Chief Complain:SOB  Brief Narrative:  Patient is a 87 year old female with PMH of hypertension, HLD, noninsulin dependent diabetes, coronary artery disease s/p CABG with mitral and tricuspid annuloplasty rings at that time, ischemic cardiomyopathy with Medtronic ICD, paroxysmal atrial fibrillation, ckdIII, generalized weakness and dyspnea . She was febrile, and hypoxic. COVID 19 positive. CXR showed cardiomegaly with central pulm vessels prominence suggesting CHF. There were increased interstitial and alveolar markings in the perihilar regions and lower lung fields, more so on the right side suggesting asymmetric pulmonary edema or bilateral pneumonia.  Patient was admitted and was managed for COVID infection/possible acute congestive heart failure exacerbation.  Currently hemodynamically stable.  Hospital course was remarkable for hematuria.  Currently on antibiotics for possible UTI.  Discharge planning to skilled nursing facility on 12/1.  Assessment & Plan:   Principal Problem:   Acute respiratory failure with hypoxia (HCC) Active Problems:   Benign essential HTN   Type 2 diabetes mellitus with diabetic chronic kidney disease (HCC)   CAD (coronary artery disease)   Glaucoma   Persistent atrial fibrillation (HCC)   Hypokalemia   Acute respiratory disease due to COVID-19 virus   Chronic systolic CHF (congestive heart failure) (HCC)   Protein-calorie malnutrition, severe   Acute respiratory failure with hypoxia  Probably secondary to a combination of COVID-19 infection and acute CHF. Currently respiratory status is stable.  On room air  Covid infection Currently on room air Finished remdesivir CRP trended down Steroid being tapered   Acute on chronic systolic chf with h/o  ischemic cardiomyopathy with a Medtronic ICD.  -Echocardiogram 05/28/2021 with EF of 30 to 35%. -Cxr  with pulmonary edema on presentation -On oral Lasix  -Continue metoprolol -Home meds Acei held , bp soft   CAD  s/p CABG, H/o  mitral and tricuspid annuloplasty rings at the time of her CABG.   On metoprolol/eliquis/statin     permanent atrial fibrillation  on metoprolol /eliquis S/P pacemaker   Noninsulin dependent dm2 with hyperglycemia, likely steroids induced  On insulin currently Continue current regiment   CKDIII a/anemia of chronic disease Hgb close to baseline    Hematuria,  Observed on on 11/27 -UA with many bacteria, urine culture showed insignificant growth, she was on ceftriaxone now discontinued   FTT  PT recommended snf, plan to d/c on 12/1 after 10day out of covid isolation  seen by palliative care,remains full code      Nutrition Problem: Severe Malnutrition Etiology: chronic illness (CHF)      DVT prophylaxis:Eliquis Code Status: Full Family Communication: None at bedside Patient status:Inpatient  Dispo: The patient is from: Home              Anticipated d/c is to: SNF              Anticipated d/c date is: 12/1  Consultants: Palliative care  Procedures: None  Antimicrobials:  Anti-infectives (From admission, onward)    Start     Dose/Rate Route Frequency Ordered Stop   06/02/21 1600  cefTRIAXone (ROCEPHIN) 1 g in sodium chloride 0.9 % 100 mL IVPB        1 g 200 mL/hr over 30 Minutes Intravenous Daily 06/02/21 1449     05/28/21 2200  levofloxacin (LEVAQUIN) IVPB 500 mg        500 mg 100 mL/hr over 60 Minutes Intravenous Every 48 hours 05/26/21  2032 05/30/21 2245   05/27/21 1000  remdesivir 100 mg in sodium chloride 0.9 % 100 mL IVPB        100 mg 200 mL/hr over 30 Minutes Intravenous Daily 05/26/21 1507 05/30/21 1050   05/26/21 2200  levofloxacin (LEVAQUIN) IVPB 750 mg  Status:  Discontinued        750 mg 100 mL/hr over 90 Minutes Intravenous Every 24 hours 05/26/21 2023 05/26/21 2032   05/26/21 2200  levofloxacin (LEVAQUIN) IVPB 750 mg         750 mg 100 mL/hr over 90 Minutes Intravenous  Once 05/26/21 2032 05/26/21 2240   05/26/21 1530  remdesivir 100 mg in sodium chloride 0.9 % 100 mL IVPB        100 mg 200 mL/hr over 30 Minutes Intravenous Every 30 min 05/26/21 1507 05/26/21 1640       Subjective:  Patient seen and examined at the bedside this morning.  Hemodynamically stable.  Sitting on the bed.  Denies any shortness of breath or cough.  Complains of weakness  Objective: Vitals:   06/03/21 1152 06/03/21 2054 06/04/21 0212 06/04/21 0426  BP: 98/61 119/62  117/64  Pulse: 89 82  85  Resp: 16 20  15   Temp: 97.7 F (36.5 C) 98.2 F (36.8 C)  98 F (36.7 C)  TempSrc: Oral Oral  Oral  SpO2: 97% 99%  97%  Weight:   39.9 kg   Height:        Intake/Output Summary (Last 24 hours) at 06/04/2021 0838 Last data filed at 06/04/2021 0640 Gross per 24 hour  Intake 240 ml  Output 1525 ml  Net -1285 ml   Filed Weights   06/02/21 0444 06/03/21 0453 06/04/21 0212  Weight: 46.5 kg 41.9 kg 39.9 kg    Examination:   General exam: Elderly female, weak, deconditioned HEENT: PERRL Respiratory system:  no wheezes or crackles  Cardiovascular system: S1 & S2 heard, RRR.  Pacemaker Gastrointestinal system: Abdomen is nondistended, soft and nontender. Central nervous system: Alert and oriented Extremities: No edema, no clubbing ,no cyanosis Skin: No rashes, no ulcers,no icterus     Data Reviewed: I have personally reviewed following labs and imaging studies  CBC: Recent Labs  Lab 05/29/21 0408 05/30/21 0424 05/31/21 0340 06/01/21 0415 06/03/21 0428  WBC 9.7 11.8* 11.0* 10.5 10.4  NEUTROABS 8.3* 9.8* 8.7*  --   --   HGB 10.8* 11.7* 11.5* 11.9* 11.4*  HCT 32.4* 34.7* 33.6* 34.8* 33.3*  MCV 95.3 95.1 93.6 92.6 91.7  PLT 195 193 211 223 570   Basic Metabolic Panel: Recent Labs  Lab 05/29/21 0408 05/30/21 0424 05/31/21 0340 06/01/21 0415 06/02/21 0418 06/03/21 0428 06/04/21 0411  NA 134* 133* 131*  133* 131* 132* 131*  K 3.3* 3.2* 4.0 4.0 5.3* 4.4 4.2  CL 103 101 100 100 97* 96* 97*  CO2 23 23 26 26 26 28 28   GLUCOSE 130* 89 97 86 172* 109* 87  BUN 53* 42* 43* 44* 44* 46* 46*  CREATININE 0.96 0.86 0.93 1.05* 0.99 1.03* 1.07*  CALCIUM 8.5* 8.2* 8.5* 8.7* 8.5* 8.4* 8.3*  MG 2.2 2.2 2.3  --   --   --   --   PHOS 3.4 2.6 2.0* 2.8  --   --   --    GFR: Estimated Creatinine Clearance: 22 mL/min (A) (by C-G formula based on SCr of 1.07 mg/dL (H)). Liver Function Tests: Recent Labs  Lab 05/30/21 0424 05/31/21 0340 06/01/21  6222 06/02/21 0418 06/03/21 0428  AST 83* 60* 28 35 22  ALT 59* 63* 49* 45* 37  ALKPHOS 41 41 40 41 38  BILITOT 0.8 1.0 0.9 1.0 0.8  PROT 5.8* 5.8* 5.8* 5.9* 5.5*  ALBUMIN 2.8* 2.9* 2.9* 3.0* 2.7*   No results for input(s): LIPASE, AMYLASE in the last 168 hours. No results for input(s): AMMONIA in the last 168 hours. Coagulation Profile: No results for input(s): INR, PROTIME in the last 168 hours. Cardiac Enzymes: No results for input(s): CKTOTAL, CKMB, CKMBINDEX, TROPONINI in the last 168 hours. BNP (last 3 results) No results for input(s): PROBNP in the last 8760 hours. HbA1C: No results for input(s): HGBA1C in the last 72 hours. CBG: Recent Labs  Lab 06/03/21 0752 06/03/21 1148 06/03/21 1716 06/03/21 2056 06/04/21 0743  GLUCAP 89 96 104* 229* 78   Lipid Profile: No results for input(s): CHOL, HDL, LDLCALC, TRIG, CHOLHDL, LDLDIRECT in the last 72 hours. Thyroid Function Tests: No results for input(s): TSH, T4TOTAL, FREET4, T3FREE, THYROIDAB in the last 72 hours. Anemia Panel: No results for input(s): VITAMINB12, FOLATE, FERRITIN, TIBC, IRON, RETICCTPCT in the last 72 hours. Sepsis Labs: No results for input(s): PROCALCITON, LATICACIDVEN in the last 168 hours.  Recent Results (from the past 240 hour(s))  Resp Panel by RT-PCR (Flu A&B, Covid) Nasopharyngeal Swab     Status: Abnormal   Collection Time: 05/26/21  1:35 PM   Specimen:  Nasopharyngeal Swab; Nasopharyngeal(NP) swabs in vial transport medium  Result Value Ref Range Status   SARS Coronavirus 2 by RT PCR POSITIVE (A) NEGATIVE Final    Comment: CRITICAL RESULT CALLED TO, READ BACK BY AND VERIFIED WITH: KIERRA, RN @ [phone] ON 05/26/2021 BY LBROOKS, MLT (NOTE) SARS-CoV-2 target nucleic acids are DETECTED.  The SARS-CoV-2 RNA is generally detectable in upper respiratory specimens during the acute phase of infection. Positive results are indicative of the presence of the identified virus, but do not rule out bacterial infection or co-infection with other pathogens not detected by the test. Clinical correlation with patient history and other diagnostic information is necessary to determine patient infection status. The expected result is Negative.  Fact Sheet for Patients: EntrepreneurPulse.com.au  Fact Sheet for Healthcare Providers: IncredibleEmployment.be  This test is not yet approved or cleared by the [place] FDA and  has been authorized for detection and/or diagnosis of SARS-CoV-2 by FDA under an Emergency Use Authorization (EUA).  This EUA will remain in effect (m eaning this test can be used) for the duration of  the COVID-19 declaration under Section 564(b)(1) of the Act, 21 U.S.C. section 360bbb-3(b)(1), unless the authorization is terminated or revoked sooner.     Influenza A by PCR NEGATIVE NEGATIVE Final   Influenza B by PCR NEGATIVE NEGATIVE Final    Comment: (NOTE) The Xpert Xpress SARS-CoV-2/FLU/RSV plus assay is intended as an aid in the diagnosis of influenza from Nasopharyngeal swab specimens and should not be used as a sole basis for treatment. Nasal washings and aspirates are unacceptable for Xpert Xpress SARS-CoV-2/FLU/RSV testing.  Fact Sheet for Patients: EntrepreneurPulse.com.au  Fact Sheet for Healthcare Providers: IncredibleEmployment.be  This  test is not yet approved or cleared by the [place] FDA and has been authorized for detection and/or diagnosis of SARS-CoV-2 by FDA under an Emergency Use Authorization (EUA). This EUA will remain in effect (meaning this test can be used) for the duration of the COVID-19 declaration under Section 564(b)(1) of the Act, 21 U.S.C. section 360bbb-3(b)(1), unless the authorization  is terminated or revoked.  Performed at [facility], [place] [address] [place], [place] [address]   Culture, blood (routine x 2) Call MD if unable to obtain prior to antibiotics being given     Status: None   Collection Time: 05/26/21  8:55 PM   Specimen: BLOOD  Result Value Ref Range Status   Specimen Description   Final    BLOOD RIGHT ANTECUBITAL Performed at [place] [address], [place], [place] [address]    Special Requests   Final    BOTTLES DRAWN AEROBIC ONLY Blood Culture adequate volume Performed at [place] [address], [place], [place] [address]    Culture   Final    NO GROWTH 5 DAYS Performed at [facility], [place] [address], [place], [place] [address]    Report Status 06/01/2021 FINAL  Final  Culture, blood (routine x 2) Call MD if unable to obtain prior to antibiotics being given     Status: None   Collection Time: 05/26/21  8:55 PM   Specimen: BLOOD  Result Value Ref Range Status   Specimen Description   Final    BLOOD BLOOD RIGHT ARM Performed at [place] [facility] [address], [place], [place] [address]    Special Requests   Final    BOTTLES DRAWN AEROBIC ONLY Blood Culture results may not be optimal due to an inadequate volume of blood received in culture bottles Performed at [place] [address] [place], [place] [address]    Culture   Final    NO GROWTH 5 DAYS Performed at [facility], [place] [address] [place], [place] [address]    Report Status  06/01/2021 FINAL  Final  Urine Culture     Status: Abnormal   Collection Time: 06/02/21  2:04 PM   Specimen: Urine, Clean Catch  Result Value Ref Range Status   Specimen Description   Final    URINE, CLEAN CATCH Performed at [facility], [place] [address] [place], [place] [address]    Special Requests   Final    NONE Performed at [facility], [place] [address] [place], [place] [address]    Culture (A)  Final    <10,000 COLONIES/mL INSIGNIFICANT GROWTH Performed at [place] [address] [place],  [address]    Report Status 06/03/2021 FINAL  Final         Radiology Studies: No results found.      Scheduled Meds:  albuterol  2 puff Inhalation BID   apixaban  2.5 mg Oral BID   vitamin C  500 mg Oral Daily   brimonidine  1 drop Right Eye BID   feeding supplement  237 mL Oral BID BM   ferrous sulfate  325 mg Oral Q breakfast   furosemide  20 mg Oral Daily   insulin aspart  0-15 Units Subcutaneous TID WC & HS   insulin aspart  2 Units Subcutaneous TID WC   insulin glargine-yfgn  4 Units Subcutaneous BID   latanoprost  1 drop Both Eyes QHS   linagliptin  5 mg Oral Daily   mouth rinse  15 mL Mouth Rinse BID   melatonin  3 mg Oral QHS   metoprolol tartrate  12.5 mg Oral BID   multivitamin with minerals  1 tablet Oral Daily   Netarsudil Dimesylate  1 drop Both Eyes QHS   polyethylene  glycol  17 g Oral Daily   pravastatin  80 mg Oral Daily   predniSONE  20 mg Oral Daily   senna-docusate  1 tablet Oral BID   zinc sulfate  220 mg Oral Daily   Continuous Infusions:  cefTRIAXone (ROCEPHIN)  IV 1 g (06/03/21 1030)     LOS: 8 days    Time spent:25 mins, More than 50% of that time was spent in counseling and/or coordination of care.      Shelly Coss, MD [facility] P11/29/2022, 8:38 AM

## 2021-06-05 LAB — GLUCOSE, CAPILLARY
Glucose-Capillary: 72 mg/dL (ref 70–99)
Glucose-Capillary: 214 mg/dL — ABNORMAL HIGH (ref 70–99)
Glucose-Capillary: 197 mg/dL — ABNORMAL HIGH (ref 70–99)
Glucose-Capillary: 251 mg/dL — ABNORMAL HIGH (ref 70–99)

## 2021-06-05 MED ORDER — PREDNISONE 10 MG PO TABS
10.0000 mg | ORAL_TABLET | Freq: Every day | ORAL | Status: DC
  Administered 2021-06-06: 10 mg via ORAL
  Filled 2021-06-05: qty 1

## 2021-06-05 NOTE — Progress Notes (Signed)
PROGRESS NOTE    Amy Wolf  WNI:[id number] DOB: June 28, 1928 DOA: 05/26/2021 PCP: Fredirick Lathe, PA-C   Chief Complain:SOB  Brief Narrative:  Patient is a 87 year old female with PMH of hypertension, HLD, noninsulin dependent diabetes, coronary artery disease s/p CABG with mitral and tricuspid annuloplasty rings at that time, ischemic cardiomyopathy with Medtronic ICD, paroxysmal atrial fibrillation, ckdIII, generalized weakness and dyspnea . She was febrile, and hypoxic. COVID 19 positive. CXR showed cardiomegaly with central pulm vessels prominence suggesting CHF. There were increased interstitial and alveolar markings in the perihilar regions and lower lung fields, more so on the right side suggesting asymmetric pulmonary edema or bilateral pneumonia.  Patient was admitted and was managed for COVID infection/possible acute congestive heart failure exacerbation.  Currently hemodynamically stable.  Hospital course was remarkable for hematuria.  Currently on antibiotics for possible UTI.  Discharge planning to skilled nursing facility on 12/1.  Medically stable for discharge.  Assessment & Plan:   Principal Problem:   Acute respiratory failure with hypoxia (HCC) Active Problems:   Benign essential HTN   Type 2 diabetes mellitus with diabetic chronic kidney disease (HCC)   CAD (coronary artery disease)   Glaucoma   Persistent atrial fibrillation (HCC)   Hypokalemia   Acute respiratory disease due to COVID-19 virus   Chronic systolic CHF (congestive heart failure) (HCC)   Protein-calorie malnutrition, severe   Acute respiratory failure with hypoxia  Probably secondary to a combination of COVID-19 infection and acute CHF. Currently respiratory status is stable.  On room air  Covid infection Currently on room air Finished remdesivir CRP trended down Steroid being tapered   Acute on chronic systolic chf with h/o  ischemic cardiomyopathy with a Medtronic ICD.  -Echocardiogram  05/28/2021 with EF of 30 to 35%. -Cxr with pulmonary edema on presentation -On oral Lasix  -Continue metoprolol -Home meds Acei held , bp soft   CAD  s/p CABG, H/o  mitral and tricuspid annuloplasty rings at the time of her CABG.   On metoprolol/eliquis/statin     permanent atrial fibrillation  on metoprolol /eliquis S/P pacemaker   Noninsulin dependent dm2 with hyperglycemia, likely steroids induced  On insulin currently Continue current regiment   CKDIII a/anemia of chronic disease Hgb close to baseline    Hematuria,  Observed on on 11/27 -UA with many bacteria, urine culture showed insignificant growth, she was on ceftriaxone now discontinued   FTT  PT recommended snf, plan to d/c on 12/1 after 10day out of covid isolation  seen by palliative care,remains full code      Nutrition Problem: Severe Malnutrition Etiology: chronic illness (CHF)      DVT prophylaxis:Eliquis Code Status: Full Family Communication: None at bedside Patient status:Inpatient  Dispo: The patient is from: Home              Anticipated d/c is to: SNF              Anticipated d/c date is: 12/1  Consultants: Palliative care  Procedures: None  Antimicrobials:  Anti-infectives (From admission, onward)    Start     Dose/Rate Route Frequency Ordered Stop   06/02/21 1600  cefTRIAXone (ROCEPHIN) 1 g in sodium chloride 0.9 % 100 mL IVPB  Status:  Discontinued        1 g 200 mL/hr over 30 Minutes Intravenous Daily 06/02/21 1449 06/04/21 1345   05/28/21 2200  levofloxacin (LEVAQUIN) IVPB 500 mg        500 mg 100  mL/hr over 60 Minutes Intravenous Every 48 hours 05/26/21 2032 05/30/21 2245   05/27/21 1000  remdesivir 100 mg in sodium chloride 0.9 % 100 mL IVPB        100 mg 200 mL/hr over 30 Minutes Intravenous Daily 05/26/21 1507 05/30/21 1050   05/26/21 2200  levofloxacin (LEVAQUIN) IVPB 750 mg  Status:  Discontinued        750 mg 100 mL/hr over 90 Minutes Intravenous Every 24 hours  05/26/21 2023 05/26/21 2032   05/26/21 2200  levofloxacin (LEVAQUIN) IVPB 750 mg        750 mg 100 mL/hr over 90 Minutes Intravenous  Once 05/26/21 2032 05/26/21 2240   05/26/21 1530  remdesivir 100 mg in sodium chloride 0.9 % 100 mL IVPB        100 mg 200 mL/hr over 30 Minutes Intravenous Every 30 min 05/26/21 1507 05/26/21 1640       Subjective:  Patient seen and examined at the bedside this morning.  Hemodynamically stable.  Sitting on the chair.  Denies any complaints  Objective: Vitals:   06/04/21 1358 06/04/21 1429 06/04/21 2043 06/05/21 0611  BP: 119/63  (!) 106/58 107/67  Pulse: 84 85 84 89  Resp: 18  16 18   Temp: 97.8 F (36.6 C)  98.2 F (36.8 C) 98.2 F (36.8 C)  TempSrc: Oral  Oral Oral  SpO2: 99% 100% 97% 95%  Weight:    41.5 kg  Height:        Intake/Output Summary (Last 24 hours) at 06/05/2021 2683 Last data filed at 06/05/2021 4196 Gross per 24 hour  Intake 480 ml  Output 1800 ml  Net -1320 ml   Filed Weights   06/03/21 0453 06/04/21 0212 06/05/21 0611  Weight: 41.9 kg 39.9 kg 41.5 kg    Examination:   General exam: Very deconditioned, weak, elderly female, thin built HEENT: PERRL Respiratory system:  no wheezes or crackles  Cardiovascular system: S1 & S2 heard, RRR.  Gastrointestinal system: Abdomen is nondistended, soft and nontender. Central nervous system: Alert and oriented Extremities: No edema, no clubbing ,no cyanosis Skin: No rashes, no ulcers,no icterus    Data Reviewed: I have personally reviewed following labs and imaging studies  CBC: Recent Labs  Lab 05/30/21 0424 05/31/21 0340 06/01/21 0415 06/03/21 0428  WBC 11.8* 11.0* 10.5 10.4  NEUTROABS 9.8* 8.7*  --   --   HGB 11.7* 11.5* 11.9* 11.4*  HCT 34.7* 33.6* 34.8* 33.3*  MCV 95.1 93.6 92.6 91.7  PLT 193 211 223 222   Basic Metabolic Panel: Recent Labs  Lab 05/30/21 0424 05/31/21 0340 06/01/21 0415 06/02/21 0418 06/03/21 0428 06/04/21 0411  NA 133* 131* 133*  131* 132* 131*  K 3.2* 4.0 4.0 5.3* 4.4 4.2  CL 101 100 100 97* 96* 97*  CO2 23 26 26 26 28 28   GLUCOSE 89 97 86 172* 109* 87  BUN 42* 43* 44* 44* 46* 46*  CREATININE 0.86 0.93 1.05* 0.99 1.03* 1.07*  CALCIUM 8.2* 8.5* 8.7* 8.5* 8.4* 8.3*  MG 2.2 2.3  --   --   --   --   PHOS 2.6 2.0* 2.8  --   --   --    GFR: Estimated Creatinine Clearance: 22.9 mL/min (A) (by C-G formula based on SCr of 1.07 mg/dL (H)). Liver Function Tests: Recent Labs  Lab 05/30/21 0424 05/31/21 0340 06/01/21 0415 06/02/21 0418 06/03/21 0428  AST 83* 60* 28 35 22  ALT 59* 63* 49*  45* 37  ALKPHOS 41 41 40 41 38  BILITOT 0.8 1.0 0.9 1.0 0.8  PROT 5.8* 5.8* 5.8* 5.9* 5.5*  ALBUMIN 2.8* 2.9* 2.9* 3.0* 2.7*   No results for input(s): LIPASE, AMYLASE in the last 168 hours. No results for input(s): AMMONIA in the last 168 hours. Coagulation Profile: No results for input(s): INR, PROTIME in the last 168 hours. Cardiac Enzymes: No results for input(s): CKTOTAL, CKMB, CKMBINDEX, TROPONINI in the last 168 hours. BNP (last 3 results) No results for input(s): PROBNP in the last 8760 hours. HbA1C: No results for input(s): HGBA1C in the last 72 hours. CBG: Recent Labs  Lab 06/04/21 0743 06/04/21 1140 06/04/21 1700 06/04/21 2046 06/05/21 0726  GLUCAP 78 111* 163* 131* 72   Lipid Profile: No results for input(s): CHOL, HDL, LDLCALC, TRIG, CHOLHDL, LDLDIRECT in the last 72 hours. Thyroid Function Tests: No results for input(s): TSH, T4TOTAL, FREET4, T3FREE, THYROIDAB in the last 72 hours. Anemia Panel: No results for input(s): VITAMINB12, FOLATE, FERRITIN, TIBC, IRON, RETICCTPCT in the last 72 hours. Sepsis Labs: No results for input(s): PROCALCITON, LATICACIDVEN in the last 168 hours.  Recent Results (from the past 240 hour(s))  Resp Panel by RT-PCR (Flu A&B, Covid) Nasopharyngeal Swab     Status: Abnormal   Collection Time: 05/26/21  1:35 PM   Specimen: Nasopharyngeal Swab; Nasopharyngeal(NP) swabs in  vial transport medium  Result Value Ref Range Status   SARS Coronavirus 2 by RT PCR POSITIVE (A) NEGATIVE Final    Comment: CRITICAL RESULT CALLED TO, READ BACK BY AND VERIFIED WITH: KIERRA, RN @ 1245 ON 05/26/2021 BY LBROOKS, MLT (NOTE) SARS-CoV-2 target nucleic acids are DETECTED.  The SARS-CoV-2 RNA is generally detectable in upper respiratory specimens during the acute phase of infection. Positive results are indicative of the presence of the identified virus, but do not rule out bacterial infection or co-infection with other pathogens not detected by the test. Clinical correlation with patient history and other diagnostic information is necessary to determine patient infection status. The expected result is Negative.  Fact Sheet for Patients: EntrepreneurPulse.com.au  Fact Sheet for Healthcare Providers: IncredibleEmployment.be  This test is not yet approved or cleared by the [place] FDA and  has been authorized for detection and/or diagnosis of SARS-CoV-2 by FDA under an Emergency Use Authorization (EUA).  This EUA will remain in effect (m eaning this test can be used) for the duration of  the COVID-19 declaration under Section 564(b)(1) of the Act, 21 U.S.C. section 360bbb-3(b)(1), unless the authorization is terminated or revoked sooner.     Influenza A by PCR NEGATIVE NEGATIVE Final   Influenza B by PCR NEGATIVE NEGATIVE Final    Comment: (NOTE) The Xpert Xpress SARS-CoV-2/FLU/RSV plus assay is intended as an aid in the diagnosis of influenza from Nasopharyngeal swab specimens and should not be used as a sole basis for treatment. Nasal washings and aspirates are unacceptable for Xpert Xpress SARS-CoV-2/FLU/RSV testing.  Fact Sheet for Patients: EntrepreneurPulse.com.au  Fact Sheet for Healthcare Providers: IncredibleEmployment.be  This test is not yet approved or cleared by the [place] FDA and has been authorized for detection and/or diagnosis of SARS-CoV-2 by FDA under an Emergency Use Authorization (EUA). This EUA will remain in effect (meaning this test can be used) for the duration of the COVID-19 declaration under Section 564(b)(1) of the Act, 21 U.S.C. section 360bbb-3(b)(1), unless the authorization is terminated or revoked.  Performed at [facility], [place] [address] [place], [place]  [address]   Culture, blood (routine x 2) Call MD if unable to obtain prior to antibiotics being given     Status: None   Collection Time: 05/26/21  8:55 PM   Specimen: BLOOD  Result Value Ref Range Status   Specimen Description   Final    BLOOD RIGHT ANTECUBITAL Performed at [facility] [address], [place], [place] [address]    Special Requests   Final    BOTTLES DRAWN AEROBIC ONLY Blood Culture adequate volume Performed at [facility] [address] [place], [place] [address]    Culture   Final    NO GROWTH 5 DAYS Performed at [facility], [place] [address] [place], [place] [address]    Report Status 06/01/2021 FINAL  Final  Culture, blood (routine x 2) Call MD if unable to obtain prior to antibiotics being given     Status: None   Collection Time: 05/26/21  8:55 PM   Specimen: BLOOD  Result Value Ref Range Status   Specimen Description   Final    BLOOD BLOOD RIGHT ARM Performed at [facility] [address] [place], [place] [address]    Special Requests   Final    BOTTLES DRAWN AEROBIC ONLY Blood Culture results may not be optimal due to an inadequate volume of blood received in culture bottles Performed at [facility] [address] [place], [place] [address]    Culture   Final    NO GROWTH 5 DAYS Performed at [facility], [place] [address] [place], [place] [address]    Report Status 06/01/2021 FINAL  Final  Urine Culture     Status:  Abnormal   Collection Time: 06/02/21  2:04 PM   Specimen: Urine, Clean Catch  Result Value Ref Range Status   Specimen Description   Final    URINE, CLEAN CATCH Performed at [facility], [place] [address], [place], [place] [address]    Special Requests   Final    NONE Performed at [facility], [place] [address], [place], [place] [address]    Culture (A)  Final    <10,000 COLONIES/mL INSIGNIFICANT GROWTH Performed at [facility] [address], [place], [place] [address]    Report Status 06/03/2021 FINAL  Final         Radiology Studies: No results found.      Scheduled Meds:  (feeding supplement) PROSource Plus  30 mL Oral Daily   apixaban  2.5 mg Oral BID   vitamin C  500 mg Oral Daily   brimonidine  1 drop Right Eye BID   feeding supplement  237 mL Oral Q24H   ferrous sulfate  325 mg Oral Q breakfast   furosemide  20 mg Oral Daily   insulin aspart  0-15 Units Subcutaneous TID WC & HS   insulin aspart  2 Units Subcutaneous TID WC   insulin glargine-yfgn  4 Units Subcutaneous BID   latanoprost  1 drop Both Eyes QHS   linagliptin  5 mg Oral Daily   mouth rinse  15 mL Mouth Rinse BID   melatonin  3 mg Oral QHS   metoprolol tartrate  12.5 mg Oral BID   multivitamin with minerals  1 tablet Oral Daily   Netarsudil Dimesylate  1 drop Both Eyes QHS   polyethylene glycol  17 g Oral Daily   pravastatin  80 mg Oral Daily  predniSONE  20 mg Oral Daily   senna-docusate  1 tablet Oral BID   zinc sulfate  220 mg Oral Daily   Continuous Infusions:     LOS: 9 days    Time spent:25 mins, More than 50% of that time was spent in counseling and/or coordination of care.      Shelly Coss, MD [facility] P11/30/2022, 8:21 AM

## 2021-06-05 NOTE — TOC Progression Note (Signed)
Transition of Care [facility]) - Progression Note    Patient Details  Name: Amy Wolf MRN: [id number] Date of Birth: July 04, 1928  [facility] CM/SW Contact  Purcell Mouton, RN Phone Number: 06/05/2021, 2:54 PM  Clinical Narrative:  Insurance Auth was started at 9:15AM for SNF.   Expected Discharge Plan: [place] Barriers to Discharge: No Barriers Identified  Expected Discharge Plan and Services Expected Discharge Plan: [place] arrangements for the past 2 months: Single Family Home                                       Social Determinants of Health (SDOH) Interventions    Readmission Risk Interventions No flowsheet data found.

## 2021-06-06 DIAGNOSIS — R41841 Cognitive communication deficit: Secondary | ICD-10-CM | POA: Diagnosis not present

## 2021-06-06 DIAGNOSIS — D72829 Elevated white blood cell count, unspecified: Secondary | ICD-10-CM | POA: Diagnosis not present

## 2021-06-06 DIAGNOSIS — Z743 Need for continuous supervision: Secondary | ICD-10-CM | POA: Diagnosis not present

## 2021-06-06 DIAGNOSIS — U071 COVID-19: Secondary | ICD-10-CM | POA: Diagnosis not present

## 2021-06-06 DIAGNOSIS — Z95810 Presence of automatic (implantable) cardiac defibrillator: Secondary | ICD-10-CM | POA: Diagnosis not present

## 2021-06-06 DIAGNOSIS — R2681 Unsteadiness on feet: Secondary | ICD-10-CM | POA: Diagnosis not present

## 2021-06-06 DIAGNOSIS — R32 Unspecified urinary incontinence: Secondary | ICD-10-CM | POA: Diagnosis not present

## 2021-06-06 DIAGNOSIS — Z951 Presence of aortocoronary bypass graft: Secondary | ICD-10-CM | POA: Diagnosis not present

## 2021-06-06 DIAGNOSIS — I2510 Atherosclerotic heart disease of native coronary artery without angina pectoris: Secondary | ICD-10-CM | POA: Diagnosis not present

## 2021-06-06 DIAGNOSIS — E785 Hyperlipidemia, unspecified: Secondary | ICD-10-CM | POA: Diagnosis not present

## 2021-06-06 DIAGNOSIS — Z950 Presence of cardiac pacemaker: Secondary | ICD-10-CM | POA: Diagnosis not present

## 2021-06-06 DIAGNOSIS — E559 Vitamin D deficiency, unspecified: Secondary | ICD-10-CM | POA: Diagnosis not present

## 2021-06-06 DIAGNOSIS — I739 Peripheral vascular disease, unspecified: Secondary | ICD-10-CM | POA: Diagnosis not present

## 2021-06-06 DIAGNOSIS — I5032 Chronic diastolic (congestive) heart failure: Secondary | ICD-10-CM | POA: Diagnosis not present

## 2021-06-06 DIAGNOSIS — D529 Folate deficiency anemia, unspecified: Secondary | ICD-10-CM | POA: Diagnosis not present

## 2021-06-06 DIAGNOSIS — E1122 Type 2 diabetes mellitus with diabetic chronic kidney disease: Secondary | ICD-10-CM | POA: Diagnosis not present

## 2021-06-06 DIAGNOSIS — G4700 Insomnia, unspecified: Secondary | ICD-10-CM | POA: Diagnosis not present

## 2021-06-06 DIAGNOSIS — E169 Disorder of pancreatic internal secretion, unspecified: Secondary | ICD-10-CM | POA: Diagnosis not present

## 2021-06-06 DIAGNOSIS — R5381 Other malaise: Secondary | ICD-10-CM | POA: Diagnosis not present

## 2021-06-06 DIAGNOSIS — K5900 Constipation, unspecified: Secondary | ICD-10-CM | POA: Diagnosis not present

## 2021-06-06 DIAGNOSIS — I5022 Chronic systolic (congestive) heart failure: Secondary | ICD-10-CM | POA: Diagnosis not present

## 2021-06-06 DIAGNOSIS — R82998 Other abnormal findings in urine: Secondary | ICD-10-CM | POA: Diagnosis not present

## 2021-06-06 DIAGNOSIS — Z8616 Personal history of COVID-19: Secondary | ICD-10-CM | POA: Diagnosis not present

## 2021-06-06 DIAGNOSIS — I130 Hypertensive heart and chronic kidney disease with heart failure and stage 1 through stage 4 chronic kidney disease, or unspecified chronic kidney disease: Secondary | ICD-10-CM | POA: Diagnosis not present

## 2021-06-06 DIAGNOSIS — M6281 Muscle weakness (generalized): Secondary | ICD-10-CM | POA: Diagnosis not present

## 2021-06-06 DIAGNOSIS — E43 Unspecified severe protein-calorie malnutrition: Secondary | ICD-10-CM | POA: Diagnosis not present

## 2021-06-06 DIAGNOSIS — E871 Hypo-osmolality and hyponatremia: Secondary | ICD-10-CM | POA: Diagnosis not present

## 2021-06-06 DIAGNOSIS — N390 Urinary tract infection, site not specified: Secondary | ICD-10-CM | POA: Diagnosis not present

## 2021-06-06 DIAGNOSIS — R7989 Other specified abnormal findings of blood chemistry: Secondary | ICD-10-CM | POA: Diagnosis not present

## 2021-06-06 DIAGNOSIS — D649 Anemia, unspecified: Secondary | ICD-10-CM | POA: Diagnosis not present

## 2021-06-06 DIAGNOSIS — E119 Type 2 diabetes mellitus without complications: Secondary | ICD-10-CM | POA: Diagnosis not present

## 2021-06-06 DIAGNOSIS — R42 Dizziness and giddiness: Secondary | ICD-10-CM | POA: Diagnosis not present

## 2021-06-06 DIAGNOSIS — I10 Essential (primary) hypertension: Secondary | ICD-10-CM | POA: Diagnosis not present

## 2021-06-06 DIAGNOSIS — J9601 Acute respiratory failure with hypoxia: Secondary | ICD-10-CM | POA: Diagnosis not present

## 2021-06-06 DIAGNOSIS — I4819 Other persistent atrial fibrillation: Secondary | ICD-10-CM | POA: Diagnosis not present

## 2021-06-06 DIAGNOSIS — I4891 Unspecified atrial fibrillation: Secondary | ICD-10-CM | POA: Diagnosis not present

## 2021-06-06 DIAGNOSIS — R410 Disorientation, unspecified: Secondary | ICD-10-CM | POA: Diagnosis not present

## 2021-06-06 DIAGNOSIS — E876 Hypokalemia: Secondary | ICD-10-CM | POA: Diagnosis not present

## 2021-06-06 DIAGNOSIS — I4811 Longstanding persistent atrial fibrillation: Secondary | ICD-10-CM | POA: Diagnosis not present

## 2021-06-06 DIAGNOSIS — M1990 Unspecified osteoarthritis, unspecified site: Secondary | ICD-10-CM | POA: Diagnosis not present

## 2021-06-06 DIAGNOSIS — D631 Anemia in chronic kidney disease: Secondary | ICD-10-CM | POA: Diagnosis not present

## 2021-06-06 DIAGNOSIS — D638 Anemia in other chronic diseases classified elsewhere: Secondary | ICD-10-CM | POA: Diagnosis not present

## 2021-06-06 DIAGNOSIS — H409 Unspecified glaucoma: Secondary | ICD-10-CM | POA: Diagnosis not present

## 2021-06-06 DIAGNOSIS — I255 Ischemic cardiomyopathy: Secondary | ICD-10-CM | POA: Diagnosis not present

## 2021-06-06 DIAGNOSIS — N1831 Chronic kidney disease, stage 3a: Secondary | ICD-10-CM | POA: Diagnosis not present

## 2021-06-06 DIAGNOSIS — R630 Anorexia: Secondary | ICD-10-CM | POA: Diagnosis not present

## 2021-06-06 DIAGNOSIS — D519 Vitamin B12 deficiency anemia, unspecified: Secondary | ICD-10-CM | POA: Diagnosis not present

## 2021-06-06 DIAGNOSIS — R1312 Dysphagia, oropharyngeal phase: Secondary | ICD-10-CM | POA: Diagnosis not present

## 2021-06-06 DIAGNOSIS — N189 Chronic kidney disease, unspecified: Secondary | ICD-10-CM | POA: Diagnosis not present

## 2021-06-06 LAB — GLUCOSE, CAPILLARY
Glucose-Capillary: 91 mg/dL (ref 70–99)
Glucose-Capillary: 144 mg/dL — ABNORMAL HIGH (ref 70–99)

## 2021-06-06 MED ORDER — GUAIFENESIN-DM 100-10 MG/5ML PO SYRP: 10.0000 mL | ORAL_SOLUTION | Freq: Four times a day (QID) | ORAL | 0 refills | Status: DC | PRN

## 2021-06-06 MED ORDER — METOPROLOL TARTRATE 25 MG PO TABS: 12.5000 mg | ORAL_TABLET | Freq: Two times a day (BID) | ORAL | Status: DC

## 2021-06-06 MED ORDER — SENNOSIDES-DOCUSATE SODIUM 8.6-50 MG PO TABS: 1.0000 | ORAL_TABLET | Freq: Two times a day (BID) | ORAL | Status: DC

## 2021-06-06 MED ORDER — FUROSEMIDE 20 MG PO TABS: 20.0000 mg | ORAL_TABLET | Freq: Every day | ORAL | Status: DC

## 2021-06-06 MED ORDER — POLYETHYLENE GLYCOL 3350 17 G PO PACK: 17.0000 g | PACK | Freq: Every day | ORAL | 0 refills | Status: DC

## 2021-06-06 MED ORDER — PREDNISONE 10 MG PO TABS
10.0000 mg | ORAL_TABLET | Freq: Every day | ORAL | 0 refills | Status: AC
Start: 2021-06-07 — End: 2021-06-09

## 2021-06-06 NOTE — Progress Notes (Signed)
Received report from Hilliard Clark, RN. Assuming care of patient

## 2021-06-06 NOTE — Progress Notes (Signed)
Occupational Therapy Treatment Patient Details Name: Amy Wolf MRN: [id number] DOB: 04-05-1928 Today's Date: 06/06/2021   History of present illness 87 y.o. female with medical history significant of permanent atrial fibrillation, pacemaker placement, coronary artery disease, hypertension, CAD/CABG, chronic systolic heart failure, stage IIIa CKD, type II DM, glaucoma, osteoarthritis, hyperlipidemia, migraine headaches, urinary incontinence who is coming with generalized weakness and dyspnea . She was febrile, and hypoxic. COVID 19 positive. Pt admitetd with Acute respiratory failure with hypoxia probably secondary to a combination of COVID-19 infection and acute CHF.   OT comments  Patient progressing slowly but steadily and showed improved sitting balance during EOB ADLs this session with supervision once needed compared to previous session where pt did need occasional assistance to balance at EOB.  Pt also more willing to attempt use of RW to pivot feet, and had some success with pivoting RT foot but unable to move LT foot except for one very small pivot and pt required lowering back to recliner and a Max-Total Assist bear hug t/f back to bed.  Patient remains limited by anxiety and fear of falling with self-limiting behaviors,  generalized weakness and decreased activity tolerance along with deficits noted below. Pt continues to demonstrate fair rehab potential and would benefit from continued skilled OT to increase safety and independence with ADLs and functional transfers to allow pt to return home safely and reduce caregiver burden and fall risk. Pt may also benefit from psychiatric consult to address self-limiting due to fear.     Recommendations for follow up therapy are one component of a multi-disciplinary discharge planning process, led by the attending physician.  Recommendations may be updated based on patient status, additional functional criteria and insurance authorization.    Follow Up  Recommendations  Skilled nursing-short term rehab (<3 hours/day)    Assistance Recommended at Discharge Frequent or constant Supervision/Assistance  Equipment Recommendations  Other (comment) (Ae/hipkit)    Recommendations for Other Services      Precautions / Restrictions Precautions Precautions: Fall Precaution Comments: does not control legs when standing--slide out Restrictions Weight Bearing Restrictions: No       Mobility Bed Mobility Overal bed mobility: Needs Assistance Bed Mobility: Sit to Supine       Sit to supine: Max assist   General bed mobility comments: Assist for trunk and LEs. Increased time. Cues provided for sequencing but pt having difficulty following instructions and ultimately required Max As of 1 to return to supine.    Transfers Overall transfer level: Needs assistance Equipment used: Rolling walker (2 wheels) Transfers: Sit to/from Stand;Bed to chair/wheelchair/BSC Sit to Stand: Mod assist Stand pivot transfers: Max assist         General transfer comment: Pt made two attempts to pivot to EOB with RW, but unable to move LLE. Pt could pivot Rt foot but not take a step. Pt stood from recliner x 2 with RW to attmpt pivot but returned to recliner due to inability to step or pivot LT foot. Bear hug transfer from recliner to EOB with Max-Total As of 1 person.  Max-Total assist to lower to sitting. Pt stood 3rd time from EOB for peri care with Mod Assist to RW.     Balance Overall balance assessment: Needs assistance;History of Falls Sitting-balance support: Feet supported;No upper extremity supported Sitting balance-Leahy Scale: Fair     Standing balance support: Bilateral upper extremity supported;Reliant on assistive device for balance Standing balance-Leahy Scale: Poor Standing balance comment: Requires BUE support and external assistance. Posterior  lean.                           ADL either performed or assessed with clinical  judgement   ADL       Grooming: Supervision/safety;Cueing for safety;Wash/dry face;Oral care Grooming Details (indicate cue type and reason): EOB: Pt performed oral care and washed face with setup and close supervision with cues to lean forward and make a lap to avoid posterior lob and sliding off bed.  Pt reported, "That's happened to me before. I went back and slid off the bed."  Reiterrated importance of making a lap whenever sitting to increase stability and safety. Pt followed well and maintained EOB sitting balance without UE support with supervision.                   Toilet Transfer Details (indicate cue type and reason): Pt declined moving to [facility] after pivoting from recliner to EOB. Performed hygiene standing from bed. See below. Toileting- Clothing Manipulation and Hygiene: Total assistance Toileting - Clothing Manipulation Details (indicate cue type and reason): Pt using pure wick in chair. Once standing pt found to have soiled pad. Pt stood from EOB with Max As and able to support self on RW with increased time to adjust to upright standing. Pt then required Total Assist for peri hygiene.  Pt seemed to be voiding her bowels during care but declined BSC. RN notified once pt in bed.     Functional mobility during ADLs: Maximal assistance;Rolling walker (2 wheels);Cueing for safety;Cueing for sequencing      Extremity/Trunk Assessment Upper Extremity Assessment Upper Extremity Assessment: Generalized weakness   Lower Extremity Assessment Lower Extremity Assessment: Generalized weakness   Cervical / Trunk Assessment Cervical / Trunk Assessment: Kyphotic    Vision Patient Visual Report: No change from baseline Additional Comments: RT eye blindess   Perception     Praxis      Cognition Arousal/Alertness: Awake/alert Behavior During Therapy: Flat affect;Anxious Overall Cognitive Status: No family/caregiver present to determine baseline cognitive functioning                                  General Comments: Alert and Oriented x4.          Exercises     Shoulder Instructions       General Comments      Pertinent Vitals/ Pain       Faces Pain Scale: No hurt Breathing: normal Negative Vocalization: none Facial Expression: sad, frightened, frown Body Language: tense, distressed pacing, fidgeting Consolability: distracted or reassured by voice/touch PAINAD Score: 3 Pain Location: Pt denied pain, but very anxious and fearful of falling.  Home Living                                          Prior Functioning/Environment              Frequency  Min 2X/week        Progress Toward Goals  OT Goals(current goals can now be found in the care plan section)  Progress towards OT goals: Progressing toward goals  Acute Rehab OT Goals Patient Stated Goal: Not to fall, "It's my greatest fear." Pt repeating this throughout session. OT Goal Formulation: With patient Time For Goal Achievement: 06/11/21 Potential  to Achieve Goals: [place] Discharge plan remains appropriate    Co-evaluation                 AM-PAC OT "6 Clicks" Daily Activity     Outcome Measure   Help from another person eating meals?: None Help from another person taking care of personal grooming?: A Little Help from another person toileting, which includes using toliet, bedpan, or urinal?: Total Help from another person bathing (including washing, rinsing, drying)?: A Lot Help from another person to put on and taking off regular upper body clothing?: A Little Help from another person to put on and taking off regular lower body clothing?: A Lot 6 Click Score: 15    End of Session Equipment Utilized During Treatment: Gait belt;Rolling walker (2 wheels)  OT Visit Diagnosis: Unsteadiness on feet (R26.81);Other symptoms and signs involving cognitive function;Muscle weakness (generalized) (M62.81)   Activity Tolerance Patient  limited by fatigue   Patient Left in bed;with call bell/phone within reach;with bed alarm set   Nurse Communication Mobility status;Other (comment) (May be having bowel movement in bed. RN into room to check on pt.)        Time: 9450-3888 OT Time Calculation (min): 42 min  Charges: OT General Charges $OT Visit: 1 Visit OT Treatments $Self Care/Home Management : 8-22 mins $Therapeutic Activity: 23-37 mins  Anderson Malta, [place] Office: [phone] 06/06/2021 Julien Girt 06/06/2021, 2:38 PM

## 2021-06-06 NOTE — Discharge Summary (Signed)
Physician Discharge Summary  Amy Wolf NUU:[id number] DOB: 12/03/1927 DOA: 05/26/2021  PCP: Fredirick Lathe, PA-C  Admit date: 05/26/2021 Discharge date: 06/06/2021  Admitted From: Home Disposition:  SNF  Discharge Condition:Stable CODE STATUS:FULL Diet recommendation: Carb Modified    Brief/Interim Summary:  Patient is a 87 year old female with PMH of hypertension, HLD, noninsulin dependent diabetes, coronary artery disease s/p CABG with mitral and tricuspid annuloplasty rings at that time, ischemic cardiomyopathy with Medtronic ICD, paroxysmal atrial fibrillation, ckdIII, generalized weakness and dyspnea . She was febrile, and hypoxic. COVID 19 positive. CXR showed cardiomegaly with central pulm vessels prominence suggesting CHF. There were increased interstitial and alveolar markings in the perihilar regions and lower lung fields, more so on the right side suggesting asymmetric pulmonary edema or bilateral pneumonia.  Patient was admitted and was managed for COVID infection/possible acute congestive heart failure exacerbation.  Currently hemodynamically stable.She has been on room air since last several days.  PT/OT recommended skilled nursing facility on discharge.  Medically stable for discharge today.  Following problems were addressed during her hospitalization:  Acute respiratory failure with hypoxia  Probably secondary to a combination of COVID-19 infection and acute CHF. Currently respiratory status is stable.  On room air   Covid infection Currently on room air Finished remdesivir CRP trended down Steroid being tapered   Acute on chronic systolic chf with h/o  ischemic cardiomyopathy with a Medtronic ICD.  -Echocardiogram 05/28/2021 with EF of 30 to 35%. -Cxr with pulmonary edema on presentation -On oral Lasix  -Continue metoprolol -Home meds Acei stopped , bp soft   CAD  s/p CABG, H/o  mitral and tricuspid annuloplasty rings at the time of her CABG.   On  metoprolol/eliquis/statin    permanent atrial fibrillation  on metoprolol /eliquis S/P pacemaker Rate controlled   Noninsulin dependent dm2 with hyperglycemia, likely steroids induced  Recent hemoglobin A1c in the range of 6.  Continue glipizide   CKDIII a/anemia of chronic disease Hgb close to baseline    Hematuria,  -Resolved -UA with many bacteria, urine culture showed insignificant growth, she was on ceftriaxone now discontinued   Weakness/deconditioning On admission normal         Discharge Diagnoses:  Principal Problem:   Acute respiratory failure with hypoxia (HCC) Active Problems:   Benign essential HTN   Type 2 diabetes mellitus with diabetic chronic kidney disease (HCC)   CAD (coronary artery disease)   Glaucoma   Persistent atrial fibrillation (HCC)   Hypokalemia   Acute respiratory disease due to COVID-19 virus   Chronic systolic CHF (congestive heart failure) (HCC)   Protein-calorie malnutrition, severe    Discharge Instructions  Discharge Instructions     Diet - low sodium heart healthy   Complete by: As directed    Diet Carb Modified   Complete by: As directed    Discharge instructions   Complete by: As directed    1)Please take your medications as instructed   Increase activity slowly   Complete by: As directed       Allergies as of 06/06/2021       Reactions   Iodine Other (See Comments)   Erythromycin Rash   Penicillins Rash   Sulfa Antibiotics Rash        Medication List     STOP taking these medications    ramipril 2.5 MG capsule Commonly known as: ALTACE       TAKE these medications    acetaminophen 500 MG tablet Commonly known  as: TYLENOL Take 500 mg by mouth in the morning and at bedtime.   bimatoprost 0.01 % Soln Commonly known as: LUMIGAN Place 1 drop into both eyes at bedtime.   brimonidine 0.15 % ophthalmic solution Commonly known as: ALPHAGAN Place 1 drop into the right eye 2 (two) times daily.    Eliquis 2.5 MG Tabs tablet Generic drug: apixaban TAKE 1 TABLET BY MOUTH TWICE A DAY   ferrous sulfate 325 (65 FE) MG tablet Take 325 mg by mouth daily with breakfast.   furosemide 20 MG tablet Commonly known as: LASIX Take 1 tablet (20 mg total) by mouth daily. Start taking on: June 07, 2021   glipiZIDE 2.5 MG 24 hr tablet Commonly known as: GLUCOTROL XL TAKE 1 TABLET BY MOUTH EVERY DAY WITH BREAKFAST What changed: See the new instructions.   guaiFENesin-dextromethorphan 100-10 MG/5ML syrup Commonly known as: ROBITUSSIN DM Take 10 mLs by mouth every 6 (six) hours as needed for cough.   Melatonin 3 MG Caps Take 3 mg by mouth at bedtime.   metoprolol tartrate 25 MG tablet Commonly known as: LOPRESSOR Take 0.5 tablets (12.5 mg total) by mouth 2 (two) times daily. What changed:  medication strength how much to take   polyethylene glycol 17 g packet Commonly known as: MIRALAX / GLYCOLAX Take 17 g by mouth daily. Start taking on: June 07, 2021   pravastatin 80 MG tablet Commonly known as: PRAVACHOL TAKE 1 TABLET BY MOUTH EVERY DAY   predniSONE 10 MG tablet Commonly known as: DELTASONE Take 1 tablet (10 mg total) by mouth daily for 2 days. Start taking on: June 07, 2021   Rhopressa 0.02 % Soln Generic drug: Netarsudil Dimesylate Place 1 drop into both eyes at bedtime.   senna-docusate 8.6-50 MG tablet Commonly known as: Senokot-S Take 1 tablet by mouth 2 (two) times daily.        Contact information for after-discharge care     Destination     [facility] .   Service: Skilled Nursing Contact information: [address] [place] [address] [phone]                    Allergies  Allergen Reactions   Iodine Other (See Comments)   Erythromycin Rash   Penicillins Rash   Sulfa Antibiotics Rash    Consultations: None   Procedures/Studies: DG Chest Port 1  View  Result Date: 05/26/2021 CLINICAL DATA:  Shortness of breath EXAM: PORTABLE CHEST 1 VIEW COMPARISON:  None. FINDINGS: Transverse diameter of heart is increased. There is evidence of previous coronary bypass surgery. Pacemaker/defibrillator battery is seen in the left infraclavicular region. Central pulmonary vessels are prominent. Increased interstitial and alveolar markings are seen in parahilar regions and lower lung fields, more so on the right side. There is blunting of left lateral CP angle. There is no pneumothorax. IMPRESSION: Cardiomegaly. Central pulmonary vessels are prominent suggesting CHF. Increased interstitial and alveolar markings are seen in the parahilar regions and lower lung fields, more so on the right side suggesting asymmetric pulmonary edema or bilateral pneumonia. Electronically Signed   By: Elmer Picker M.D.   On: 05/26/2021 14:17   ECHOCARDIOGRAM COMPLETE  Result Date: 05/27/2021    ECHOCARDIOGRAM REPORT   Patient Name:   Amy Wolf  Date of Exam: 05/27/2021 Medical Rec #:  [id number]  Height:       67.0 in Accession #:    [id number] Weight:  94.4 lb Date of Birth:  07-Oct-1927  BSA:          1.470 m Patient Age:    89 years   BP:           100/57 mmHg Patient Gender: F          HR:           82 bpm. Exam Location:  Inpatient Procedure: 2D Echo, Cardiac Doppler, Color Doppler and 3D Echo Indications:    CHF-Acute Systolic T55.73  History:        Patient has prior history of Echocardiogram examinations, most                 recent 04/12/2019. CHF, CAD, Prior CABG and Defibrillator, PAD,                 Arrythmias:Alternating bundle branch blocks.,                 Signs/Symptoms:Fever; Risk Factors:Diabetes, Dyslipidemia and                 Hypertension. Covid. Chronic kidney disease                 Tricuspid Valve Annular Ring 2007.                  Mitral Valve: prosthetic annuloplasty ring valve is present in                 the mitral position. Procedure Date: 2007.   Sonographer:    Darlina Sicilian RDCS Referring Phys: [id number] DAVID MANUEL [place]  1. Left ventricular ejection fraction, by estimation, is 30 to 35%. Left ventricular ejection fraction by 3D volume is 30 %. The left ventricle has moderately decreased function. The left ventricle demonstrates global hypokinesis. There is moderate asymmetric left ventricular hypertrophy of the basal-septal segment. Left ventricular diastolic function could not be evaluated.  2. Right ventricular systolic function is mildly reduced. The right ventricular size is normal. There is moderately elevated pulmonary artery systolic pressure. The estimated right ventricular systolic pressure is 70.6 mmHg.  3. Left atrial size was moderately dilated.  4. The mitral valve has been repaired/replaced. Trivial mitral valve regurgitation. No evidence of mitral stenosis. The mean mitral valve gradient is 3.0 mmHg. There is a prosthetic annuloplasty ring present in the mitral position. Procedure Date: 2007.  5. The tricuspid valve is has been repaired/replaced. The tricuspid valve is status post repair with an annuloplasty ring. Tricuspid valve regurgitation is mild to moderate.  6. The aortic valve is tricuspid. Aortic valve regurgitation is not visualized.  7. The inferior vena cava is normal in size with <50% respiratory variability, suggesting right atrial pressure of 8 mmHg. Comparison(s): No significant change from prior study. 04/12/2019: LVEF 30%, global HK, RVSP 53 mmHg. FINDINGS  Left Ventricle: Left ventricular ejection fraction, by estimation, is 30 to 35%. Left ventricular ejection fraction by 3D volume is 30 %. The left ventricle has moderately decreased function. The left ventricle demonstrates global hypokinesis. The left ventricular internal cavity size was normal in size. There is moderate asymmetric left ventricular hypertrophy of the basal-septal segment. Left ventricular diastolic function could not be evaluated due to  atrial fibrillation. Left ventricular diastolic function could not be evaluated. Right Ventricle: The right ventricular size is normal. No increase in right ventricular wall thickness. Right ventricular systolic function is mildly reduced. There is moderately elevated pulmonary artery systolic pressure. The tricuspid regurgitant velocity is  3.22 m/s, and with an assumed right atrial pressure of 8 mmHg, the estimated right ventricular systolic pressure is 00.1 mmHg. Left Atrium: Left atrial size was moderately dilated. Right Atrium: Right atrial size was normal in size. Pericardium: There is no evidence of pericardial effusion. Mitral Valve: The mitral valve has been repaired/replaced. Trivial mitral valve regurgitation. There is a prosthetic annuloplasty ring present in the mitral position. Procedure Date: 2007. No evidence of mitral valve stenosis. MV peak gradient, 7.6 mmHg.  The mean mitral valve gradient is 3.0 mmHg. Tricuspid Valve: The tricuspid valve is has been repaired/replaced. Tricuspid valve regurgitation is mild to moderate. The tricuspid valve is status post repair with an annuloplasty ring. Aortic Valve: The aortic valve is tricuspid. Aortic valve regurgitation is not visualized. Pulmonic Valve: The pulmonic valve was grossly normal. Pulmonic valve regurgitation is not visualized. Aorta: The aortic root and ascending aorta are structurally normal, with no evidence of dilitation. Venous: The inferior vena cava is normal in size with less than 50% respiratory variability, suggesting right atrial pressure of 8 mmHg. IAS/Shunts: No atrial level shunt detected by color flow Doppler. Additional Comments: A device lead is visualized.  LEFT VENTRICLE PLAX 2D LVIDd:         4.50 cm LVIDs:         3.80 cm LV PW:         0.90 cm         3D Volume EF LV IVS:        1.50 cm         LV 3D EF:    Left LVOT diam:     1.90 cm                      ventricul LV SV:         30                           ar LV SV Index:    20                           ejection LVOT Area:     2.84 cm                     fraction                                             by 3D                                             volume is                                             30 %.                                 3D Volume EF:  3D EF:        30 %                                LV EDV:       133 ml                                LV ESV:       94 ml                                LV SV:        39 ml RIGHT VENTRICLE RV S prime:     7.03 cm/s LEFT ATRIUM             Index        RIGHT ATRIUM           Index LA diam:        4.80 cm 3.27 cm/m   RA Area:     14.20 cm LA Vol (A2C):   47.3 ml 32.19 ml/m  RA Volume:   34.00 ml  23.14 ml/m LA Vol (A4C):   57.6 ml 39.20 ml/m LA Biplane Vol: 54.0 ml 36.75 ml/m  AORTIC VALVE LVOT Vmax:   62.00 cm/s LVOT Vmean:  39.233 cm/s LVOT VTI:    0.106 m  AORTA Ao Root diam: 3.00 cm Ao Asc diam:  2.90 cm MITRAL VALVE                TRICUSPID VALVE MV Area (PHT): 3.31 cm     TV Peak grad:   3.3 mmHg MV Area VTI:   1.09 cm     TV Mean grad:   1.7 mmHg MV Peak grad:  7.6 mmHg     TV Vmax:        0.90 m/s MV Mean grad:  3.0 mmHg     TV Vmean:       59.4 cm/s MV Vmax:       1.38 m/s     TV VTI:         0.20 msec MV Vmean:      81.0 cm/s    TR Peak grad:   41.5 mmHg MV Decel Time: 229 msec     TR Vmax:        322.00 cm/s MV E velocity: 104.37 cm/s                             SHUNTS                             Systemic VTI:  0.11 m                             Systemic Diam: 1.90 cm Lyman Bishop MD Electronically signed by Lyman Bishop MD Signature Date/Time: 05/27/2021/12:14:25 PM    Final       Subjective: Patient seen and examined at the bedside this morning.  Hemodynamically stable for discharge today.  Discharge Exam: Vitals:   06/06/21 1011 06/06/21 1328  BP: 123/76 131/72  Pulse: 86 86  Resp:  16  Temp:  98.3 F (36.8 C)  SpO2:  99%   Vitals:   06/06/21 0500 06/06/21 0558  06/06/21 1011 06/06/21 1328  BP:  129/73 123/76 131/72  Pulse:  82 86 86  Resp:  14  16  Temp:  98 F (36.7 C)  98.3 F (36.8 C)  TempSrc:  Oral  Oral  SpO2:  99%  99%  Weight: 41.6 kg     Height:        General: Pt is alert, awake, not in acute distress Cardiovascular: RRR, S1/S2 +, no rubs, no gallops Respiratory: CTA bilaterally, no wheezing, no rhonchi Abdominal: Soft, NT, ND, bowel sounds + Extremities: no edema, no cyanosis    The results of significant diagnostics from this hospitalization (including imaging, microbiology, ancillary and laboratory) are listed below for reference.     Microbiology: Recent Results (from the past 240 hour(s))  Urine Culture     Status: Abnormal   Collection Time: 06/02/21  2:04 PM   Specimen: Urine, Clean Catch  Result Value Ref Range Status   Specimen Description   Final    URINE, CLEAN CATCH Performed at [facility], [place] [address], [place], [place] [address]    Special Requests   Final    NONE Performed at [facility], [place] [address] [place], [place] [address]    Culture (A)  Final    <10,000 COLONIES/mL INSIGNIFICANT GROWTH Performed at [place] [address] [place], [place] [address]    Report Status 06/03/2021 FINAL  Final     Labs: BNP (last 3 results) Recent Labs    05/26/21 1400  BNP 0,086.7*   Basic Metabolic Panel: Recent Labs  Lab 05/31/21 0340 06/01/21 0415 06/02/21 0418 06/03/21 0428 06/04/21 0411  NA 131* 133* 131* 132* 131*  K 4.0 4.0 5.3* 4.4 4.2  CL 100 100 97* 96* 97*  CO2 26 26 26 28 28   GLUCOSE 97 86 172* 109* 87  BUN 43* 44* 44* 46* 46*  CREATININE 0.93 1.05* 0.99 1.03* 1.07*  CALCIUM 8.5* 8.7* 8.5* 8.4* 8.3*  MG 2.3  --   --   --   --   PHOS 2.0* 2.8  --   --   --    Liver Function Tests: Recent Labs  Lab 05/31/21 0340 06/01/21 0415 06/02/21 0418 06/03/21 0428  AST 60* 28 35 22  ALT 63* 49* 45* 37  ALKPHOS 41 40 41 38   BILITOT 1.0 0.9 1.0 0.8  PROT 5.8* 5.8* 5.9* 5.5*  ALBUMIN 2.9* 2.9* 3.0* 2.7*   No results for input(s): LIPASE, AMYLASE in the last 168 hours. No results for input(s): AMMONIA in the last 168 hours. CBC: Recent Labs  Lab 05/31/21 0340 06/01/21 0415 06/03/21 0428  WBC 11.0* 10.5 10.4  NEUTROABS 8.7*  --   --   HGB 11.5* 11.9* 11.4*  HCT 33.6* 34.8* 33.3*  MCV 93.6 92.6 91.7  PLT 211 223 231   Cardiac Enzymes: No results for input(s): CKTOTAL, CKMB, CKMBINDEX, TROPONINI in the last 168 hours. BNP: Invalid input(s): POCBNP CBG: Recent Labs  Lab 06/05/21 1254 06/05/21 1608 06/05/21 2041 06/06/21 0717 06/06/21 1142  GLUCAP 214* 197* 251* 91 144*   D-Dimer No results for input(s): DDIMER in the last 72 hours. Hgb A1c No results for input(s): HGBA1C in the last 72 hours. Lipid Profile No results for input(s): CHOL, HDL, LDLCALC, TRIG, CHOLHDL, LDLDIRECT in the last 72 hours. Thyroid function studies No results for input(s): TSH, T4TOTAL, T3FREE,  THYROIDAB in the last 72 hours.  Invalid input(s): FREET3 Anemia work up No results for input(s): VITAMINB12, FOLATE, FERRITIN, TIBC, IRON, RETICCTPCT in the last 72 hours. Urinalysis    Component Value Date/Time   COLORURINE RED (A) 06/02/2021 1404   APPEARANCEUR HAZY (A) 06/02/2021 1404   LABSPEC 1.005 06/02/2021 1404   PHURINE 6.0 06/02/2021 1404   GLUCOSEU NEGATIVE 06/02/2021 1404   HGBUR MODERATE (A) 06/02/2021 1404   BILIRUBINUR NEGATIVE 06/02/2021 1404   KETONESUR NEGATIVE 06/02/2021 1404   PROTEINUR 100 (A) 06/02/2021 1404   NITRITE NEGATIVE 06/02/2021 1404   LEUKOCYTESUR NEGATIVE 06/02/2021 1404   Sepsis Labs Invalid input(s): PROCALCITONIN,  WBC,  LACTICIDVEN Microbiology Recent Results (from the past 240 hour(s))  Urine Culture     Status: Abnormal   Collection Time: 06/02/21  2:04 PM   Specimen: Urine, Clean Catch  Result Value Ref Range Status   Specimen Description   Final    URINE, CLEAN  CATCH Performed at [facility], [place] [address] [place], [place] [address]    Special Requests   Final    NONE Performed at [facility], [place] [address], [place], [place] [address]    Culture (A)  Final    <10,000 COLONIES/mL INSIGNIFICANT GROWTH Performed at [place] [address], [place], [place] [address]    Report Status 06/03/2021 FINAL  Final    Please note: You were cared for by a hospitalist during your hospital stay. Once you are discharged, your primary care physician will handle any further medical issues. Please note that NO REFILLS for any discharge medications will be authorized once you are discharged, as it is imperative that you return to your primary care physician (or establish a relationship with a primary care physician if you do not have one) for your post hospital discharge needs so that they can reassess your need for medications and monitor your lab values.    Time coordinating discharge: 40 minutes  SIGNED:   Shelly Coss, MD  [facility] 06/06/2021, 2:44 PM Pager [phone]  If 7PM-7AM, please contact night-coverage [email or web address] Password [email or web address]

## 2021-06-06 NOTE — TOC Progression Note (Signed)
Transition of Care [facility]) - Progression Note    Patient Details  Name: Amy Wolf MRN: [id number] Date of Birth: 11-15-1927  Transition of Care [facility]) CM/SW Contact  Purcell Mouton, RN Phone Number: 06/06/2021, 9:53 AM  Clinical Narrative:    A call was made to check on insurance auth NO authorization yet. Started HTA authorization on 05/28/2021.    Expected Discharge Plan: [place] Barriers to Discharge: No Barriers Identified  Expected Discharge Plan and Services Expected Discharge Plan: [place] arrangements for the past 2 months: Single Family Home                                       Social Determinants of Health (SDOH) Interventions    Readmission Risk Interventions No flowsheet data found.

## 2021-06-06 NOTE — Progress Notes (Signed)
Called to give report to R.N. receiving patient at [place].

## 2021-06-06 NOTE — Progress Notes (Signed)
PROGRESS NOTE    Amy Wolf  RFF:[id number] DOB: [birth year] DOA: 05/26/2021 PCP: Fredirick Lathe, PA-C   Chief Complain:SOB  Brief Narrative:  Patient is a 87 year old female with PMH of hypertension, HLD, noninsulin dependent diabetes, coronary artery disease s/p CABG with mitral and tricuspid annuloplasty rings at that time, ischemic cardiomyopathy with Medtronic ICD, paroxysmal atrial fibrillation, ckdIII, generalized weakness and dyspnea . She was febrile, and hypoxic. COVID 19 positive. CXR showed cardiomegaly with central pulm vessels prominence suggesting CHF. There were increased interstitial and alveolar markings in the perihilar regions and lower lung fields, more so on the right side suggesting asymmetric pulmonary edema or bilateral pneumonia.  Patient was admitted and was managed for COVID infection/possible acute congestive heart failure exacerbation.  Currently hemodynamically stable.  Hospital course was remarkable for hematuria.  Currently on antibiotics for possible UTI.  Discharge planning to skilled nursing facility on 12/1.  Medically stable for discharge.  Assessment & Plan:   Principal Problem:   Acute respiratory failure with hypoxia (HCC) Active Problems:   Benign essential HTN   Type 2 diabetes mellitus with diabetic chronic kidney disease (HCC)   CAD (coronary artery disease)   Glaucoma   Persistent atrial fibrillation (HCC)   Hypokalemia   Acute respiratory disease due to COVID-19 virus   Chronic systolic CHF (congestive heart failure) (HCC)   Protein-calorie malnutrition, severe   Acute respiratory failure with hypoxia  Probably secondary to a combination of COVID-19 infection and acute CHF. Currently respiratory status is stable.  On room air  Covid infection Currently on room air Finished remdesivir CRP trended down Steroid being tapered   Acute on chronic systolic chf with h/o  ischemic cardiomyopathy with a Medtronic ICD.  -Echocardiogram  05/28/2021 with EF of 30 to 35%. -Cxr with pulmonary edema on presentation -On oral Lasix  -Continue metoprolol -Home meds Acei held , bp soft   CAD  s/p CABG, H/o  mitral and tricuspid annuloplasty rings at the time of her CABG.   On metoprolol/eliquis/statin     permanent atrial fibrillation  on metoprolol /eliquis S/P pacemaker   Noninsulin dependent dm2 with hyperglycemia, likely steroids induced  On insulin currently Continue current regiment   CKDIII a/anemia of chronic disease Hgb close to baseline    Hematuria,  Observed on on 11/27 -UA with many bacteria, urine culture showed insignificant growth, she was on ceftriaxone now discontinued   FTT  PT recommended snf, plan to d/c on 12/1 after 10day out of covid isolation  seen by palliative care,remains full code      Nutrition Problem: Severe Malnutrition Etiology: chronic illness (CHF)      DVT prophylaxis:Eliquis Code Status: Full Family Communication: None at bedside Patient status:Inpatient  Dispo: The patient is from: Home              Anticipated d/c is to: SNF              Anticipated d/c date is: As soon as bed is available at SNF  Consultants: Palliative care  Procedures: None  Antimicrobials:  Anti-infectives (From admission, onward)    Start     Dose/Rate Route Frequency Ordered Stop   06/02/21 1600  cefTRIAXone (ROCEPHIN) 1 g in sodium chloride 0.9 % 100 mL IVPB  Status:  Discontinued        1 g 200 mL/hr over 30 Minutes Intravenous Daily 06/02/21 1449 06/04/21 1345   05/28/21 2200  levofloxacin (LEVAQUIN) IVPB 500 mg  500 mg 100 mL/hr over 60 Minutes Intravenous Every 48 hours 05/26/21 2032 05/30/21 2245   05/27/21 1000  remdesivir 100 mg in sodium chloride 0.9 % 100 mL IVPB        100 mg 200 mL/hr over 30 Minutes Intravenous Daily 05/26/21 1507 05/30/21 1050   05/26/21 2200  levofloxacin (LEVAQUIN) IVPB 750 mg  Status:  Discontinued        750 mg 100 mL/hr over 90 Minutes  Intravenous Every 24 hours 05/26/21 2023 05/26/21 2032   05/26/21 2200  levofloxacin (LEVAQUIN) IVPB 750 mg        750 mg 100 mL/hr over 90 Minutes Intravenous  Once 05/26/21 2032 05/26/21 2240   05/26/21 1530  remdesivir 100 mg in sodium chloride 0.9 % 100 mL IVPB        100 mg 200 mL/hr over 30 Minutes Intravenous Every 30 min 05/26/21 1507 05/26/21 1640       Subjective:  Patient seen and examined at the bedside this morning.  Remains hemodynamically stable.  Overall comfortable, sitting in the chair.  Not in any kind of distress.  Objective: Vitals:   06/05/21 2000 06/06/21 0500 06/06/21 0558 06/06/21 1011  BP: (!) 151/71  129/73 123/76  Pulse: 81  82 86  Resp:   14   Temp: (!) 97.4 F (36.3 C)  98 F (36.7 C)   TempSrc: Oral  Oral   SpO2: 99%  99%   Weight:  41.6 kg    Height:        Intake/Output Summary (Last 24 hours) at 06/06/2021 1212 Last data filed at 06/06/2021 0700 Gross per 24 hour  Intake --  Output 1125 ml  Net -1125 ml   Filed Weights   06/04/21 0212 06/05/21 0611 06/06/21 0500  Weight: 39.9 kg 41.5 kg 41.6 kg    Examination:   General exam: Very deconditioned, weak, elderly female, thin built HEENT: PERRL Respiratory system:  no wheezes or crackles  Cardiovascular system: S1 & S2 heard, RRR.  Gastrointestinal system: Abdomen is nondistended, soft and nontender. Central nervous system: Alert and oriented Extremities: No edema, no clubbing ,no cyanosis Skin: No rashes, no ulcers,no icterus    Data Reviewed: I have personally reviewed following labs and imaging studies  CBC: Recent Labs  Lab 05/31/21 0340 06/01/21 0415 06/03/21 0428  WBC 11.0* 10.5 10.4  NEUTROABS 8.7*  --   --   HGB 11.5* 11.9* 11.4*  HCT 33.6* 34.8* 33.3*  MCV 93.6 92.6 91.7  PLT 211 223 024   Basic Metabolic Panel: Recent Labs  Lab 05/31/21 0340 06/01/21 0415 06/02/21 0418 06/03/21 0428 06/04/21 0411  NA 131* 133* 131* 132* 131*  K 4.0 4.0 5.3* 4.4 4.2   CL 100 100 97* 96* 97*  CO2 26 26 26 28 28   GLUCOSE 97 86 172* 109* 87  BUN 43* 44* 44* 46* 46*  CREATININE 0.93 1.05* 0.99 1.03* 1.07*  CALCIUM 8.5* 8.7* 8.5* 8.4* 8.3*  MG 2.3  --   --   --   --   PHOS 2.0* 2.8  --   --   --    GFR: Estimated Creatinine Clearance: 22.9 mL/min (A) (by C-G formula based on SCr of 1.07 mg/dL (H)). Liver Function Tests: Recent Labs  Lab 05/31/21 0340 06/01/21 0415 06/02/21 0418 06/03/21 0428  AST 60* 28 35 22  ALT 63* 49* 45* 37  ALKPHOS 41 40 41 38  BILITOT 1.0 0.9 1.0 0.8  PROT 5.8* 5.8*  5.9* 5.5*  ALBUMIN 2.9* 2.9* 3.0* 2.7*   No results for input(s): LIPASE, AMYLASE in the last 168 hours. No results for input(s): AMMONIA in the last 168 hours. Coagulation Profile: No results for input(s): INR, PROTIME in the last 168 hours. Cardiac Enzymes: No results for input(s): CKTOTAL, CKMB, CKMBINDEX, TROPONINI in the last 168 hours. BNP (last 3 results) No results for input(s): PROBNP in the last 8760 hours. HbA1C: No results for input(s): HGBA1C in the last 72 hours. CBG: Recent Labs  Lab 06/05/21 1254 06/05/21 1608 06/05/21 2041 06/06/21 0717 06/06/21 1142  GLUCAP 214* 197* 251* 91 144*   Lipid Profile: No results for input(s): CHOL, HDL, LDLCALC, TRIG, CHOLHDL, LDLDIRECT in the last 72 hours. Thyroid Function Tests: No results for input(s): TSH, T4TOTAL, FREET4, T3FREE, THYROIDAB in the last 72 hours. Anemia Panel: No results for input(s): VITAMINB12, FOLATE, FERRITIN, TIBC, IRON, RETICCTPCT in the last 72 hours. Sepsis Labs: No results for input(s): PROCALCITON, LATICACIDVEN in the last 168 hours.  Recent Results (from the past 240 hour(s))  Urine Culture     Status: Abnormal   Collection Time: 06/02/21  2:04 PM   Specimen: Urine, Clean Catch  Result Value Ref Range Status   Specimen Description   Final    URINE, CLEAN CATCH Performed at [facility], [place] [address] [place], [place] [address]    Special  Requests   Final    NONE Performed at [facility], [place] [address], [place], [place] [address]    Culture (A)  Final    <10,000 COLONIES/mL INSIGNIFICANT GROWTH Performed at [facility] [address] [place], [place] [address]    Report Status 06/03/2021 FINAL  Final         Radiology Studies: No results found.      Scheduled Meds:  (feeding supplement) PROSource Plus  30 mL Oral Daily   apixaban  2.5 mg Oral BID   vitamin C  500 mg Oral Daily   brimonidine  1 drop Right Eye BID   feeding supplement  237 mL Oral Q24H   ferrous sulfate  325 mg Oral Q breakfast   furosemide  20 mg Oral Daily   insulin aspart  0-15 Units Subcutaneous TID WC & HS   insulin aspart  2 Units Subcutaneous TID WC   insulin glargine-yfgn  4 Units Subcutaneous BID   latanoprost  1 drop Both Eyes QHS   linagliptin  5 mg Oral Daily   mouth rinse  15 mL Mouth Rinse BID   melatonin  3 mg Oral QHS   metoprolol tartrate  12.5 mg Oral BID   multivitamin with minerals  1 tablet Oral Daily   Netarsudil Dimesylate  1 drop Both Eyes QHS   polyethylene glycol  17 g Oral Daily   pravastatin  80 mg Oral Daily   predniSONE  10 mg Oral Daily   senna-docusate  1 tablet Oral BID   zinc sulfate  220 mg Oral Daily   Continuous Infusions:     LOS: 10 days    Time spent:25 mins, More than 50% of that time was spent in counseling and/or coordination of care.      Shelly Coss, MD [facility] P12/07/2020, 12:12 PM

## 2021-06-06 NOTE — Care Management Important Message (Signed)
Important Message  Patient Details IM Letter placed in Patients room. Name: Amy Wolf MRN: [id number] Date of Birth: Nov 06, 1927   Medicare Important Message Given:  Yes     Kerin Salen 06/06/2021, 2:02 PM

## 2021-06-10 DIAGNOSIS — E119 Type 2 diabetes mellitus without complications: Secondary | ICD-10-CM | POA: Diagnosis not present

## 2021-06-10 DIAGNOSIS — D638 Anemia in other chronic diseases classified elsewhere: Secondary | ICD-10-CM | POA: Diagnosis not present

## 2021-06-10 DIAGNOSIS — U071 COVID-19: Secondary | ICD-10-CM | POA: Diagnosis not present

## 2021-06-10 DIAGNOSIS — M1990 Unspecified osteoarthritis, unspecified site: Secondary | ICD-10-CM | POA: Diagnosis not present

## 2021-06-10 DIAGNOSIS — I4891 Unspecified atrial fibrillation: Secondary | ICD-10-CM | POA: Diagnosis not present

## 2021-06-10 DIAGNOSIS — K5900 Constipation, unspecified: Secondary | ICD-10-CM | POA: Diagnosis not present

## 2021-06-10 DIAGNOSIS — G4700 Insomnia, unspecified: Secondary | ICD-10-CM | POA: Diagnosis not present

## 2021-06-10 DIAGNOSIS — I255 Ischemic cardiomyopathy: Secondary | ICD-10-CM | POA: Diagnosis not present

## 2021-06-10 DIAGNOSIS — I2510 Atherosclerotic heart disease of native coronary artery without angina pectoris: Secondary | ICD-10-CM | POA: Diagnosis not present

## 2021-06-10 DIAGNOSIS — N189 Chronic kidney disease, unspecified: Secondary | ICD-10-CM | POA: Diagnosis not present

## 2021-06-10 DIAGNOSIS — I5022 Chronic systolic (congestive) heart failure: Secondary | ICD-10-CM | POA: Diagnosis not present

## 2021-06-12 ENCOUNTER — Encounter: Payer: Self-pay | Admitting: Cardiology

## 2021-06-12 DIAGNOSIS — U071 COVID-19: Secondary | ICD-10-CM | POA: Diagnosis not present

## 2021-06-12 DIAGNOSIS — I5022 Chronic systolic (congestive) heart failure: Secondary | ICD-10-CM | POA: Diagnosis not present

## 2021-06-12 DIAGNOSIS — D72829 Elevated white blood cell count, unspecified: Secondary | ICD-10-CM | POA: Diagnosis not present

## 2021-06-12 DIAGNOSIS — N189 Chronic kidney disease, unspecified: Secondary | ICD-10-CM | POA: Diagnosis not present

## 2021-06-12 DIAGNOSIS — G4700 Insomnia, unspecified: Secondary | ICD-10-CM | POA: Diagnosis not present

## 2021-06-12 DIAGNOSIS — K5900 Constipation, unspecified: Secondary | ICD-10-CM | POA: Diagnosis not present

## 2021-06-12 DIAGNOSIS — I255 Ischemic cardiomyopathy: Secondary | ICD-10-CM | POA: Diagnosis not present

## 2021-06-12 DIAGNOSIS — R630 Anorexia: Secondary | ICD-10-CM | POA: Diagnosis not present

## 2021-06-12 DIAGNOSIS — M1990 Unspecified osteoarthritis, unspecified site: Secondary | ICD-10-CM | POA: Diagnosis not present

## 2021-06-12 DIAGNOSIS — I4891 Unspecified atrial fibrillation: Secondary | ICD-10-CM | POA: Diagnosis not present

## 2021-06-12 DIAGNOSIS — E871 Hypo-osmolality and hyponatremia: Secondary | ICD-10-CM | POA: Diagnosis not present

## 2021-06-12 DIAGNOSIS — E119 Type 2 diabetes mellitus without complications: Secondary | ICD-10-CM | POA: Diagnosis not present

## 2021-06-13 DIAGNOSIS — U071 COVID-19: Secondary | ICD-10-CM | POA: Diagnosis not present

## 2021-06-13 DIAGNOSIS — I255 Ischemic cardiomyopathy: Secondary | ICD-10-CM | POA: Diagnosis not present

## 2021-06-13 DIAGNOSIS — I5022 Chronic systolic (congestive) heart failure: Secondary | ICD-10-CM | POA: Diagnosis not present

## 2021-06-13 DIAGNOSIS — M1990 Unspecified osteoarthritis, unspecified site: Secondary | ICD-10-CM | POA: Diagnosis not present

## 2021-06-13 DIAGNOSIS — D72829 Elevated white blood cell count, unspecified: Secondary | ICD-10-CM | POA: Diagnosis not present

## 2021-06-13 DIAGNOSIS — R630 Anorexia: Secondary | ICD-10-CM | POA: Diagnosis not present

## 2021-06-13 DIAGNOSIS — N390 Urinary tract infection, site not specified: Secondary | ICD-10-CM | POA: Diagnosis not present

## 2021-06-13 DIAGNOSIS — G4700 Insomnia, unspecified: Secondary | ICD-10-CM | POA: Diagnosis not present

## 2021-06-13 DIAGNOSIS — E871 Hypo-osmolality and hyponatremia: Secondary | ICD-10-CM | POA: Diagnosis not present

## 2021-06-13 DIAGNOSIS — K5900 Constipation, unspecified: Secondary | ICD-10-CM | POA: Diagnosis not present

## 2021-06-13 DIAGNOSIS — I4891 Unspecified atrial fibrillation: Secondary | ICD-10-CM | POA: Diagnosis not present

## 2021-06-13 DIAGNOSIS — E119 Type 2 diabetes mellitus without complications: Secondary | ICD-10-CM | POA: Diagnosis not present

## 2021-06-13 DIAGNOSIS — I10 Essential (primary) hypertension: Secondary | ICD-10-CM | POA: Diagnosis not present

## 2021-06-15 DIAGNOSIS — E871 Hypo-osmolality and hyponatremia: Secondary | ICD-10-CM | POA: Diagnosis not present

## 2021-06-15 DIAGNOSIS — N189 Chronic kidney disease, unspecified: Secondary | ICD-10-CM | POA: Diagnosis not present

## 2021-06-17 DIAGNOSIS — E871 Hypo-osmolality and hyponatremia: Secondary | ICD-10-CM | POA: Diagnosis not present

## 2021-06-17 DIAGNOSIS — R630 Anorexia: Secondary | ICD-10-CM | POA: Diagnosis not present

## 2021-06-17 DIAGNOSIS — M1990 Unspecified osteoarthritis, unspecified site: Secondary | ICD-10-CM | POA: Diagnosis not present

## 2021-06-17 DIAGNOSIS — R5381 Other malaise: Secondary | ICD-10-CM | POA: Diagnosis not present

## 2021-06-17 DIAGNOSIS — D638 Anemia in other chronic diseases classified elsewhere: Secondary | ICD-10-CM | POA: Diagnosis not present

## 2021-06-17 DIAGNOSIS — G4700 Insomnia, unspecified: Secondary | ICD-10-CM | POA: Diagnosis not present

## 2021-06-17 DIAGNOSIS — I10 Essential (primary) hypertension: Secondary | ICD-10-CM | POA: Diagnosis not present

## 2021-06-17 DIAGNOSIS — D72829 Elevated white blood cell count, unspecified: Secondary | ICD-10-CM | POA: Diagnosis not present

## 2021-06-17 DIAGNOSIS — K5900 Constipation, unspecified: Secondary | ICD-10-CM | POA: Diagnosis not present

## 2021-06-17 DIAGNOSIS — R42 Dizziness and giddiness: Secondary | ICD-10-CM | POA: Diagnosis not present

## 2021-06-17 DIAGNOSIS — I5022 Chronic systolic (congestive) heart failure: Secondary | ICD-10-CM | POA: Diagnosis not present

## 2021-06-17 DIAGNOSIS — U071 COVID-19: Secondary | ICD-10-CM | POA: Diagnosis not present

## 2021-06-18 DIAGNOSIS — R82998 Other abnormal findings in urine: Secondary | ICD-10-CM | POA: Diagnosis not present

## 2021-06-18 DIAGNOSIS — I10 Essential (primary) hypertension: Secondary | ICD-10-CM | POA: Diagnosis not present

## 2021-06-19 ENCOUNTER — Other Ambulatory Visit: Payer: Self-pay | Admitting: Family

## 2021-06-19 DIAGNOSIS — I255 Ischemic cardiomyopathy: Secondary | ICD-10-CM | POA: Diagnosis not present

## 2021-06-19 DIAGNOSIS — K5900 Constipation, unspecified: Secondary | ICD-10-CM | POA: Diagnosis not present

## 2021-06-19 DIAGNOSIS — I5022 Chronic systolic (congestive) heart failure: Secondary | ICD-10-CM | POA: Diagnosis not present

## 2021-06-19 DIAGNOSIS — G4700 Insomnia, unspecified: Secondary | ICD-10-CM | POA: Diagnosis not present

## 2021-06-19 DIAGNOSIS — D72829 Elevated white blood cell count, unspecified: Secondary | ICD-10-CM | POA: Diagnosis not present

## 2021-06-19 DIAGNOSIS — U071 COVID-19: Secondary | ICD-10-CM | POA: Diagnosis not present

## 2021-06-19 DIAGNOSIS — M1990 Unspecified osteoarthritis, unspecified site: Secondary | ICD-10-CM | POA: Diagnosis not present

## 2021-06-19 DIAGNOSIS — N189 Chronic kidney disease, unspecified: Secondary | ICD-10-CM | POA: Diagnosis not present

## 2021-06-19 DIAGNOSIS — E871 Hypo-osmolality and hyponatremia: Secondary | ICD-10-CM | POA: Diagnosis not present

## 2021-06-19 DIAGNOSIS — I4821 Permanent atrial fibrillation: Secondary | ICD-10-CM

## 2021-06-19 DIAGNOSIS — I4891 Unspecified atrial fibrillation: Secondary | ICD-10-CM | POA: Diagnosis not present

## 2021-06-19 DIAGNOSIS — E119 Type 2 diabetes mellitus without complications: Secondary | ICD-10-CM | POA: Diagnosis not present

## 2021-06-20 ENCOUNTER — Ambulatory Visit (INDEPENDENT_AMBULATORY_CARE_PROVIDER_SITE_OTHER): Payer: PPO

## 2021-06-20 DIAGNOSIS — R42 Dizziness and giddiness: Secondary | ICD-10-CM | POA: Diagnosis not present

## 2021-06-20 DIAGNOSIS — I255 Ischemic cardiomyopathy: Secondary | ICD-10-CM

## 2021-06-22 ENCOUNTER — Other Ambulatory Visit: Payer: Self-pay | Admitting: Physician Assistant

## 2021-06-24 DIAGNOSIS — U071 COVID-19: Secondary | ICD-10-CM | POA: Diagnosis not present

## 2021-06-24 DIAGNOSIS — I2510 Atherosclerotic heart disease of native coronary artery without angina pectoris: Secondary | ICD-10-CM | POA: Diagnosis not present

## 2021-06-24 DIAGNOSIS — N189 Chronic kidney disease, unspecified: Secondary | ICD-10-CM | POA: Diagnosis not present

## 2021-06-24 DIAGNOSIS — E871 Hypo-osmolality and hyponatremia: Secondary | ICD-10-CM | POA: Diagnosis not present

## 2021-06-24 DIAGNOSIS — R42 Dizziness and giddiness: Secondary | ICD-10-CM | POA: Diagnosis not present

## 2021-06-24 DIAGNOSIS — I5022 Chronic systolic (congestive) heart failure: Secondary | ICD-10-CM | POA: Diagnosis not present

## 2021-06-24 DIAGNOSIS — M1990 Unspecified osteoarthritis, unspecified site: Secondary | ICD-10-CM | POA: Diagnosis not present

## 2021-06-24 DIAGNOSIS — K5900 Constipation, unspecified: Secondary | ICD-10-CM | POA: Diagnosis not present

## 2021-06-24 DIAGNOSIS — I4891 Unspecified atrial fibrillation: Secondary | ICD-10-CM | POA: Diagnosis not present

## 2021-06-24 DIAGNOSIS — I255 Ischemic cardiomyopathy: Secondary | ICD-10-CM | POA: Diagnosis not present

## 2021-06-24 DIAGNOSIS — D638 Anemia in other chronic diseases classified elsewhere: Secondary | ICD-10-CM | POA: Diagnosis not present

## 2021-06-24 DIAGNOSIS — E119 Type 2 diabetes mellitus without complications: Secondary | ICD-10-CM | POA: Diagnosis not present

## 2021-06-24 LAB — CUP PACEART REMOTE DEVICE CHECK
Battery Remaining Longevity: 6 mo
Battery Voltage: 2.82 V
Brady Statistic RA Percent Paced: 0 %
Brady Statistic RV Percent Paced: 12.34 %
Date Time Interrogation Session: 20221219122415
HighPow Impedance: 50 Ohm
Implantable Lead Implant Date: 20070502
Implantable Lead Implant Date: 20150205
Implantable Lead Location: 753859
Implantable Lead Location: 753860
Implantable Lead Special Function: 2015
Implantable Pulse Generator Implant Date: 20150205
Lead Channel Impedance Value: 285 Ohm
Lead Channel Impedance Value: 342 Ohm
Lead Channel Impedance Value: 475 Ohm
Lead Channel Pacing Threshold Amplitude: 0.625 V
Lead Channel Pacing Threshold Amplitude: 0.75 V
Lead Channel Pacing Threshold Pulse Width: 0.4 ms
Lead Channel Pacing Threshold Pulse Width: 0.4 ms
Lead Channel Sensing Intrinsic Amplitude: 1.125 mV
Lead Channel Sensing Intrinsic Amplitude: 1.125 mV
Lead Channel Sensing Intrinsic Amplitude: 6.75 mV
Lead Channel Sensing Intrinsic Amplitude: 6.75 mV
Lead Channel Setting Pacing Amplitude: 2 V
Lead Channel Setting Pacing Pulse Width: 0.4 ms
Lead Channel Setting Sensing Sensitivity: 0.3 mV

## 2021-06-26 DIAGNOSIS — I10 Essential (primary) hypertension: Secondary | ICD-10-CM | POA: Diagnosis not present

## 2021-06-27 DIAGNOSIS — I10 Essential (primary) hypertension: Secondary | ICD-10-CM | POA: Diagnosis not present

## 2021-06-28 DIAGNOSIS — E119 Type 2 diabetes mellitus without complications: Secondary | ICD-10-CM | POA: Diagnosis not present

## 2021-07-02 NOTE — Addendum Note (Signed)
Addended by: Cheri Kearns A on: 07/02/2021 04:12 PM   Modules accepted: Level of Service

## 2021-07-02 NOTE — Progress Notes (Signed)
Remote ICD transmission.   

## 2021-07-03 DIAGNOSIS — I10 Essential (primary) hypertension: Secondary | ICD-10-CM | POA: Diagnosis not present

## 2021-07-03 DIAGNOSIS — R7989 Other specified abnormal findings of blood chemistry: Secondary | ICD-10-CM | POA: Diagnosis not present

## 2021-07-09 DIAGNOSIS — I10 Essential (primary) hypertension: Secondary | ICD-10-CM | POA: Diagnosis not present

## 2021-07-09 DIAGNOSIS — R7989 Other specified abnormal findings of blood chemistry: Secondary | ICD-10-CM | POA: Diagnosis not present

## 2021-07-18 NOTE — Progress Notes (Signed)
Office Visit    Patient Name: Amy Wolf Date of Encounter: 07/18/2021  PCP:  Allwardt, [name] M, [facility]  Cardiologist:  Jenkins Rouge, MD  Advanced Practice Provider:  No care team member to display Electrophysiologist:  Will Meredith Leeds, MD      Chief Complaint    Amy Wolf is a 88 y.o. female with a hx of hypertension, hyperlipidemia, diabetes, CAD s/p CABG with mitral and tricuspid annuloplasty rings at that time, ischemic cardiomyopathy, Medtronic ICD, PAF, CKD 3, weakness, carotid artery disease, dyspnea, COVID-19 05/2021  presents today for hospital follow up   Past Medical History    Past Medical History:  Diagnosis Date   AICD (automatic cardioverter/defibrillator) present    Arrhythmia    Arthritis    Atherosclerosis of right carotid artery 04/01/2019   80% blockage. Last ultrasound in 12/2018. Saw vascular surgery in June 2020. Put on DPT in June (asa added by surgeon)   Benign essential HTN 04/01/2019   CAD (coronary artery disease) 04/01/2019   Cardiac arrhythmia due to congenital heart disease    CHF (congestive heart failure) ([place]) 04/01/2019   Chronic kidney disease (CKD), stage III (moderate) ([place]) 04/04/2019   Diabetes ([place])    Glaucoma    Heart murmur    Hx of CABG 04/01/2019   Several years ago per family    Hyperlipemia    Migraines    Pacemaker    Paroxysmal A-fib ([place]) 04/01/2019   warafin in the remote past, but changed to plavix. CHADS2 score of 8%. High fall risk...    PVD (peripheral vascular disease) ([place]) 04/01/2019   Type 2 diabetes mellitus with diabetic chronic kidney disease ([place]) 04/01/2019   Urine incontinence    Valvular heart disease 04/01/2019   Mitral and tricuspid annuloplasty    Past Surgical History:  Procedure Laterality Date   ABDOMINAL HYSTERECTOMY     BACK SURGERY  1980   CARDIAC DEFIBRILLATOR PLACEMENT     CORONARY ARTERY BYPASS GRAFT     HIP FRACTURE SURGERY  2015   PPM GENERATOR  CHANGEOUT      Allergies  Allergies  Allergen Reactions   Iodine Other (See Comments)   Erythromycin Rash   Penicillins Rash   Sulfa Antibiotics Rash    History of Present Illness    Amy Wolf is a 88 y.o. female with a hx of hypertension, hyperlipidemia, diabetes, CAD s/p CABG with mitral and tricuspid annuloplasty rings at that time, ischemic cardiomyopathy, Medtronic ICD, PAF, CKD 3, weakness, carotid artery disease, dyspnea, COVID-19 05/2021 requiring admission last seen while hospitalized.   Echocardiogram October 2020 LVEF 30%, mild LVH, septal lateral dyssynchrony with diffuse hypokinesis, mild TR, moderate MR.    Given frequent falls and anemia at her anticoagulation in the setting of paroxysmal atrial fibrillation has been discontinued previously but able to be resumed.  She was seen 12/2020 doing overall well from a cardiac perspective.  No changes were made at that time.  Admitted 05/26/2021 - 06/06/2021 after testing positive for COVID.  She was additionally treated for possible acute congestive heart failure exacerbation.  Echo 05/28/2021 LVEF 30 to 35%.  ACE inhibitor was stopped due to hypotension.  She was continued on metoprolol and Lasix.  Based on device report 06/16/21 showing multiple episodes of tachycardia her metoprolol tartrate was stopped and she was started on Toprol-XL 50 mg daily.  Presents today for follow-up. Very deconditioned in appearance. Holding her head in her hands  in wheelchair. Notes persistent dizziness over last 2 months. Overall fatigued. No dyspnea, chest pain, edema, palpitations.   EKGs/Labs/Other Studies Reviewed:   The following studies were reviewed today:  Echo 05/27/21  1. Left ventricular ejection fraction, by estimation, is 30 to 35%. Left  ventricular ejection fraction by 3D volume is 30 %. The left ventricle has  moderately decreased function. The left ventricle demonstrates global  hypokinesis. There is moderate  asymmetric  left ventricular hypertrophy of the basal-septal segment. Left  ventricular diastolic function could not be evaluated.   2. Right ventricular systolic function is mildly reduced. The right  ventricular size is normal. There is moderately elevated pulmonary artery  systolic pressure. The estimated right ventricular systolic pressure is  50.5 mmHg.   3. Left atrial size was moderately dilated.   4. The mitral valve has been repaired/replaced. Trivial mitral valve  regurgitation. No evidence of mitral stenosis. The mean mitral valve  gradient is 3.0 mmHg. There is a prosthetic annuloplasty ring present in  the mitral position. Procedure Date: 2007.   5. The tricuspid valve is has been repaired/replaced. The tricuspid valve  is status post repair with an annuloplasty ring. Tricuspid valve  regurgitation is mild to moderate.   6. The aortic valve is tricuspid. Aortic valve regurgitation is not  visualized.   7. The inferior vena cava is normal in size with <50% respiratory  variability, suggesting right atrial pressure of 8 mmHg.   Comparison(s): No significant change from prior study. 04/12/2019: LVEF  30%, global HK, RVSP 53 mmHg.   Carotid duplex 04/09/21  Summary:  Right Carotid: Velocities in the right ICA are consistent with a 40-59%                 stenosis. The ECA appears >50% stenosed.   Left Carotid: Velocities in the left ICA are consistent with a 1-39%  stenosis.               The ECA appears >50% stenosed.   Vertebrals:  Bilateral vertebral arteries demonstrate antegrade flow.  Subclavians: Normal flow hemodynamics were seen in bilateral subclavian               arteries.   *See table(s) above for measurements and observations.  Suggest follow up study in 12 months.   TTE 04/12/19 Review of the above records today demonstrates:  1. Left ventricular ejection fraction, by visual estimation, is 30%. The left ventricle has normal function. Normal left ventricular size.  There is mildly increased left ventricular hypertrophy. Septal-lateral dyssynchrony with diffuse hypokinesis.  2. Left ventricular diastolic Doppler parameters are indeterminate pattern of LV diastolic filling.  3. S/p tricuspid valve repair. No more than moderate tricuspid regurgitation visually though thick CW doppler jet raises concern for more significant tricuspid regurgitation.  4. S/p mitral valve repair. No more than moderate mitral regurgitation visually, though thick CW doppler jet raises concern for more significant mitral regurgitation. Mean gradient 3 mmHg across the mitral valve, no evidence for significant stenosis.  5. The aortic valve is tricuspid Aortic valve regurgitation is trivial by color flow Doppler. Mild aortic valve sclerosis without stenosis.  6. Global right ventricle has mildly reduced systolic function.The right ventricular size is mildly enlarged. No increase in right ventricular wall thickness.  7. A pacer wire is visualized in the RV.  8. The tricuspid valve is normal in structure. Tricuspid valve regurgitation is mild.  9. Right atrial size was moderately dilated. 10. Left atrial size was  severely dilated. 11. The inferior vena cava is dilated in size with >50% respiratory variability, suggesting right atrial pressure of 8 mmHg. 12. The tricuspid regurgitant velocity is 3.35 m/s, and with an assumed right atrial pressure of 8 mmHg, the estimated right ventricular systolic pressure is moderately elevated at 52.9 mmHg.   EKG:  No EKG today.   Recent Labs: 05/26/2021: B Natriuretic Peptide 1,436.9 05/31/2021: Magnesium 2.3 06/03/2021: ALT 37; Hemoglobin 11.4; Platelets 231 06/04/2021: BUN 46; Creatinine, Ser 1.07; Potassium 4.2; Sodium 131  Recent Lipid Panel    Component Value Date/Time   CHOL 170 04/01/2019 1133   TRIG 87.0 04/01/2019 1133   HDL 63.80 04/01/2019 1133   CHOLHDL 3 04/01/2019 1133   VLDL 17.4 04/01/2019 1133   LDLCALC 89 04/01/2019 1133     Risk Assessment/Calculations:   CHA2DS2-VASc Score = 7   This indicates a 11.2% annual risk of stroke. The patient's score is based upon: CHF History: 1 HTN History: 1 Diabetes History: 1 Stroke History: 0 Vascular Disease History: 1 Age Score: 2 Gender Score: 1   Home Medications   No outpatient medications have been marked as taking for the 07/19/21 encounter (Office Visit) with Loel Dubonnet, NP.     Review of Systems      All other systems reviewed and are otherwise negative except as noted above.  Physical Exam    VS:  There were no vitals taken for this visit. , BMI There is no height or weight on file to calculate BMI.  Wt Readings from Last 3 Encounters:  06/06/21 91 lb 11.4 oz (41.6 kg)  05/07/21 98 lb 9.6 oz (44.7 kg)  01/01/21 97 lb 3.2 oz (44.1 kg)     GEN: Frail appearing, in no acute distress. HEENT: normal. Neck: Supple, no JVD, carotid bruits, or masses. Cardiac: irregularly irregular,  no murmurs, rubs, or gallops. No clubbing, cyanosis, edema.  Radials/PT 2+ and equal bilaterally.  Respiratory:  Respirations regular and unlabored, clear to auscultation bilaterally. GI: Soft, nontender, nondistended. MS: No deformity or atrophy. Skin: Warm and dry, no rash. Neuro:  Strength and sensation are intact. Psych: Normal affect.  Assessment & Plan    Chronic systolic heart failure with history of ischemic cardiomyopathy - Euvolemic and well compensated on exam. Not requiring diuretic therapy. Echo 05/2021 with stable LVEF 30%. Heart healthy diet and regular cardiovascular exercise encouraged.    S/p Medtronic ICD - Continue to follow with EP. Given report of tachycardia by recent device check will stop Metoprolol Tartrate and start Metoprolol Succinate 25mg  QD. Check in via MyChart in 1 week.  Dizziness - Persistent over 2 mos despite Meclizine. No near syncope, syncope. Endorses nausea. Recommend thyroid panel at SNF. Has upcoming CBC due to recent  positive hemoccult scheduled Monday at SNF. Recommend trial of Epley maneuver at SNF. She is eating and drinking regularly. No documented hypotension by SNF vitals. Concern tachycardia contributory, management detailed above. May need to consider ENT referral if does not improve. Recent carotid duplex 04/2021 and echo 05/2021 already completed and detailed above.   CAD s/p CABG with mitral and tricuspid annuloplasty rings at that time - GDMT includes Plavix, Metoprolol, Pravastatin. Heart healthy diet and regular cardiovascular exercise encouraged.  Stable with no anginal symptoms. No indication for ischemic evaluation.  Recent echo 05/2021 stable and detailed above.  Permanent atrial fibrillation/chronic anticoagulation -reduced dose Eliquis due to age, body habitus.  DM2 - Continue to follow with PCP.   CKD 3A -  Careful titration of diuretic and antihypertensive.    Carotid artery disease - Moderate by duplex 04/2021. Repeat duplex planned for 2023. Continue cholesterol control.   Disposition: Follow up  in 4-6 months  with Jenkins Rouge, MD or APP.  Signed, Loel Dubonnet, NP 07/18/2021, 4:43 PM [facility]

## 2021-07-19 ENCOUNTER — Encounter (HOSPITAL_BASED_OUTPATIENT_CLINIC_OR_DEPARTMENT_OTHER): Payer: Self-pay | Admitting: Family

## 2021-07-19 ENCOUNTER — Ambulatory Visit (INDEPENDENT_AMBULATORY_CARE_PROVIDER_SITE_OTHER): Payer: PPO | Admitting: Family

## 2021-07-19 ENCOUNTER — Other Ambulatory Visit: Payer: Self-pay

## 2021-07-19 VITALS — BP 124/62 | HR 76 | Ht 64.0 in

## 2021-07-19 DIAGNOSIS — I255 Ischemic cardiomyopathy: Secondary | ICD-10-CM | POA: Diagnosis not present

## 2021-07-19 DIAGNOSIS — R42 Dizziness and giddiness: Secondary | ICD-10-CM | POA: Diagnosis not present

## 2021-07-19 DIAGNOSIS — I5022 Chronic systolic (congestive) heart failure: Secondary | ICD-10-CM | POA: Diagnosis not present

## 2021-07-19 DIAGNOSIS — D6859 Other primary thrombophilia: Secondary | ICD-10-CM | POA: Diagnosis not present

## 2021-07-19 DIAGNOSIS — I4821 Permanent atrial fibrillation: Secondary | ICD-10-CM

## 2021-07-19 DIAGNOSIS — Z95810 Presence of automatic (implantable) cardiac defibrillator: Secondary | ICD-10-CM

## 2021-07-19 DIAGNOSIS — I6523 Occlusion and stenosis of bilateral carotid arteries: Secondary | ICD-10-CM | POA: Diagnosis not present

## 2021-07-19 MED ORDER — METOPROLOL SUCCINATE ER 25 MG PO TB24: 25.0000 mg | ORAL_TABLET | Freq: Every day | ORAL | 5 refills | Status: DC

## 2021-07-19 NOTE — Patient Instructions (Addendum)
Medication Instructions:  Your physician has recommended you make the following change in your medication:   STOP Metoprolol Tartrate  START Metoprolol Succinate one 25mg  tablet daily  *If you need a refill on your cardiac medications before your next appointment, please call your pharmacy*   Lab Work: Your physician recommends thyroid panel at time of labs Monday at [facility].   Testing/Procedures: Recent device check showed episodes of tachycardia. As such, we have adjusted your Metoprolol per Dr. Curt Bears recommendations.    Follow-Up: At [facility], you and your health needs are our priority.  As part of our continuing mission to provide you with exceptional heart care, we have created designated Provider Care Teams.  These Care Teams include your primary Cardiologist (physician) and Advanced Practice Providers (APPs -  Physician Assistants and Nurse Practitioners) who all work together to provide you with the care you need, when you need it.  We recommend signing up for the patient portal called "MyChart".  Sign up information is provided on this After Visit Summary.  MyChart is used to connect with patients for Virtual Visits (Telemedicine).  Patients are able to view lab/test results, encounter notes, upcoming appointments, etc.  Non-urgent messages can be sent to your provider as well.   To learn more about what you can do with MyChart, go to [email or web address].    Your next appointment:   May 1st at 9:30am with Dr. Johnsie Cancel at the [facility]  The format for your next appointment:   In Person  Provider:   Jenkins Rouge, MD    Other Instructions   How to Perform the Epley Maneuver The Epley maneuver is an exercise that relieves symptoms of vertigo. Vertigo is the feeling that you or your surroundings are moving when they are not. When you feel vertigo, you may feel like the room is spinning and may have trouble walking. The Epley maneuver is  used for a type of vertigo caused by a calcium deposit in a part of the inner ear. The maneuver involves changing head positions to help the deposit move out of the area. You can do this maneuver at home whenever you have symptoms of vertigo. You can repeat it in 24 hours if your vertigo has not gone away. Even though the Epley maneuver may relieve your vertigo for a few weeks, it is possible that your symptoms will return. This maneuver relieves vertigo, but it does not relieve dizziness. What are the risks? If it is done correctly, the Epley maneuver is considered safe. Sometimes it can lead to dizziness or nausea that goes away after a short time. If you develop other symptoms--such as changes in vision, weakness, or numbness--stop doing the maneuver and call your health care provider. Supplies needed: A bed or table. A pillow. How to do the Epley maneuver   Sit on the edge of a bed or table with your back straight and your legs extended or hanging over the edge of the bed or table. Turn your head halfway toward the affected ear or side as told by your health care provider. Lie backward quickly with your head turned until you are lying flat on your back. Your head should dangle (head-hanging position). You may want to position a pillow under your shoulders. Hold this position for at least 30 seconds. If you feel dizzy or have symptoms of vertigo, continue to hold the position until the symptoms stop. Turn your head to the opposite direction until your unaffected ear  is facing down. Your head should continue to dangle. Hold this position for at least 30 seconds. If you feel dizzy or have symptoms of vertigo, continue to hold the position until the symptoms stop. Turn your whole body to the same side as your head so that you are positioned on your side. Your head will now be nearly facedown and no longer needs to dangle. Hold for at least 30 seconds. If you feel dizzy or have symptoms of vertigo,  continue to hold the position until the symptoms stop. Sit back up. You can repeat the maneuver in 24 hours if your vertigo does not go away. Follow these instructions at home: For 24 hours after doing the Epley maneuver: Keep your head in an upright position. When lying down to sleep or rest, keep your head raised (elevated) with two or more pillows. Avoid excessive neck movements. Activity Do not drive or use machinery if you feel dizzy. After doing the Epley maneuver, return to your normal activities as told by your health care provider. Ask your health care provider what activities are safe for you. General instructions Drink enough fluid to keep your urine pale yellow. Do not drink alcohol. Take over-the-counter and prescription medicines only as told by your health care provider. Keep all follow-up visits. This is important. Preventing vertigo symptoms Ask your health care provider if there is anything you should do at home to prevent vertigo. He or she may recommend that you: Keep your head elevated with two or more pillows while you sleep. Do not sleep on the side of your affected ear. Get up slowly from bed. Avoid sudden movements during the day. Avoid extreme head positions or movement, such as looking up or bending over. Contact a health care provider if: Your vertigo gets worse. You have other symptoms, including: Nausea. Vomiting. Headache. Get help right away if you: Have vision changes. Have a headache or neck pain that is severe or getting worse. Cannot stop vomiting. Have new numbness or weakness in any part of your body. These symptoms may represent a serious problem that is an emergency. Do not wait to see if the symptoms will go away. Get medical help right away. Call your local emergency services (911 in the [place] Do not drive yourself to the hospital. Summary Vertigo is the feeling that you or your surroundings are moving when they are not. The Epley  maneuver is an exercise that relieves symptoms of vertigo. If the Epley maneuver is done correctly, it is considered safe. This information is not intended to replace advice given to you by your health care provider. Make sure you discuss any questions you have with your health care provider. Document Revised: 05/23/2020 Document Reviewed: 05/23/2020 Elsevier Patient Education  2022 [employer].

## 2021-07-22 ENCOUNTER — Ambulatory Visit: Payer: PPO | Admitting: Physician Assistant

## 2021-07-22 ENCOUNTER — Ambulatory Visit (INDEPENDENT_AMBULATORY_CARE_PROVIDER_SITE_OTHER): Payer: PPO

## 2021-07-22 DIAGNOSIS — I5022 Chronic systolic (congestive) heart failure: Secondary | ICD-10-CM | POA: Diagnosis not present

## 2021-07-22 DIAGNOSIS — R946 Abnormal results of thyroid function studies: Secondary | ICD-10-CM | POA: Diagnosis not present

## 2021-07-22 DIAGNOSIS — D649 Anemia, unspecified: Secondary | ICD-10-CM | POA: Diagnosis not present

## 2021-07-23 LAB — CUP PACEART REMOTE DEVICE CHECK
Battery Remaining Longevity: 6 mo
Battery Voltage: 2.81 V
Brady Statistic RA Percent Paced: 0 %
Brady Statistic RV Percent Paced: 1.21 %
Date Time Interrogation Session: 20230116125223
HighPow Impedance: 53 Ohm
Implantable Lead Implant Date: 20070502
Implantable Lead Implant Date: 20150205
Implantable Lead Location: 753859
Implantable Lead Location: 753860
Implantable Lead Special Function: 2015
Implantable Pulse Generator Implant Date: 20150205
Lead Channel Impedance Value: 285 Ohm
Lead Channel Impedance Value: 342 Ohm
Lead Channel Impedance Value: 475 Ohm
Lead Channel Pacing Threshold Amplitude: 0.5 V
Lead Channel Pacing Threshold Amplitude: 0.75 V
Lead Channel Pacing Threshold Pulse Width: 0.4 ms
Lead Channel Pacing Threshold Pulse Width: 0.4 ms
Lead Channel Sensing Intrinsic Amplitude: 1.75 mV
Lead Channel Sensing Intrinsic Amplitude: 1.75 mV
Lead Channel Sensing Intrinsic Amplitude: 7.625 mV
Lead Channel Sensing Intrinsic Amplitude: 7.625 mV
Lead Channel Setting Pacing Amplitude: 2 V
Lead Channel Setting Pacing Pulse Width: 0.4 ms
Lead Channel Setting Sensing Sensitivity: 0.3 mV

## 2021-07-23 NOTE — Telephone Encounter (Signed)
**Note De-Identified  Obfuscation** Letter received from [facility] stating that they denied the pt for assistance with Eliquis. Reason for denial: 3% out of pocket RX expense, based on household adjusted gross income, not met. Pt ID: [id number]  The letters states that they have notified the pt of this denial as well.

## 2021-07-24 DIAGNOSIS — I10 Essential (primary) hypertension: Secondary | ICD-10-CM | POA: Diagnosis not present

## 2021-07-24 DIAGNOSIS — I4891 Unspecified atrial fibrillation: Secondary | ICD-10-CM | POA: Diagnosis not present

## 2021-07-24 DIAGNOSIS — K5900 Constipation, unspecified: Secondary | ICD-10-CM | POA: Diagnosis not present

## 2021-07-24 DIAGNOSIS — E871 Hypo-osmolality and hyponatremia: Secondary | ICD-10-CM | POA: Diagnosis not present

## 2021-07-24 DIAGNOSIS — G4700 Insomnia, unspecified: Secondary | ICD-10-CM | POA: Diagnosis not present

## 2021-07-24 DIAGNOSIS — D638 Anemia in other chronic diseases classified elsewhere: Secondary | ICD-10-CM | POA: Diagnosis not present

## 2021-07-24 DIAGNOSIS — I2510 Atherosclerotic heart disease of native coronary artery without angina pectoris: Secondary | ICD-10-CM | POA: Diagnosis not present

## 2021-07-24 DIAGNOSIS — M1990 Unspecified osteoarthritis, unspecified site: Secondary | ICD-10-CM | POA: Diagnosis not present

## 2021-07-24 DIAGNOSIS — E119 Type 2 diabetes mellitus without complications: Secondary | ICD-10-CM | POA: Diagnosis not present

## 2021-07-24 DIAGNOSIS — I5022 Chronic systolic (congestive) heart failure: Secondary | ICD-10-CM | POA: Diagnosis not present

## 2021-07-24 DIAGNOSIS — N189 Chronic kidney disease, unspecified: Secondary | ICD-10-CM | POA: Diagnosis not present

## 2021-07-24 DIAGNOSIS — R630 Anorexia: Secondary | ICD-10-CM | POA: Diagnosis not present

## 2021-07-24 DIAGNOSIS — I255 Ischemic cardiomyopathy: Secondary | ICD-10-CM | POA: Diagnosis not present

## 2021-07-26 ENCOUNTER — Encounter (HOSPITAL_BASED_OUTPATIENT_CLINIC_OR_DEPARTMENT_OTHER): Payer: Self-pay

## 2021-07-26 DIAGNOSIS — I10 Essential (primary) hypertension: Secondary | ICD-10-CM | POA: Diagnosis not present

## 2021-07-29 DIAGNOSIS — K5900 Constipation, unspecified: Secondary | ICD-10-CM | POA: Diagnosis not present

## 2021-07-29 DIAGNOSIS — I255 Ischemic cardiomyopathy: Secondary | ICD-10-CM | POA: Diagnosis not present

## 2021-07-29 DIAGNOSIS — D638 Anemia in other chronic diseases classified elsewhere: Secondary | ICD-10-CM | POA: Diagnosis not present

## 2021-07-29 DIAGNOSIS — I2510 Atherosclerotic heart disease of native coronary artery without angina pectoris: Secondary | ICD-10-CM | POA: Diagnosis not present

## 2021-07-29 DIAGNOSIS — R630 Anorexia: Secondary | ICD-10-CM | POA: Diagnosis not present

## 2021-07-29 DIAGNOSIS — D500 Iron deficiency anemia secondary to blood loss (chronic): Secondary | ICD-10-CM | POA: Diagnosis not present

## 2021-07-29 DIAGNOSIS — N189 Chronic kidney disease, unspecified: Secondary | ICD-10-CM | POA: Diagnosis not present

## 2021-07-29 DIAGNOSIS — E871 Hypo-osmolality and hyponatremia: Secondary | ICD-10-CM | POA: Diagnosis not present

## 2021-07-29 DIAGNOSIS — I5022 Chronic systolic (congestive) heart failure: Secondary | ICD-10-CM | POA: Diagnosis not present

## 2021-07-29 DIAGNOSIS — E119 Type 2 diabetes mellitus without complications: Secondary | ICD-10-CM | POA: Diagnosis not present

## 2021-07-29 DIAGNOSIS — I4891 Unspecified atrial fibrillation: Secondary | ICD-10-CM | POA: Diagnosis not present

## 2021-07-31 DIAGNOSIS — I10 Essential (primary) hypertension: Secondary | ICD-10-CM | POA: Diagnosis not present

## 2021-08-01 ENCOUNTER — Other Ambulatory Visit: Payer: Self-pay

## 2021-08-01 ENCOUNTER — Emergency Department (HOSPITAL_COMMUNITY): Payer: PPO

## 2021-08-01 ENCOUNTER — Encounter: Payer: Self-pay | Admitting: Cardiology

## 2021-08-01 ENCOUNTER — Encounter (HOSPITAL_COMMUNITY): Payer: Self-pay | Admitting: Emergency Medicine

## 2021-08-01 ENCOUNTER — Emergency Department (HOSPITAL_COMMUNITY)
Admission: EM | Admit: 2021-08-01 | Discharge: 2021-08-02 | Disposition: A | Discharge status: Home / Self Care | Payer: PPO | Attending: Student | Admitting: Student

## 2021-08-01 DIAGNOSIS — K921 Melena: Secondary | ICD-10-CM | POA: Diagnosis not present

## 2021-08-01 DIAGNOSIS — E1122 Type 2 diabetes mellitus with diabetic chronic kidney disease: Secondary | ICD-10-CM | POA: Insufficient documentation

## 2021-08-01 DIAGNOSIS — N183 Chronic kidney disease, stage 3 unspecified: Secondary | ICD-10-CM | POA: Diagnosis not present

## 2021-08-01 DIAGNOSIS — Z7984 Long term (current) use of oral hypoglycemic drugs: Secondary | ICD-10-CM | POA: Insufficient documentation

## 2021-08-01 DIAGNOSIS — Z7901 Long term (current) use of anticoagulants: Secondary | ICD-10-CM | POA: Insufficient documentation

## 2021-08-01 DIAGNOSIS — N189 Chronic kidney disease, unspecified: Secondary | ICD-10-CM | POA: Diagnosis not present

## 2021-08-01 DIAGNOSIS — K5900 Constipation, unspecified: Secondary | ICD-10-CM | POA: Diagnosis not present

## 2021-08-01 DIAGNOSIS — U071 COVID-19: Secondary | ICD-10-CM | POA: Diagnosis not present

## 2021-08-01 DIAGNOSIS — I480 Paroxysmal atrial fibrillation: Secondary | ICD-10-CM | POA: Insufficient documentation

## 2021-08-01 DIAGNOSIS — K922 Gastrointestinal hemorrhage, unspecified: Secondary | ICD-10-CM | POA: Insufficient documentation

## 2021-08-01 DIAGNOSIS — Z95810 Presence of automatic (implantable) cardiac defibrillator: Secondary | ICD-10-CM | POA: Insufficient documentation

## 2021-08-01 DIAGNOSIS — R630 Anorexia: Secondary | ICD-10-CM | POA: Diagnosis not present

## 2021-08-01 DIAGNOSIS — Z955 Presence of coronary angioplasty implant and graft: Secondary | ICD-10-CM | POA: Insufficient documentation

## 2021-08-01 DIAGNOSIS — I2510 Atherosclerotic heart disease of native coronary artery without angina pectoris: Secondary | ICD-10-CM | POA: Insufficient documentation

## 2021-08-01 DIAGNOSIS — R6889 Other general symptoms and signs: Secondary | ICD-10-CM | POA: Diagnosis not present

## 2021-08-01 DIAGNOSIS — E119 Type 2 diabetes mellitus without complications: Secondary | ICD-10-CM | POA: Diagnosis not present

## 2021-08-01 DIAGNOSIS — E871 Hypo-osmolality and hyponatremia: Secondary | ICD-10-CM | POA: Diagnosis not present

## 2021-08-01 DIAGNOSIS — I130 Hypertensive heart and chronic kidney disease with heart failure and stage 1 through stage 4 chronic kidney disease, or unspecified chronic kidney disease: Secondary | ICD-10-CM | POA: Insufficient documentation

## 2021-08-01 DIAGNOSIS — D500 Iron deficiency anemia secondary to blood loss (chronic): Secondary | ICD-10-CM | POA: Diagnosis not present

## 2021-08-01 DIAGNOSIS — I5022 Chronic systolic (congestive) heart failure: Secondary | ICD-10-CM | POA: Insufficient documentation

## 2021-08-01 DIAGNOSIS — R319 Hematuria, unspecified: Secondary | ICD-10-CM | POA: Diagnosis not present

## 2021-08-01 DIAGNOSIS — D638 Anemia in other chronic diseases classified elsewhere: Secondary | ICD-10-CM | POA: Diagnosis not present

## 2021-08-01 DIAGNOSIS — I4891 Unspecified atrial fibrillation: Secondary | ICD-10-CM | POA: Diagnosis not present

## 2021-08-01 DIAGNOSIS — C679 Malignant neoplasm of bladder, unspecified: Secondary | ICD-10-CM | POA: Insufficient documentation

## 2021-08-01 DIAGNOSIS — Z79899 Other long term (current) drug therapy: Secondary | ICD-10-CM | POA: Insufficient documentation

## 2021-08-01 DIAGNOSIS — I255 Ischemic cardiomyopathy: Secondary | ICD-10-CM | POA: Diagnosis not present

## 2021-08-01 LAB — CBC WITH DIFFERENTIAL/PLATELET
Abs Immature Granulocytes: 0.05 10*3/uL (ref 0.00–0.07)
Basophils Absolute: 0.1 10*3/uL (ref 0.0–0.1)
Basophils Relative: 1 %
Eosinophils Absolute: 0.2 10*3/uL (ref 0.0–0.5)
Eosinophils Relative: 2 %
HCT: 31.1 % — ABNORMAL LOW (ref 36.0–46.0)
Hemoglobin: 10 g/dL — ABNORMAL LOW (ref 12.0–15.0)
Immature Granulocytes: 1 %
Lymphocytes Relative: 35 %
Lymphs Abs: 3.5 10*3/uL (ref 0.7–4.0)
MCH: 30.1 pg (ref 26.0–34.0)
MCHC: 32.2 g/dL (ref 30.0–36.0)
MCV: 93.7 fL (ref 80.0–100.0)
Monocytes Absolute: 1 10*3/uL (ref 0.1–1.0)
Monocytes Relative: 10 %
Neutro Abs: 5.1 10*3/uL (ref 1.7–7.7)
Neutrophils Relative %: 51 %
Platelets: 360 10*3/uL (ref 150–400)
RBC: 3.32 MIL/uL — ABNORMAL LOW (ref 3.87–5.11)
RDW: 14.4 % (ref 11.5–15.5)
WBC: 10 10*3/uL (ref 4.0–10.5)
nRBC: 0 % (ref 0.0–0.2)

## 2021-08-01 LAB — URINALYSIS, ROUTINE W REFLEX MICROSCOPIC: RBC / HPF: >50 RBC/hpf — ABNORMAL HIGH (ref 0–5)

## 2021-08-01 LAB — RESP PANEL BY RT-PCR (FLU A&B, COVID) ARPGX2
Influenza A by PCR: NEGATIVE
Influenza B by PCR: NEGATIVE
SARS Coronavirus 2 by RT PCR: POSITIVE — AB

## 2021-08-01 LAB — COMPREHENSIVE METABOLIC PANEL
ALT: 12 U/L (ref 0–44)
AST: 18 U/L (ref 15–41)
Albumin: 3.2 g/dL — ABNORMAL LOW (ref 3.5–5.0)
Alkaline Phosphatase: 59 U/L (ref 38–126)
Anion gap: 5 (ref 5–15)
BUN: 21 mg/dL (ref 8–23)
CO2: 25 mmol/L (ref 22–32)
Calcium: 8.8 mg/dL — ABNORMAL LOW (ref 8.9–10.3)
Chloride: 100 mmol/L (ref 98–111)
Creatinine, Ser: 0.74 mg/dL (ref 0.44–1.00)
GFR, Estimated: >60 mL/min (ref >60)
Glucose, Bld: 119 mg/dL — ABNORMAL HIGH (ref 70–99)
Potassium: 4.3 mmol/L (ref 3.5–5.1)
Sodium: 130 mmol/L — ABNORMAL LOW (ref 135–145)
Total Bilirubin: 0.5 mg/dL (ref 0.3–1.2)
Total Protein: 7.3 g/dL (ref 6.5–8.1)

## 2021-08-01 LAB — PROTIME-INR
INR: 1.2 (ref 0.8–1.2)
Prothrombin Time: 14.8 seconds (ref 11.4–15.2)

## 2021-08-01 LAB — POC OCCULT BLOOD, ED: Fecal Occult Bld: NEGATIVE

## 2021-08-01 NOTE — ED Notes (Signed)
Female purewick placed on pt °

## 2021-08-01 NOTE — ED Provider Notes (Signed)
[facility] DEPT Provider Note  CSN: [id number] Arrival date & time: 08/01/21 1554  Chief Complaint(s) Hematuria and Rectal Bleeding  HPI Amy Wolf is a 88 y.o. female with PMH CHF, CAD status post CABG, paroxysmal A. fib on Eliquis, AICD placement who presents the emergency department for evaluation of hematuria versus rectal bleeding.  Minimal history given by EMS but it appears the patient has been having either rectal bleeding or hematuria for several days and was sent to the emergency department for evaluation.  The patient denies chest pain, shortness of breath, abdominal pain, nausea, vomiting, dysuria, increased frequency or other systemic symptoms.  Additional history obtained from daughter who states that she has had medical evaluations performed at our facility and has had trouble getting into see a GI doctor for suspected rectal bleeding.  She states that facility staff are concerned about blood in the patient's diaper.   Hematuria  Rectal Bleeding  Past Medical History Past Medical History:  Diagnosis Date   AICD (automatic cardioverter/defibrillator) present    Arrhythmia    Arthritis    Atherosclerosis of right carotid artery 04/01/2019   80% blockage. Last ultrasound in 12/2018. Saw vascular surgery in June 2020. Put on DPT in June (asa added by surgeon)   Benign essential HTN 04/01/2019   CAD (coronary artery disease) 04/01/2019   Cardiac arrhythmia due to congenital heart disease    CHF (congestive heart failure) ([place]) 04/01/2019   Chronic kidney disease (CKD), stage III (moderate) ([employer]) 04/04/2019   Diabetes ([place])    Glaucoma    Heart murmur    Hx of CABG 04/01/2019   Several years ago per family    Hyperlipemia    Migraines    Pacemaker    Paroxysmal A-fib ([place]) 04/01/2019   warafin in the remote past, but changed to plavix. CHADS2 score of 8%. High fall risk...    PVD (peripheral vascular disease) ([place]) 04/01/2019   Type 2 diabetes  mellitus with diabetic chronic kidney disease ([place]) 04/01/2019   Urine incontinence    Valvular heart disease 04/01/2019   Mitral and tricuspid annuloplasty    Patient Active Problem List   Diagnosis Date Noted   Protein-calorie malnutrition, severe 05/29/2021   Acute respiratory failure with hypoxia ([place]) 05/26/2021   Hypokalemia 05/26/2021   Acute respiratory disease due to COVID-19 virus 93/71/6967   Chronic systolic CHF (congestive heart failure) ([place]) 05/26/2021   Persistent atrial fibrillation ([place]) 01/23/2020   Secondary hypercoagulable state ([place]) 01/23/2020   Chronic kidney disease (CKD), stage III (moderate) ([place]) 04/04/2019   Benign essential HTN 04/01/2019   Type 2 diabetes mellitus with diabetic chronic kidney disease ([place]) 04/01/2019   CAD (coronary artery disease) 04/01/2019   Hx of CABG 04/01/2019   Paroxysmal A-fib ([place]) 04/01/2019   Atherosclerosis of right carotid artery 04/01/2019   PVD (peripheral vascular disease) ([place]) 04/01/2019   AICD (automatic cardioverter/defibrillator) present 04/01/2019   Valvular heart disease 04/01/2019   CHF (congestive heart failure) ([place]) 04/01/2019   Glaucoma 04/01/2019   Home Medication(s) Prior to Admission medications   Medication Sig Start Date End Date Taking? Authorizing Provider  acetaminophen (TYLENOL) 500 MG tablet Take 500 mg by mouth in the morning and at bedtime.     [provider]  bimatoprost (LUMIGAN) 0.01 % SOLN Place 1 drop into both eyes at bedtime.    [provider]  brimonidine (ALPHAGAN) 0.15 % ophthalmic solution Place 1 drop into the right eye 2 (two) times daily.  [provider]  ELIQUIS 2.5 MG TABS tablet TAKE 1 TABLET BY MOUTH TWICE A DAY 10/17/20   Camnitz, Ocie Doyne, MD  ferrous sulfate 325 (65 FE) MG tablet Take 325 mg by mouth daily with breakfast.     [provider]  furosemide (LASIX) 20 MG tablet Take 1 tablet (20 mg total) by mouth daily. 06/07/21    Shelly Coss, MD  glipiZIDE (GLUCOTROL XL) 2.5 MG 24 hr tablet TAKE 1 TABLET BY MOUTH EVERY DAY WITH BREAKFAST Patient taking differently: 2.5 mg daily with breakfast. 04/18/21   Allwardt, [name] M, PA-C  guaiFENesin-dextromethorphan (ROBITUSSIN DM) 100-10 MG/5ML syrup Take 10 mLs by mouth every 6 (six) hours as needed for cough. 06/06/21   Shelly Coss, MD  meclizine (ANTIVERT) 25 MG tablet Take by mouth. 06/20/21   [provider]  Melatonin 3 MG CAPS Take 3 mg by mouth at bedtime.     [provider]  metoprolol succinate (TOPROL XL) 25 MG 24 hr tablet Take 1 tablet (25 mg total) by mouth daily. 07/19/21   Loel Dubonnet, NP  Netarsudil Dimesylate (RHOPRESSA) 0.02 % SOLN Place 1 drop into both eyes at bedtime.    [provider]  ondansetron (ZOFRAN-ODT) 4 MG disintegrating tablet Take by mouth. 06/12/21   [provider]  pantoprazole (PROTONIX) 40 MG tablet Take 40 mg by mouth daily. 07/18/21   [provider]  polyethylene glycol (MIRALAX / GLYCOLAX) 17 g packet Take 17 g by mouth daily. 06/07/21   Shelly Coss, MD  pravastatin (PRAVACHOL) 80 MG tablet TAKE 1 TABLET BY MOUTH EVERY DAY 06/24/21   Allwardt, [name] M, PA-C  senna-docusate (SENOKOT-S) 8.6-50 MG tablet Take 1 tablet by mouth 2 (two) times daily. 06/06/21   Shelly Coss, MD  sodium chloride 1 g tablet Take 1 g by mouth 2 (two) times daily. 07/08/21   [provider]                                                                                                                                    Past Surgical History Past Surgical History:  Procedure Laterality Date   ABDOMINAL HYSTERECTOMY     [place]     CORONARY ARTERY BYPASS GRAFT     HIP FRACTURE SURGERY  2015   PPM GENERATOR CHANGEOUT     Family History Family History  Problem Relation Age of Onset   Cancer Father    Cancer Sister    Cancer Daughter      Social History Social History   Tobacco Use   Smoking status: Never   Smokeless tobacco: Never  Vaping Use   Vaping Use: Never used  Substance Use Topics   Alcohol use: Not Currently   Drug use: Never   Allergies Iodine, Erythromycin, Penicillins, and Sulfa antibiotics  Review of Systems Review of Systems  Gastrointestinal:  Positive for hematochezia.  Genitourinary:  Positive for hematuria.   Physical Exam Vital Signs  I have reviewed the triage vital signs BP (!) 155/98 (BP Location: Left Arm)    Pulse 71    Temp 97.7 F (36.5 C) (Oral)    Resp 16    Ht 5\' 4"  (1.626 m)    Wt 43.1 kg    SpO2 100%    BMI 16.31 kg/m   Physical Exam Vitals and nursing note reviewed.  Constitutional:      General: She is not in acute distress.    Appearance: She is well-developed.  HENT:     Head: Normocephalic and atraumatic.  Eyes:     Conjunctiva/sclera: Conjunctivae normal.  Cardiovascular:     Rate and Rhythm: Normal rate and regular rhythm.     Heart sounds: No murmur heard. Pulmonary:     Effort: Pulmonary effort is normal. No respiratory distress.     Breath sounds: Normal breath sounds.  Abdominal:     Palpations: Abdomen is soft.     Tenderness: There is no abdominal tenderness.  Genitourinary:    Rectum: Guaiac result negative.  Musculoskeletal:        General: No swelling.     Cervical back: Neck supple.  Skin:    General: Skin is warm and dry.     Capillary Refill: Capillary refill takes less than 2 seconds.  Neurological:     Mental Status: She is alert.  Psychiatric:        Mood and Affect: Mood normal.    ED Results and Treatments Labs (all labs ordered are listed, but only abnormal results are displayed) Labs Reviewed - No data to display                                                                                                                        Radiology No results found.  Pertinent labs & imaging results that were available during my  care of the patient were reviewed by me and considered in my medical decision making (see MDM for details).  Medications Ordered in ED Medications - No data to display                                                                                                                                   Procedures Procedures  (including critical care time)  Medical Decision Making / ED  Course   This patient presents to the ED for concern of hematuria, this involves an extensive number of treatment options, and is a complaint that carries with it a high risk of complications and morbidity.  The differential diagnosis includes nephrolithiasis, bladder carcinoma, urethral trauma worsened by anticoagulation, GI bleed  MDM: Patient seen emergency department for evaluation of hematuria versus GI bleeding.  Physical exam is unremarkable with no abdominal tenderness to palpation, rectal unremarkable with negative guaiac.  Laboratory evaluation with mild hyponatremia to 130, hemoglobin 10.01 from 11.4 7-month ago.  Urinalysis largely inconclusive due to gross hematuria.  CT abdomen pelvis with new bladder carcinoma.  Bladder cancer certainly fits the patient's presentation in the setting of painless hematuria, and I consulted urology who recommended close outpatient follow-up.  Patient is able to void without difficulty here in the emergency department does not require catheterization at this time.  Patient will be discharged and patient's daughter was given resources for close outpatient urology follow-up.  She was instructed to have a conversation with her primary doctor about stopping her Eliquis.  Patient then discharged back to her facility with outpatient urology follow-up.   Additional history obtained: -Additional history obtained from daughter -External records from outside source obtained and reviewed including: Chart review including previous notes, labs, imaging, consultation notes   Lab  Tests: -I ordered, reviewed, and interpreted labs.   The pertinent results include:   Labs Reviewed - No data to display   Imaging Studies ordered: I ordered imaging studies including CTAP I independently visualized and interpreted imaging. I agree with the radiologist interpretation   Medicines ordered and prescription drug management: No orders of the defined types were placed in this encounter.   -I have reviewed the patients home medicines and have made adjustments as needed  Critical interventions none  Consultations Obtained: I requested consultation with the urologist,  and discussed lab and imaging findings as well as pertinent plan - they recommend: close outpatient follow up    Cardiac Monitoring: The patient was maintained on a cardiac monitor.  I personally viewed and interpreted the cardiac monitored which showed an underlying rhythm of: Paced ventricular rhythm   Social Determinants of Health:  Factors impacting patients care include: none   Reevaluation: After the interventions noted above, I reevaluated the patient and found that they have :stayed the same  Co morbidities that complicate the patient evaluation  Past Medical History:  Diagnosis Date   AICD (automatic cardioverter/defibrillator) present    Arrhythmia    Arthritis    Atherosclerosis of right carotid artery 04/01/2019   80% blockage. Last ultrasound in 12/2018. Saw vascular surgery in June 2020. Put on DPT in June (asa added by surgeon)   Benign essential HTN 04/01/2019   CAD (coronary artery disease) 04/01/2019   Cardiac arrhythmia due to congenital heart disease    CHF (congestive heart failure) ([place]) 04/01/2019   Chronic kidney disease (CKD), stage III (moderate) ([place]) 04/04/2019   Diabetes ([place])    Glaucoma    Heart murmur    Hx of CABG 04/01/2019   Several years ago per family    Hyperlipemia    Migraines    Pacemaker    Paroxysmal A-fib ([place]) 04/01/2019   warafin in the remote past,  but changed to plavix. CHADS2 score of 8%. High fall risk...    PVD (peripheral vascular disease) ([place]) 04/01/2019   Type 2 diabetes mellitus with diabetic chronic kidney disease ([place]) 04/01/2019   Urine incontinence  Valvular heart disease 04/01/2019   Mitral and tricuspid annuloplasty       Dispostion: I considered admission for this patient, she does not meet inpatient criteria at this time and is safe for close outpatient neurologic follow-up.     Final Clinical Impression(s) / ED Diagnoses Final diagnoses:  None     @PCDICTATION @    Teressa Lower, MD 08/01/21 2232

## 2021-08-01 NOTE — ED Triage Notes (Signed)
BIB [facility] from [facility]  Staff stated stool culture had blood and urine had  visible blood today. Pt is oriented to self and place which is her baseline   Vitals 148/82 O2 99% room air Pulse 82 RR 16  Pt denies any pain or discomfort.

## 2021-08-01 NOTE — ED Notes (Signed)
Pt resting in bed with daughter at bedside. Denies any pain. No needs at this time. Call bell in reach

## 2021-08-01 NOTE — ED Notes (Signed)
Called ptar. Patient is 7th on the list.

## 2021-08-02 ENCOUNTER — Encounter (HOSPITAL_BASED_OUTPATIENT_CLINIC_OR_DEPARTMENT_OTHER): Payer: Self-pay | Admitting: Family

## 2021-08-02 DIAGNOSIS — M2550 Pain in unspecified joint: Secondary | ICD-10-CM | POA: Diagnosis not present

## 2021-08-02 DIAGNOSIS — Z7401 Bed confinement status: Secondary | ICD-10-CM | POA: Diagnosis not present

## 2021-08-02 NOTE — Telephone Encounter (Signed)
See MyChart message 07/26/21. Given hematuria, new bladder cancer - recommend she hold Eliquis until seen by urology.   Patient's daughter may still want input from Dr. Curt Bears.   Loel Dubonnet, NP

## 2021-08-02 NOTE — Progress Notes (Signed)
Remote ICD transmission.   

## 2021-08-03 DIAGNOSIS — N390 Urinary tract infection, site not specified: Secondary | ICD-10-CM | POA: Diagnosis not present

## 2021-08-05 DIAGNOSIS — R630 Anorexia: Secondary | ICD-10-CM | POA: Diagnosis not present

## 2021-08-05 DIAGNOSIS — I2510 Atherosclerotic heart disease of native coronary artery without angina pectoris: Secondary | ICD-10-CM | POA: Diagnosis not present

## 2021-08-05 DIAGNOSIS — D500 Iron deficiency anemia secondary to blood loss (chronic): Secondary | ICD-10-CM | POA: Diagnosis not present

## 2021-08-05 DIAGNOSIS — E871 Hypo-osmolality and hyponatremia: Secondary | ICD-10-CM | POA: Diagnosis not present

## 2021-08-05 DIAGNOSIS — N189 Chronic kidney disease, unspecified: Secondary | ICD-10-CM | POA: Diagnosis not present

## 2021-08-05 DIAGNOSIS — E119 Type 2 diabetes mellitus without complications: Secondary | ICD-10-CM | POA: Diagnosis not present

## 2021-08-05 DIAGNOSIS — I4891 Unspecified atrial fibrillation: Secondary | ICD-10-CM | POA: Diagnosis not present

## 2021-08-05 DIAGNOSIS — I5022 Chronic systolic (congestive) heart failure: Secondary | ICD-10-CM | POA: Diagnosis not present

## 2021-08-05 DIAGNOSIS — I255 Ischemic cardiomyopathy: Secondary | ICD-10-CM | POA: Diagnosis not present

## 2021-08-05 DIAGNOSIS — I10 Essential (primary) hypertension: Secondary | ICD-10-CM | POA: Diagnosis not present

## 2021-08-05 DIAGNOSIS — N390 Urinary tract infection, site not specified: Secondary | ICD-10-CM | POA: Diagnosis not present

## 2021-08-05 DIAGNOSIS — C679 Malignant neoplasm of bladder, unspecified: Secondary | ICD-10-CM | POA: Diagnosis not present

## 2021-08-05 DIAGNOSIS — K5900 Constipation, unspecified: Secondary | ICD-10-CM | POA: Diagnosis not present

## 2021-08-09 DIAGNOSIS — D649 Anemia, unspecified: Secondary | ICD-10-CM | POA: Diagnosis not present

## 2021-08-10 DIAGNOSIS — R899 Unspecified abnormal finding in specimens from other organs, systems and tissues: Secondary | ICD-10-CM | POA: Diagnosis not present

## 2021-08-13 DIAGNOSIS — S91101A Unspecified open wound of right great toe without damage to nail, initial encounter: Secondary | ICD-10-CM | POA: Diagnosis not present

## 2021-08-14 DIAGNOSIS — D631 Anemia in chronic kidney disease: Secondary | ICD-10-CM | POA: Diagnosis not present

## 2021-08-19 DIAGNOSIS — D631 Anemia in chronic kidney disease: Secondary | ICD-10-CM | POA: Diagnosis not present

## 2021-08-20 DIAGNOSIS — S91101A Unspecified open wound of right great toe without damage to nail, initial encounter: Secondary | ICD-10-CM | POA: Diagnosis not present

## 2021-08-22 ENCOUNTER — Ambulatory Visit (INDEPENDENT_AMBULATORY_CARE_PROVIDER_SITE_OTHER): Payer: PPO

## 2021-08-22 ENCOUNTER — Ambulatory Visit: Payer: Medicaid Other | Admitting: Internal Medicine

## 2021-08-22 DIAGNOSIS — I5022 Chronic systolic (congestive) heart failure: Secondary | ICD-10-CM

## 2021-08-22 DIAGNOSIS — I5032 Chronic diastolic (congestive) heart failure: Secondary | ICD-10-CM | POA: Diagnosis not present

## 2021-08-22 DIAGNOSIS — R1312 Dysphagia, oropharyngeal phase: Secondary | ICD-10-CM | POA: Diagnosis not present

## 2021-08-22 LAB — CUP PACEART REMOTE DEVICE CHECK
Battery Remaining Longevity: 4 mo
Battery Voltage: 2.79 V
Brady Statistic RA Percent Paced: 0 %
Brady Statistic RV Percent Paced: 6.48 %
Date Time Interrogation Session: 20230216114154
HighPow Impedance: 50 Ohm
Implantable Lead Implant Date: 20070502
Implantable Lead Implant Date: 20150205
Implantable Lead Location: 753859
Implantable Lead Location: 753860
Implantable Lead Special Function: 2015
Implantable Pulse Generator Implant Date: 20150205
Lead Channel Impedance Value: 247 Ohm
Lead Channel Impedance Value: 342 Ohm
Lead Channel Impedance Value: 475 Ohm
Lead Channel Pacing Threshold Amplitude: 0.625 V
Lead Channel Pacing Threshold Amplitude: 0.75 V
Lead Channel Pacing Threshold Pulse Width: 0.4 ms
Lead Channel Pacing Threshold Pulse Width: 0.4 ms
Lead Channel Sensing Intrinsic Amplitude: 1.25 mV
Lead Channel Sensing Intrinsic Amplitude: 1.25 mV
Lead Channel Sensing Intrinsic Amplitude: 7 mV
Lead Channel Sensing Intrinsic Amplitude: 7 mV
Lead Channel Setting Pacing Amplitude: 2 V
Lead Channel Setting Pacing Pulse Width: 0.4 ms
Lead Channel Setting Sensing Sensitivity: 0.3 mV

## 2021-08-27 ENCOUNTER — Telehealth: Payer: Self-pay | Admitting: Pharmacist

## 2021-08-27 DIAGNOSIS — S91101A Unspecified open wound of right great toe without damage to nail, initial encounter: Secondary | ICD-10-CM | POA: Diagnosis not present

## 2021-08-27 NOTE — Progress Notes (Signed)
Chronic Care Management Pharmacy Assistant   Name: Amy Wolf  MRN: [id number] DOB: Jan 09, 1928   Reason for Encounter: General Adherence Call    Recent office visits:  None  Recent consult visits:  08/01/2021 Patient [name], NP; Given hematuria, new bladder cancer - recommend she hold Eliquis until seen by urology.    Patient's daughter may still want input from Dr. Curt Bears  07/19/2021 OV (Cardiology) Amy Dubonnet, NP; Given report of tachycardia by recent device check will stop Metoprolol Tartrate and start Metoprolol Succinate 25mg  QD -reduced dose Eliquis due to age, body habitus.  05/07/2021 OV (Cardiology) Amy Haw, MD; no medication changes indicated.  Hospital visits:  08/01/2021 ED visit for Malignant neoplasm of urinary bladder -She was instructed to have a conversation with her primary doctor about stopping her Eliquis.    05/26/2021 ED to Hospital Admission due to COVID-19 Admit date: 05/26/2021 Discharge date: 06/06/2021 STOP ramipril  Medications: Outpatient Encounter Medications as of 08/27/2021  Medication Sig   acetaminophen (TYLENOL) 500 MG tablet Take 500 mg by mouth in the morning and at bedtime.    bimatoprost (LUMIGAN) 0.01 % SOLN Place 1 drop into both eyes at bedtime.   brimonidine (ALPHAGAN) 0.15 % ophthalmic solution Place 1 drop into the right eye 2 (two) times daily.   ELIQUIS 2.5 MG TABS tablet TAKE 1 TABLET BY MOUTH TWICE A DAY   ferrous sulfate 325 (65 FE) MG tablet Take 325 mg by mouth daily with breakfast.    furosemide (LASIX) 20 MG tablet Take 1 tablet (20 mg total) by mouth daily.   glipiZIDE (GLUCOTROL XL) 2.5 MG 24 hr tablet TAKE 1 TABLET BY MOUTH EVERY DAY WITH BREAKFAST (Patient taking differently: 2.5 mg daily with breakfast.)   guaiFENesin-dextromethorphan (ROBITUSSIN DM) 100-10 MG/5ML syrup Take 10 mLs by mouth every 6 (six) hours as needed for cough.   meclizine (ANTIVERT) 25 MG tablet Take by  mouth.   Melatonin 3 MG CAPS Take 3 mg by mouth at bedtime.    metoprolol succinate (TOPROL XL) 25 MG 24 hr tablet Take 1 tablet (25 mg total) by mouth daily.   Netarsudil Dimesylate (RHOPRESSA) 0.02 % SOLN Place 1 drop into both eyes at bedtime.   ondansetron (ZOFRAN-ODT) 4 MG disintegrating tablet Take by mouth.   pantoprazole (PROTONIX) 40 MG tablet Take 40 mg by mouth daily.   polyethylene glycol (MIRALAX / GLYCOLAX) 17 g packet Take 17 g by mouth daily.   pravastatin (PRAVACHOL) 80 MG tablet TAKE 1 TABLET BY MOUTH EVERY DAY   senna-docusate (SENOKOT-S) 8.6-50 MG tablet Take 1 tablet by mouth 2 (two) times daily.   sodium chloride 1 g tablet Take 1 g by mouth 2 (two) times daily.   No facility-administered encounter medications on file as of 08/27/2021.   Patient Questions: Have you had any problems recently with your health? Patients daughter states the patient is currently in a skilled [facility] facility [facility] She states the patient will stay there permanently.  Have you had any problems with your pharmacy? Patients daughter states the skill nursing facility manages the patients medications.  What issues or side effects are you having with your medications? No side effects that she is aware of.  What would you like me to pass along to Leata Mouse, CPP for him to help you with?  Patients daughter states they would like to cancel their upcoming appointment in April 2023.  What can we do to take  care of you better? No current recommendations or suggestions.  Care Gaps: Medicare Annual Wellness: Completed 11/01/2020 Ophthalmology Exam: Overdue - never done Foot Exam: Overdue since 05/01/2020 Hemoglobin A1C: 6.6% on 01/21/2021 Colonoscopy: Aged out Dexa Scan: Overdue - never done Mammogram: Aged out  Future Appointments  Date Time Provider [place]  09/23/2021  7:00 AM [facility] DEVICE REMOTES [facility]  10/08/2021  2:15 PM [facility] CCM  PHARMACIST [facility]  10/24/2021  7:00 AM [facility] DEVICE REMOTES [facility]  11/04/2021  9:30 AM Josue Hector, MD [facility]  11/07/2021  2:30 PM [facility] HEALTH COACH [facility]  11/25/2021  7:00 AM [facility] DEVICE REMOTES [facility]  12/26/2021  7:00 AM [facility] DEVICE REMOTES [facility]    Star Rating Drugs: Pravastatin 80 mg last filled 07/04/2021 30 DS Ramipril 2.5 mg last filled 05/29/2021 90 DS Glipizide 2.5 mg last filled 07/06/2021 30 DS  April D Calhoun, [place] Pharmacist Assistant [phone]

## 2021-08-27 NOTE — Progress Notes (Signed)
Remote ICD transmission.   

## 2021-08-28 DIAGNOSIS — D631 Anemia in chronic kidney disease: Secondary | ICD-10-CM | POA: Diagnosis not present

## 2021-08-29 DIAGNOSIS — D638 Anemia in other chronic diseases classified elsewhere: Secondary | ICD-10-CM | POA: Diagnosis not present

## 2021-08-29 DIAGNOSIS — C679 Malignant neoplasm of bladder, unspecified: Secondary | ICD-10-CM | POA: Diagnosis not present

## 2021-08-29 DIAGNOSIS — I10 Essential (primary) hypertension: Secondary | ICD-10-CM | POA: Diagnosis not present

## 2021-08-29 DIAGNOSIS — D500 Iron deficiency anemia secondary to blood loss (chronic): Secondary | ICD-10-CM | POA: Diagnosis not present

## 2021-08-29 DIAGNOSIS — I2510 Atherosclerotic heart disease of native coronary artery without angina pectoris: Secondary | ICD-10-CM | POA: Diagnosis not present

## 2021-08-29 DIAGNOSIS — N130 Hydronephrosis with ureteropelvic junction obstruction: Secondary | ICD-10-CM | POA: Diagnosis not present

## 2021-08-29 DIAGNOSIS — D494 Neoplasm of unspecified behavior of bladder: Secondary | ICD-10-CM | POA: Diagnosis not present

## 2021-08-30 ENCOUNTER — Ambulatory Visit: Payer: PPO | Admitting: Podiatry

## 2021-09-02 DIAGNOSIS — E871 Hypo-osmolality and hyponatremia: Secondary | ICD-10-CM | POA: Diagnosis not present

## 2021-09-02 DIAGNOSIS — I4891 Unspecified atrial fibrillation: Secondary | ICD-10-CM | POA: Diagnosis not present

## 2021-09-02 DIAGNOSIS — C679 Malignant neoplasm of bladder, unspecified: Secondary | ICD-10-CM | POA: Diagnosis not present

## 2021-09-02 DIAGNOSIS — I5022 Chronic systolic (congestive) heart failure: Secondary | ICD-10-CM | POA: Diagnosis not present

## 2021-09-02 DIAGNOSIS — K5900 Constipation, unspecified: Secondary | ICD-10-CM | POA: Diagnosis not present

## 2021-09-02 DIAGNOSIS — I255 Ischemic cardiomyopathy: Secondary | ICD-10-CM | POA: Diagnosis not present

## 2021-09-02 DIAGNOSIS — N189 Chronic kidney disease, unspecified: Secondary | ICD-10-CM | POA: Diagnosis not present

## 2021-09-02 DIAGNOSIS — D638 Anemia in other chronic diseases classified elsewhere: Secondary | ICD-10-CM | POA: Diagnosis not present

## 2021-09-02 DIAGNOSIS — E119 Type 2 diabetes mellitus without complications: Secondary | ICD-10-CM | POA: Diagnosis not present

## 2021-09-02 DIAGNOSIS — I10 Essential (primary) hypertension: Secondary | ICD-10-CM | POA: Diagnosis not present

## 2021-09-02 DIAGNOSIS — R630 Anorexia: Secondary | ICD-10-CM | POA: Diagnosis not present

## 2021-09-02 DIAGNOSIS — D500 Iron deficiency anemia secondary to blood loss (chronic): Secondary | ICD-10-CM | POA: Diagnosis not present

## 2021-09-02 DIAGNOSIS — D631 Anemia in chronic kidney disease: Secondary | ICD-10-CM | POA: Diagnosis not present

## 2021-09-03 DIAGNOSIS — S91101A Unspecified open wound of right great toe without damage to nail, initial encounter: Secondary | ICD-10-CM | POA: Diagnosis not present

## 2021-09-04 DIAGNOSIS — M6281 Muscle weakness (generalized): Secondary | ICD-10-CM | POA: Diagnosis not present

## 2021-09-04 DIAGNOSIS — R2681 Unsteadiness on feet: Secondary | ICD-10-CM | POA: Diagnosis not present

## 2021-09-04 DIAGNOSIS — R1312 Dysphagia, oropharyngeal phase: Secondary | ICD-10-CM | POA: Diagnosis not present

## 2021-09-04 DIAGNOSIS — I5032 Chronic diastolic (congestive) heart failure: Secondary | ICD-10-CM | POA: Diagnosis not present

## 2021-09-04 DIAGNOSIS — M1990 Unspecified osteoarthritis, unspecified site: Secondary | ICD-10-CM | POA: Diagnosis not present

## 2021-09-10 DIAGNOSIS — S91101A Unspecified open wound of right great toe without damage to nail, initial encounter: Secondary | ICD-10-CM | POA: Diagnosis not present

## 2021-09-11 DIAGNOSIS — D631 Anemia in chronic kidney disease: Secondary | ICD-10-CM | POA: Diagnosis not present

## 2021-09-12 DIAGNOSIS — I7091 Generalized atherosclerosis: Secondary | ICD-10-CM | POA: Diagnosis not present

## 2021-09-12 DIAGNOSIS — M2042 Other hammer toe(s) (acquired), left foot: Secondary | ICD-10-CM | POA: Diagnosis not present

## 2021-09-12 DIAGNOSIS — B351 Tinea unguium: Secondary | ICD-10-CM | POA: Diagnosis not present

## 2021-09-12 DIAGNOSIS — M2041 Other hammer toe(s) (acquired), right foot: Secondary | ICD-10-CM | POA: Diagnosis not present

## 2021-09-12 DIAGNOSIS — E1159 Type 2 diabetes mellitus with other circulatory complications: Secondary | ICD-10-CM | POA: Diagnosis not present

## 2021-09-17 DIAGNOSIS — S91101A Unspecified open wound of right great toe without damage to nail, initial encounter: Secondary | ICD-10-CM | POA: Diagnosis not present

## 2021-09-20 ENCOUNTER — Other Ambulatory Visit: Payer: Self-pay

## 2021-09-20 ENCOUNTER — Inpatient Hospital Stay (HOSPITAL_COMMUNITY)
Admission: EM | Admit: 2021-09-20 | Discharge: 2021-09-22 | DRG: 061 | Disposition: A | Discharge status: Hospice | Payer: PPO | Attending: Neurology | Admitting: Neurology

## 2021-09-20 ENCOUNTER — Emergency Department (HOSPITAL_COMMUNITY): Payer: PPO

## 2021-09-20 ENCOUNTER — Inpatient Hospital Stay (HOSPITAL_COMMUNITY): Payer: PPO

## 2021-09-20 DIAGNOSIS — L89890 Pressure ulcer of other site, unstageable: Secondary | ICD-10-CM | POA: Diagnosis present

## 2021-09-20 DIAGNOSIS — Z7901 Long term (current) use of anticoagulants: Secondary | ICD-10-CM

## 2021-09-20 DIAGNOSIS — R54 Age-related physical debility: Secondary | ICD-10-CM | POA: Diagnosis not present

## 2021-09-20 DIAGNOSIS — Z8616 Personal history of COVID-19: Secondary | ICD-10-CM

## 2021-09-20 DIAGNOSIS — Z90710 Acquired absence of both cervix and uterus: Secondary | ICD-10-CM

## 2021-09-20 DIAGNOSIS — N1831 Chronic kidney disease, stage 3a: Secondary | ICD-10-CM | POA: Diagnosis present

## 2021-09-20 DIAGNOSIS — I63512 Cerebral infarction due to unspecified occlusion or stenosis of left middle cerebral artery: Secondary | ICD-10-CM | POA: Diagnosis not present

## 2021-09-20 DIAGNOSIS — I639 Cerebral infarction, unspecified: Principal | ICD-10-CM

## 2021-09-20 DIAGNOSIS — I5022 Chronic systolic (congestive) heart failure: Secondary | ICD-10-CM | POA: Diagnosis not present

## 2021-09-20 DIAGNOSIS — J9600 Acute respiratory failure, unspecified whether with hypoxia or hypercapnia: Secondary | ICD-10-CM | POA: Diagnosis not present

## 2021-09-20 DIAGNOSIS — I6502 Occlusion and stenosis of left vertebral artery: Secondary | ICD-10-CM | POA: Diagnosis not present

## 2021-09-20 DIAGNOSIS — I130 Hypertensive heart and chronic kidney disease with heart failure and stage 1 through stage 4 chronic kidney disease, or unspecified chronic kidney disease: Secondary | ICD-10-CM | POA: Diagnosis not present

## 2021-09-20 DIAGNOSIS — J9601 Acute respiratory failure with hypoxia: Secondary | ICD-10-CM

## 2021-09-20 DIAGNOSIS — R9431 Abnormal electrocardiogram [ECG] [EKG]: Secondary | ICD-10-CM | POA: Diagnosis not present

## 2021-09-20 DIAGNOSIS — S0011XA Contusion of right eyelid and periocular area, initial encounter: Secondary | ICD-10-CM | POA: Diagnosis present

## 2021-09-20 DIAGNOSIS — C679 Malignant neoplasm of bladder, unspecified: Secondary | ICD-10-CM | POA: Diagnosis not present

## 2021-09-20 DIAGNOSIS — I6523 Occlusion and stenosis of bilateral carotid arteries: Secondary | ICD-10-CM | POA: Diagnosis not present

## 2021-09-20 DIAGNOSIS — Z515 Encounter for palliative care: Secondary | ICD-10-CM | POA: Diagnosis not present

## 2021-09-20 DIAGNOSIS — E785 Hyperlipidemia, unspecified: Secondary | ICD-10-CM | POA: Diagnosis not present

## 2021-09-20 DIAGNOSIS — Z882 Allergy status to sulfonamides status: Secondary | ICD-10-CM

## 2021-09-20 DIAGNOSIS — E46 Unspecified protein-calorie malnutrition: Secondary | ICD-10-CM | POA: Diagnosis not present

## 2021-09-20 DIAGNOSIS — R404 Transient alteration of awareness: Secondary | ICD-10-CM | POA: Diagnosis not present

## 2021-09-20 DIAGNOSIS — J690 Pneumonitis due to inhalation of food and vomit: Secondary | ICD-10-CM | POA: Diagnosis not present

## 2021-09-20 DIAGNOSIS — Z95810 Presence of automatic (implantable) cardiac defibrillator: Secondary | ICD-10-CM

## 2021-09-20 DIAGNOSIS — R4189 Other symptoms and signs involving cognitive functions and awareness: Secondary | ICD-10-CM | POA: Diagnosis not present

## 2021-09-20 DIAGNOSIS — Z789 Other specified health status: Secondary | ICD-10-CM | POA: Diagnosis not present

## 2021-09-20 DIAGNOSIS — I2510 Atherosclerotic heart disease of native coronary artery without angina pectoris: Secondary | ICD-10-CM | POA: Diagnosis present

## 2021-09-20 DIAGNOSIS — Z7984 Long term (current) use of oral hypoglycemic drugs: Secondary | ICD-10-CM

## 2021-09-20 DIAGNOSIS — I63412 Cerebral infarction due to embolism of left middle cerebral artery: Principal | ICD-10-CM | POA: Diagnosis present

## 2021-09-20 DIAGNOSIS — R1310 Dysphagia, unspecified: Secondary | ICD-10-CM | POA: Diagnosis not present

## 2021-09-20 DIAGNOSIS — G9349 Other encephalopathy: Secondary | ICD-10-CM | POA: Diagnosis not present

## 2021-09-20 DIAGNOSIS — Z20822 Contact with and (suspected) exposure to covid-19: Secondary | ICD-10-CM | POA: Diagnosis present

## 2021-09-20 DIAGNOSIS — I4819 Other persistent atrial fibrillation: Secondary | ICD-10-CM | POA: Diagnosis not present

## 2021-09-20 DIAGNOSIS — E1122 Type 2 diabetes mellitus with diabetic chronic kidney disease: Secondary | ICD-10-CM | POA: Diagnosis not present

## 2021-09-20 DIAGNOSIS — R310 Gross hematuria: Secondary | ICD-10-CM | POA: Diagnosis not present

## 2021-09-20 DIAGNOSIS — Z66 Do not resuscitate: Secondary | ICD-10-CM | POA: Diagnosis present

## 2021-09-20 DIAGNOSIS — Z79899 Other long term (current) drug therapy: Secondary | ICD-10-CM

## 2021-09-20 DIAGNOSIS — Z881 Allergy status to other antibiotic agents status: Secondary | ICD-10-CM

## 2021-09-20 DIAGNOSIS — R29738 NIHSS score 38: Secondary | ICD-10-CM | POA: Diagnosis present

## 2021-09-20 DIAGNOSIS — R4701 Aphasia: Secondary | ICD-10-CM | POA: Diagnosis present

## 2021-09-20 DIAGNOSIS — H409 Unspecified glaucoma: Secondary | ICD-10-CM | POA: Diagnosis present

## 2021-09-20 DIAGNOSIS — R918 Other nonspecific abnormal finding of lung field: Secondary | ICD-10-CM | POA: Diagnosis not present

## 2021-09-20 DIAGNOSIS — Z951 Presence of aortocoronary bypass graft: Secondary | ICD-10-CM

## 2021-09-20 DIAGNOSIS — R0681 Apnea, not elsewhere classified: Secondary | ICD-10-CM | POA: Diagnosis not present

## 2021-09-20 DIAGNOSIS — R0902 Hypoxemia: Secondary | ICD-10-CM | POA: Diagnosis not present

## 2021-09-20 DIAGNOSIS — M1990 Unspecified osteoarthritis, unspecified site: Secondary | ICD-10-CM | POA: Diagnosis present

## 2021-09-20 DIAGNOSIS — R569 Unspecified convulsions: Secondary | ICD-10-CM | POA: Diagnosis not present

## 2021-09-20 DIAGNOSIS — G8191 Hemiplegia, unspecified affecting right dominant side: Secondary | ICD-10-CM | POA: Diagnosis present

## 2021-09-20 DIAGNOSIS — Z888 Allergy status to other drugs, medicaments and biological substances status: Secondary | ICD-10-CM

## 2021-09-20 DIAGNOSIS — R4020 Unspecified coma: Secondary | ICD-10-CM | POA: Diagnosis not present

## 2021-09-20 DIAGNOSIS — R29810 Facial weakness: Secondary | ICD-10-CM | POA: Diagnosis not present

## 2021-09-20 DIAGNOSIS — Z950 Presence of cardiac pacemaker: Secondary | ICD-10-CM | POA: Diagnosis not present

## 2021-09-20 DIAGNOSIS — R6889 Other general symptoms and signs: Secondary | ICD-10-CM | POA: Diagnosis not present

## 2021-09-20 DIAGNOSIS — R0603 Acute respiratory distress: Secondary | ICD-10-CM | POA: Diagnosis not present

## 2021-09-20 DIAGNOSIS — I4891 Unspecified atrial fibrillation: Secondary | ICD-10-CM | POA: Diagnosis not present

## 2021-09-20 DIAGNOSIS — I700 Atherosclerosis of aorta: Secondary | ICD-10-CM | POA: Diagnosis not present

## 2021-09-20 DIAGNOSIS — L97519 Non-pressure chronic ulcer of other part of right foot with unspecified severity: Secondary | ICD-10-CM | POA: Diagnosis not present

## 2021-09-20 DIAGNOSIS — I6622 Occlusion and stenosis of left posterior cerebral artery: Secondary | ICD-10-CM | POA: Diagnosis not present

## 2021-09-20 DIAGNOSIS — Z7189 Other specified counseling: Secondary | ICD-10-CM | POA: Diagnosis not present

## 2021-09-20 DIAGNOSIS — Z79890 Hormone replacement therapy (postmenopausal): Secondary | ICD-10-CM

## 2021-09-20 LAB — COMPREHENSIVE METABOLIC PANEL
ALT: 25 U/L (ref 0–44)
AST: 38 U/L (ref 15–41)
Albumin: 3.5 g/dL (ref 3.5–5.0)
Alkaline Phosphatase: 54 U/L (ref 38–126)
Anion gap: 10 (ref 5–15)
BUN: 20 mg/dL (ref 8–23)
CO2: 24 mmol/L (ref 22–32)
Calcium: 9.4 mg/dL (ref 8.9–10.3)
Chloride: 98 mmol/L (ref 98–111)
Creatinine, Ser: 0.9 mg/dL (ref 0.44–1.00)
GFR, Estimated: >60 mL/min (ref >60)
Glucose, Bld: 188 mg/dL — ABNORMAL HIGH (ref 70–99)
Potassium: 4.6 mmol/L (ref 3.5–5.1)
Sodium: 132 mmol/L — ABNORMAL LOW (ref 135–145)
Total Bilirubin: 0.2 mg/dL — ABNORMAL LOW (ref 0.3–1.2)
Total Protein: 7.3 g/dL (ref 6.5–8.1)

## 2021-09-20 LAB — DIFFERENTIAL
Abs Immature Granulocytes: 0.12 10*3/uL — ABNORMAL HIGH (ref 0.00–0.07)
Basophils Absolute: 0.1 10*3/uL (ref 0.0–0.1)
Basophils Relative: 0 %
Eosinophils Absolute: 0.3 10*3/uL (ref 0.0–0.5)
Eosinophils Relative: 2 %
Immature Granulocytes: 1 %
Lymphocytes Relative: 30 %
Lymphs Abs: 5.2 10*3/uL — ABNORMAL HIGH (ref 0.7–4.0)
Monocytes Absolute: 0.8 10*3/uL (ref 0.1–1.0)
Monocytes Relative: 5 %
Neutro Abs: 10.9 10*3/uL — ABNORMAL HIGH (ref 1.7–7.7)
Neutrophils Relative %: 62 %

## 2021-09-20 LAB — RESP PANEL BY RT-PCR (FLU A&B, COVID) ARPGX2
Influenza A by PCR: NEGATIVE
Influenza B by PCR: NEGATIVE
SARS Coronavirus 2 by RT PCR: NEGATIVE

## 2021-09-20 LAB — I-STAT VENOUS BLOOD GAS, ED
Acid-base deficit: 3 mmol/L — ABNORMAL HIGH (ref 0.0–2.0)
Bicarbonate: 24.3 mmol/L (ref 20.0–28.0)
Calcium, Ion: 1.24 mmol/L (ref 1.15–1.40)
HCT: 39 % (ref 36.0–46.0)
Hemoglobin: 13.3 g/dL (ref 12.0–15.0)
O2 Saturation: 46 %
Potassium: 4.6 mmol/L (ref 3.5–5.1)
Sodium: 133 mmol/L — ABNORMAL LOW (ref 135–145)
TCO2: 26 mmol/L (ref 22–32)
pCO2, Ven: 49.9 mmHg (ref 44–60)
pH, Ven: 7.296 (ref 7.25–7.43)
pO2, Ven: 29 mmHg — CL (ref 32–45)

## 2021-09-20 LAB — CBC
HCT: 37.3 % (ref 36.0–46.0)
Hemoglobin: 11.7 g/dL — ABNORMAL LOW (ref 12.0–15.0)
MCH: 28.7 pg (ref 26.0–34.0)
MCHC: 31.4 g/dL (ref 30.0–36.0)
MCV: 91.4 fL (ref 80.0–100.0)
Platelets: 315 10*3/uL (ref 150–400)
RBC: 4.08 MIL/uL (ref 3.87–5.11)
RDW: 14.1 % (ref 11.5–15.5)
WBC: 17.3 10*3/uL — ABNORMAL HIGH (ref 4.0–10.5)
nRBC: 0 % (ref 0.0–0.2)

## 2021-09-20 LAB — I-STAT CHEM 8, ED
BUN: 22 mg/dL (ref 8–23)
Calcium, Ion: 1.23 mmol/L (ref 1.15–1.40)
Chloride: 99 mmol/L (ref 98–111)
Creatinine, Ser: 0.9 mg/dL (ref 0.44–1.00)
Glucose, Bld: 178 mg/dL — ABNORMAL HIGH (ref 70–99)
HCT: 39 % (ref 36.0–46.0)
Hemoglobin: 13.3 g/dL (ref 12.0–15.0)
Potassium: 4.6 mmol/L (ref 3.5–5.1)
Sodium: 133 mmol/L — ABNORMAL LOW (ref 135–145)
TCO2: 24 mmol/L (ref 22–32)

## 2021-09-20 LAB — PROTIME-INR
INR: 1.1 (ref 0.8–1.2)
Prothrombin Time: 14 seconds (ref 11.4–15.2)

## 2021-09-20 LAB — ETHANOL: Alcohol, Ethyl (B): <10 mg/dL (ref <10)

## 2021-09-20 LAB — APTT: aPTT: 33 seconds (ref 24–36)

## 2021-09-20 MED ORDER — PANTOPRAZOLE SODIUM 40 MG IV SOLR: 40.0000 mg | Freq: Every day | INTRAVENOUS | Status: DC

## 2021-09-20 MED ORDER — ACETAMINOPHEN 325 MG PO TABS: 650.0000 mg | ORAL_TABLET | ORAL | Status: DC | PRN

## 2021-09-20 MED ORDER — STROKE: EARLY STAGES OF RECOVERY BOOK: Freq: Once | Status: DC

## 2021-09-20 MED ORDER — CLEVIDIPINE BUTYRATE 0.5 MG/ML IV EMUL
0.0000 mg/h | INTRAVENOUS | Status: DC
  Administered 2021-09-20: 1 mg/h via INTRAVENOUS
  Administered 2021-09-21: 2 mg/h via INTRAVENOUS
  Filled 2021-09-20: qty 50

## 2021-09-20 MED ORDER — ACETAMINOPHEN 650 MG RE SUPP: 650.0000 mg | RECTAL | Status: DC | PRN

## 2021-09-20 MED ORDER — DIPHENHYDRAMINE HCL 50 MG/ML IJ SOLN
50.0000 mg | Freq: Once | INTRAMUSCULAR | Status: DC
  Filled 2021-09-20: qty 1

## 2021-09-20 MED ORDER — SENNOSIDES-DOCUSATE SODIUM 8.6-50 MG PO TABS: 1.0000 | ORAL_TABLET | Freq: Every evening | ORAL | Status: DC | PRN

## 2021-09-20 MED ORDER — TENECTEPLASE FOR STROKE
0.2500 mg/kg | PACK | Freq: Once | INTRAVENOUS | Status: DC
  Filled 2021-09-20: qty 10

## 2021-09-20 MED ORDER — LABETALOL HCL 5 MG/ML IV SOLN
20.0000 mg | Freq: Once | INTRAVENOUS | Status: AC
  Administered 2021-09-20: 10 mg via INTRAVENOUS

## 2021-09-20 MED ORDER — HYDROCORTISONE SOD SUC (PF) 100 MG IJ SOLR
200.0000 mg | Freq: Once | INTRAMUSCULAR | Status: AC
  Administered 2021-09-20: 200 mg via INTRAVENOUS
  Filled 2021-09-20: qty 4

## 2021-09-20 MED ORDER — SODIUM CHLORIDE 0.9 % IV SOLN: INTRAVENOUS | Status: DC

## 2021-09-20 MED ORDER — IOHEXOL 350 MG/ML SOLN
75.0000 mL | Freq: Once | INTRAVENOUS | Status: AC | PRN
  Administered 2021-09-20: 75 mL via INTRAVENOUS

## 2021-09-20 MED ORDER — SODIUM CHLORIDE 0.9 % IV SOLN
1.0000 g | INTRAVENOUS | Status: DC
  Filled 2021-09-20 (×2): qty 10

## 2021-09-20 MED ORDER — TENECTEPLASE FOR STROKE
0.2500 mg/kg | PACK | Freq: Once | INTRAVENOUS | Status: AC
  Administered 2021-09-20: 11 mg via INTRAVENOUS
  Filled 2021-09-20: qty 10

## 2021-09-20 MED ORDER — ACETAMINOPHEN 160 MG/5ML PO SOLN: 650.0000 mg | ORAL | Status: DC | PRN

## 2021-09-20 NOTE — Progress Notes (Signed)
PHARMACIST CODE STROKE RESPONSE ? ?Notified to mix TNK at 2014 by Dr. Lorrin Goodell ?Delivered TNK to RN at 2016 ? ?TNK dose = 11 mg IV over 5 seconds ? ?Issues/delays encountered (if applicable): Patient had to be weighed between the order to give and the TNK being ready. Once given to nurse, patient required labetalol to maintain adequate BP for TNK. ? ?Thank you for allowing pharmacy to participate in this patient's care. ? ?Reatha Harps, PharmD ?PGY1 Pharmacy Resident ?09/20/2021 8:25 PM ?Check [email or web address] for unit specific pharmacy number ? ?

## 2021-09-20 NOTE — H&P (Signed)
NEUROLOGY CONSULTATION NOTE   Date of service: September 20, 2021 Patient Name: Amy Wolf MRN:  [id number] DOB:  [birth year] _ _ _   _ __   _ __ _ _  __ __   _ __   __ _  History of Present Illness  Amy Wolf is a 88 y.o. female with PMH significant for hypertension, A-fib not on anticoagulation due to urothelial cancer with hematuria, DM2, CAD, glaucoma, advanced age and lives in a nursing home and requires total care who presents with sudden onset right-sided weakness and aphasia with right gaze deviation.  Patient's daughter is a Engineer, civil (consulting) and reports that she was taken off of Eliquis due to bleeding from her urinary tract back in January 2023.  She last spoke to patient at noon and CT was doing okay.  Staff at her nursing home.  Patient had 1715 and she was still her baseline.  Patient was found later with aphasia, right-sided weakness and right gaze deviation.  She was brought into the ED where a code stroke was activated.  In the ED, she was put on Bipap for mild respiratory distress.  LKW: 1715 mRS: 4 tNKASE: offered.  I discussed the benefits of TNK with about 30% chance of improvement in her symptoms.  I also discussed risks of TNKase including recurrence and worsening of bleeding through her urinary tract along with 6% chance of intracranial hemorrhage and risk of systemic bleeding for the first 24 hours.  I also spoke with patient's daughter about increased risk of bleeding given her advanced age above 46.  Patient's daughter took some time to discuss her presentation and symptoms with her sister and they made a joint decision to opt for tNKASE. They understand the much higher risk of ICH but felt that her deficit was significantly disabling and hope that improvement in her symptoms would offer her a higher quality of life going forward. This was a hard decision for them. Thrombectomy: Not offered due to poor functional baseline. I did discuss this with daughter.  Delay to tNKASE: - This was  a hard decision for patient's daughter's to make so they rightfully took time to come to a decision. - Her SBP had to be brought down below 185/105 with labetalol prior to administering tNKASE.  NIHSS components Score: Comment  1a Level of Conscious 0[x]  1[]  2[]  3[]      1b LOC Questions 0[]  1[]  2[x]       1c LOC Commands 0[]  1[]  2[x]       2 Best Gaze 0[]  1[]  2[x]       3 Visual 0[]  1[]  2[x]  3[]      4 Facial Palsy 0[]  1[]  2[x]  3[]      5a Motor Arm - left 0[]  1[x]  2[]  3[]  4[]  UN[]    5b Motor Arm - Right 0[]  1[]  2[]  3[]  4[x]  UN[]    6a Motor Leg - Left 0[]  1[]  2[x]  3[]  4[]  UN[]    6b Motor Leg - Right 0[]  1[]  2[x]  3[]  4[]  UN[]    7 Limb Ataxia 0[x]  1[]  2[]  3[]  UN[]     8 Sensory 0[]  1[]  2[x]  UN[]      9 Best Language 0[]  1[]  2[]  3[x]      10 Dysarthria 0[]  1[]  2[x]  UN[]      11 Extinct. and Inattention 0[]  1[x]  2[]       TOTAL: 27      ROS   Unable to obtain  Past History   Past Medical History:  Diagnosis Date   AICD (automatic cardioverter/defibrillator) present  Arrhythmia    Arthritis    Atherosclerosis of right carotid artery 04/01/2019   80% blockage. Last ultrasound in 12/2018. Saw vascular surgery in June 2020. Put on DPT in June (asa added by surgeon)   Benign essential HTN 04/01/2019   CAD (coronary artery disease) 04/01/2019   Cardiac arrhythmia due to congenital heart disease    CHF (congestive heart failure) (HCC) 04/01/2019   Chronic kidney disease (CKD), stage III (moderate) (HCC) 04/04/2019   Diabetes (HCC)    Glaucoma    Heart murmur    Hx of CABG 04/01/2019   Several years ago per family    Hyperlipemia    Migraines    Pacemaker    Paroxysmal A-fib (HCC) 04/01/2019   warafin in the remote past, but changed to plavix. CHADS2 score of 8%. High fall risk...    PVD (peripheral vascular disease) (HCC) 04/01/2019   Type 2 diabetes mellitus with diabetic chronic kidney disease (HCC) 04/01/2019   Urine incontinence    Valvular heart disease 04/01/2019   Mitral and tricuspid  annuloplasty    Past Surgical History:  Procedure Laterality Date   ABDOMINAL HYSTERECTOMY     BACK SURGERY  1980   CARDIAC DEFIBRILLATOR PLACEMENT     CORONARY ARTERY BYPASS GRAFT     HIP FRACTURE SURGERY  2015   PPM GENERATOR CHANGEOUT     Family History  Problem Relation Age of Onset   Cancer Father    Cancer Sister    Cancer Daughter    Social History   Socioeconomic History   Marital status: Married    Spouse name: Not on file   Number of children: 2   Years of education: Not on file   Highest education level: Not on file  Occupational History   Occupation: Retired   Tobacco Use   Smoking status: Never   Smokeless tobacco: Never  Vaping Use   Vaping Use: Never used  Substance and Sexual Activity   Alcohol use: Not Currently   Drug use: Never   Sexual activity: Not Currently  Other Topics Concern   Not on file  Social History Narrative   Lives with spouse   Does not drive    Daughter bring meals and transports   [employer] on wheels    Social Determinants of Health   Financial Resource Strain: Low Risk    Difficulty of Paying Living Expenses: Not hard at all  Food Insecurity: No Food Insecurity   Worried About Programme researcher, broadcasting/film/video in the Last Year: Never true   Barista in the Last Year: Never true  Transportation Needs: No Transportation Needs   Lack of Transportation (Medical): No   Lack of Transportation (Non-Medical): No  Physical Activity: Inactive   Days of Exercise per Week: 0 days   Minutes of Exercise per Session: 0 min  Stress: No Stress Concern Present   Feeling of Stress : Not at all  Social Connections: Moderately Isolated   Frequency of Communication with Friends and Family: More than three times a week   Frequency of Social Gatherings with Friends and Family: Once a week   Attends Religious Services: Never   Database administrator or Organizations: No   Attends Banker Meetings: Never   Marital Status: Married    Allergies  Allergen Reactions   Iodine Other (See Comments)   Erythromycin Rash   Penicillins Rash   Sulfa Antibiotics Rash    Medications  (Not in  a hospital admission)    Vitals   Vitals:   09/20/21 2034 09/20/21 2035 09/20/21 2036 09/20/21 2037  BP: (!) 151/103 (!) 141/111 (!) 148/102 (!) 142/98  Pulse: 95 (!) 148 (!) 129 67  Resp: 20 20 16  (!) 27  Temp:      TempSrc:      SpO2: 98% 100% 100% 100%  Weight:         Body mass index is 16.92 kg/m.  Physical Exam   General: Laying comfortably in bed; in no acute distress. HENT: Normal oropharynx and mucosa. Normal external appearance of ears and nose.  Neck: Supple, no pain or tenderness  CV: No JVD. No peripheral edema.  Pulmonary: Symmetric Chest rise. Normal respiratory effort.  Abdomen: Soft to touch, non-tender.  Ext: No cyanosis, edema, or deformity  Skin: No rash. Normal palpation of skin.   Musculoskeletal: Normal digits and nails by inspection. No clubbing.   Neurologic Examination  Mental status/Cognition: Alert, does not answer any orientation questions.  Speech/language: No speech, no attempts to communicate.  Does not follow commands.  Cranial nerves:   CN II Pupils equal and reactive to light, does not blink to threat on the Right.   CN III,IV,VI L gaze deviation.   CN V Corneals intact BL   CN VII R facial droop   CN VIII Does seem to turn her head toward speech but only on the left.   CN IX & X    CN XI    CN XII midline tongue but does not protrude on command.   Motor:  Muscle bulk: poor, tone flaccid in RUE. Unable to do detailed strength testing secondary to aphasia. Right upper extremity: No movement. Left upper extremity: Is able to hold off of the bed for more than 10 seconds with mild drift. Right lower extremity: Some antigravity movement. Left lower extremity: Some antigravity movement. Reflexes:  Right Left Comments  Pectoralis      Biceps (C5/6) 1 1   Brachioradialis (C5/6) 1  1    Triceps (C6/7) 1 1    Patellar (L3/4) 1 1    Achilles (S1)      Hoffman      Plantar     Jaw jerk    Sensation:  Light touch Does not regard touch in RUE or RLE.   Pin prick    Temperature    Vibration   Proprioception    Coordination/Complex Motor:  - Finger to Nose unable to assess. - HTS: unable to do - Gait: unsafe to assess.  Labs   CBC:  Recent Labs  Lab 09/20/21 1937 09/20/21 1939  WBC  --  17.3*  NEUTROABS  --  10.9*  HGB 13.3 11.7*  HCT 39.0 37.3  MCV  --  91.4  PLT  --  315    Basic Metabolic Panel:  Lab Results  Component Value Date   NA 132 (L) 09/20/2021   K 4.6 09/20/2021   CO2 24 09/20/2021   GLUCOSE 188 (H) 09/20/2021   BUN 20 09/20/2021   CREATININE 0.90 09/20/2021   CALCIUM 9.4 09/20/2021   GFRNONAA >60 09/20/2021   Lipid Panel:  Lab Results  Component Value Date   LDLCALC 89 04/01/2019   HgbA1c:  Lab Results  Component Value Date   HGBA1C 6.6 (H) 01/21/2021   Urine Drug Screen: No results found for: LABOPIA, COCAINSCRNUR, LABBENZ, AMPHETMU, THCU, LABBARB  Alcohol Level No results found for: [facility]  CT Head without contrast(Personally reviewed):  CTH was negative for a large hypodensity concerning for a large territory infarct or hyperdensity concerning for an ICH. ASPECTS of 10.  CT angio Head and Neck with contrast(Personally reviewed): L MCA M1 occlusion.  MRI Brain: pending   Impression   Amy Wolf is a 88 y.o. female with PMH significant for hypertension, A-fib not on anticoagulation due to urothelial cancer with hematuria, DM2, CAD, glaucoma, advanced age and lives in a nursing home and requires total care who presents with sudden onset right-sided weakness and aphasia with right gaze deviation.  She was within the tNKASE window so we had extensive discussion with daughters about risk and benefits of tNKASE including significantly elevated risk of ICH given her advanced age. tNKASE was given after BP control with  Labetalol.  She was not offered thrombectomy 2/2 poor functional baseline.   Primary Diagnosis:  Cerebral infarction due to embolism of  left middle cerebral artery.   Secondary Diagnosis: Essential (primary) hypertension and Chronic atrial fibrillation  Recommendations   Acute Left MCA stroke: Plan: - Frequent NeuroChecks for post tNK care per stroke unit protocol: - Initial CTH demonstrated no acute hemorrhage or mass - MRI Brain - pending - CTA - L MCA M1 occlusion. - TTE - pending - Lipid Panel: LDL - pending  - Statin: if LDL > 70 - HbA1c: pending - Antithrombotic: Start ASA 81 mg daily if 24 h CTH does not show acute hemorrhage - DVT prophylaxis: SCDs. Pharmacologic prophylaxis if 24 h CTH does not demonstrate acute hemorrhage - Systolic Blood Pressure goal: < 180 mm Hg, Diastolic Blood Presure goal < 100mm Hg. - Telemetry monitoring for arrhythmia: 72 hours - Swallow screen - ordered - PT/OT/SLP consults  Hypertensive emergency: - Goal BP as above. Hold home meds. - Cleviprex gtt along with labetalol PRN  Allergy to Iodine: Noted hives to iodine in the past. - Given IV Solumedrol 200mg  once for iodinated contrast CT angio. - did not give Benadryl given concern that it would cause somnolence and could run into airway issues.  Mild Respiratory distress: - CXR pending - Started on Bipap in the ED - Will get PCCM team to help out.   Non insulin dependent DM2 with hyperglycemia: - sliding scale insulin.  Afibb not on AC for hx of hematuria from urothelial cancer. - hold AC - Metoprolol 2.5mg  IV Q6H PRN for Heart rate above 120.  HLD: - resume home pravastatin when she can eventually swallow - LDL pending.  CKD 3A: - gentle fluids at 50cc/hr.   GERD: - continue home PPI.   DVT Ppx: Not ordered as she is post tNKASE Code Staus: confirmed with   ______________________________________________________________________  This patient is critically ill and  at significant risk of neurological worsening, death and care requires constant monitoring of vital signs, hemodynamics,respiratory and cardiac monitoring, neurological assessment, discussion with family, other specialists and medical decision making of high complexity. I spent 90 minutes of neurocritical care time  in the care of  this patient. This was time spent independent of any time provided by nurse practitioner or PA.  Erick Blinks [facility] Pager Number [phone] 09/20/2021  9:23 PM   Thank you for the opportunity to take part in the care of this patient. If you have any further questions, please contact the neurology consultation attending.  Signed,  Erick Blinks [facility] Pager Number [phone] _ _ _   _ __   _ __ _ _  __ __   _ __  __ _ ° °

## 2021-09-20 NOTE — Progress Notes (Signed)
Pt transported from ED35 to CT and back with no complications.  ?

## 2021-09-20 NOTE — ED Notes (Signed)
Pt fighting bipap mask. Trialed off bipap. MD at bedside. O2 sat remains >95%. Placed on 2LPM via [employer]. ?

## 2021-09-20 NOTE — ED Provider Notes (Signed)
[facility] NEURO/TRAUMA/SURGICAL ICU Provider Note  CSN: [id number] Arrival date & time: 09/20/21 1928  Chief Complaint(s) Altered Mental Status  HPI Amy Wolf is a 88 y.o. female with PMH CAD, CHF, AICD in place, urothelial carcinoma recently taken off of Eliquis in January 2023 who presents emergency department for evaluation of altered mental status.  Patient arrives unresponsive being bagged by EMS.  History obtained from patient's daughter who states that her last known well was approximately 1715.  She saw her mother normal at noon today and received a call at 6 PM stating that she had acutely changed in presentation with diaphoresis, difficulty breathing and a gaze preference.  On arrival, patient has right-sided hemineglect with accessory muscle use and wheezes bilaterally.  Visional history unable to be obtained due to patient's critical status.   Altered Mental Status  Past Medical History Past Medical History:  Diagnosis Date   AICD (automatic cardioverter/defibrillator) present    Arrhythmia    Arthritis    Atherosclerosis of right carotid artery 04/01/2019   80% blockage. Last ultrasound in 12/2018. Saw vascular surgery in June 2020. Put on DPT in June (asa added by surgeon)   Benign essential HTN 04/01/2019   CAD (coronary artery disease) 04/01/2019   Cardiac arrhythmia due to congenital heart disease    CHF (congestive heart failure) (HCC) 04/01/2019   Chronic kidney disease (CKD), stage III (moderate) (HCC) 04/04/2019   Diabetes (HCC)    Glaucoma    Heart murmur    Hx of CABG 04/01/2019   Several years ago per family    Hyperlipemia    Migraines    Pacemaker    Paroxysmal A-fib (HCC) 04/01/2019   warafin in the remote past, but changed to plavix. CHADS2 score of 8%. High fall risk...    PVD (peripheral vascular disease) (HCC) 04/01/2019   Type 2 diabetes mellitus with diabetic chronic kidney disease (HCC) 04/01/2019   Urine incontinence    Valvular heart disease  04/01/2019   Mitral and tricuspid annuloplasty    Patient Active Problem List   Diagnosis Date Noted   Acute ischemic left MCA stroke (HCC) 09/20/2021   Protein-calorie malnutrition, severe 05/29/2021   Acute hypoxemic respiratory failure (HCC) 05/26/2021   Hypokalemia 05/26/2021   Acute respiratory disease due to COVID-19 virus 05/26/2021   Chronic systolic CHF (congestive heart failure) (HCC) 05/26/2021   Persistent atrial fibrillation (HCC) 01/23/2020   Secondary hypercoagulable state (HCC) 01/23/2020   Chronic kidney disease (CKD), stage III (moderate) (HCC) 04/04/2019   Benign essential HTN 04/01/2019   Type 2 diabetes mellitus with diabetic chronic kidney disease (HCC) 04/01/2019   CAD (coronary artery disease) 04/01/2019   Hx of CABG 04/01/2019   Paroxysmal A-fib (HCC) 04/01/2019   Atherosclerosis of right carotid artery 04/01/2019   PVD (peripheral vascular disease) (HCC) 04/01/2019   AICD (automatic cardioverter/defibrillator) present 04/01/2019   Valvular heart disease 04/01/2019   CHF (congestive heart failure) (HCC) 04/01/2019   Glaucoma 04/01/2019   Home Medication(s) Prior to Admission medications   Medication Sig Start Date End Date Taking? Authorizing Provider  acetaminophen (TYLENOL) 500 MG tablet Take 500 mg by mouth in the morning and at bedtime.    Yes [provider]  bimatoprost (LUMIGAN) 0.01 % SOLN Place 1 drop into both eyes at bedtime.   Yes [provider]  brimonidine (ALPHAGAN) 0.15 % ophthalmic solution Place 1 drop into the right eye 2 (two) times daily.   Yes [provider]  Cholecalciferol (VITAMIN D)  50 MCG (2000 UT) CAPS Take 2,000 Units by mouth daily.   Yes [provider]  ferrous sulfate 325 (65 FE) MG tablet Take 325 mg by mouth daily with breakfast.    Yes [provider]  glipiZIDE (GLUCOTROL XL) 2.5 MG 24 hr tablet TAKE 1 TABLET BY MOUTH EVERY DAY WITH BREAKFAST Patient taking differently: 2.5  mg daily with breakfast. 04/18/21  Yes Allwardt, [name] M, PA-C  guaiFENesin-dextromethorphan (ROBITUSSIN DM) 100-10 MG/5ML syrup Take 10 mLs by mouth every 6 (six) hours as needed for cough. 06/06/21  Yes Burnadette Pop, MD  meclizine (ANTIVERT) 25 MG tablet Take 25 mg by mouth 3 (three) times daily. 06/20/21  Yes [provider]  melatonin 5 MG TABS Take 5 mg by mouth at bedtime.   Yes [provider]  metoprolol succinate (TOPROL XL) 25 MG 24 hr tablet Take 1 tablet (25 mg total) by mouth daily. 07/19/21  Yes Alver Sorrow, NP  mirtazapine (REMERON) 15 MG tablet Take 7.5 mg by mouth daily.   Yes [provider]  Netarsudil Dimesylate (RHOPRESSA) 0.02 % SOLN Place 1 drop into both eyes at bedtime.   Yes [provider]  ondansetron (ZOFRAN-ODT) 4 MG disintegrating tablet Take 4 mg by mouth every 6 (six) hours as needed for nausea or vomiting. 06/12/21  Yes [provider]  pantoprazole (PROTONIX) 40 MG tablet Take 40 mg by mouth 2 (two) times daily. 07/18/21  Yes [provider]  polyethylene glycol (MIRALAX / GLYCOLAX) 17 g packet Take 17 g by mouth daily. 06/07/21  Yes Burnadette Pop, MD  pravastatin (PRAVACHOL) 80 MG tablet TAKE 1 TABLET BY MOUTH EVERY DAY Patient taking differently: Take 80 mg by mouth at bedtime. 06/24/21  Yes Allwardt, [name] M, PA-C  senna-docusate (SENOKOT-S) 8.6-50 MG tablet Take 1 tablet by mouth 2 (two) times daily. Patient taking differently: Take 2 tablets by mouth 2 (two) times daily. 06/06/21  Yes Burnadette Pop, MD  sodium chloride 1 g tablet Take 1 g by mouth 2 (two) times daily. 07/08/21  Yes [provider]  sucralfate (CARAFATE) 1 g tablet Take 1 g by mouth 4 (four) times daily -  with meals and at bedtime.   Yes [provider]  ELIQUIS 2.5 MG TABS tablet TAKE 1 TABLET BY MOUTH TWICE A DAY Patient not taking: Reported on 09/20/2021 10/17/20   Camnitz, Andree Coss, MD  furosemide (LASIX) 20  MG tablet Take 1 tablet (20 mg total) by mouth daily. Patient not taking: Reported on 09/20/2021 06/07/21   Burnadette Pop, MD                                                                                                                                    Past Surgical History Past Surgical History:  Procedure Laterality Date   ABDOMINAL HYSTERECTOMY     BACK SURGERY  1980   CARDIAC DEFIBRILLATOR PLACEMENT  CORONARY ARTERY BYPASS GRAFT     HIP FRACTURE SURGERY  2015   PPM GENERATOR CHANGEOUT     Family History Family History  Problem Relation Age of Onset   Cancer Father    Cancer Sister    Cancer Daughter     Social History Social History   Tobacco Use   Smoking status: Never   Smokeless tobacco: Never  Vaping Use   Vaping Use: Never used  Substance Use Topics   Alcohol use: Not Currently   Drug use: Never   Allergies Iodine, Erythromycin, Penicillins, and Sulfa antibiotics  Review of Systems Review of Systems  Unable to perform ROS: Patient unresponsive   Physical Exam Vital Signs  I have reviewed the triage vital signs BP (!) 128/59   Pulse 78   Temp (!) 97 F (36.1 C) (Oral)   Resp 13   Ht 5\' 4"  (1.626 m)   Wt 44.7 kg   SpO2 97%   BMI 16.92 kg/m   Physical Exam Vitals and nursing note reviewed.  Constitutional:      General: She is not in acute distress.    Appearance: She is well-developed. She is ill-appearing and toxic-appearing.  HENT:     Head: Normocephalic and atraumatic.  Eyes:     Conjunctiva/sclera: Conjunctivae normal.  Cardiovascular:     Rate and Rhythm: Regular rhythm. Tachycardia present.     Heart sounds: No murmur heard. Pulmonary:     Effort: No respiratory distress.     Breath sounds: Wheezing present.  Abdominal:     Palpations: Abdomen is soft.     Tenderness: There is no abdominal tenderness.  Musculoskeletal:        General: No swelling.     Cervical back: Neck supple.  Skin:    General: Skin is warm and dry.      Capillary Refill: Capillary refill takes less than 2 seconds.  Neurological:     Cranial Nerves: Cranial nerve deficit present.     Motor: Weakness present.  Psychiatric:        Mood and Affect: Mood normal.    ED Results and Treatments Labs (all labs ordered are listed, but only abnormal results are displayed) Labs Reviewed  CBC - Abnormal; Notable for the following components:      Result Value   WBC 17.3 (*)    Hemoglobin 11.7 (*)    All other components within normal limits  DIFFERENTIAL - Abnormal; Notable for the following components:   Neutro Abs 10.9 (*)    Lymphs Abs 5.2 (*)    Abs Immature Granulocytes 0.12 (*)    All other components within normal limits  COMPREHENSIVE METABOLIC PANEL - Abnormal; Notable for the following components:   Sodium 132 (*)    Glucose, Bld 188 (*)    Total Bilirubin 0.2 (*)    All other components within normal limits  I-STAT CHEM 8, ED - Abnormal; Notable for the following components:   Sodium 133 (*)    Glucose, Bld 178 (*)    All other components within normal limits  I-STAT VENOUS BLOOD GAS, ED - Abnormal; Notable for the following components:   pO2, Ven 29 (*)    Acid-base deficit 3.0 (*)    Sodium 133 (*)    All other components within normal limits  RESP PANEL BY RT-PCR (FLU A&B, COVID) ARPGX2  MRSA NEXT GEN BY PCR, NASAL  ETHANOL  PROTIME-INR  APTT  RAPID URINE DRUG SCREEN, HOSP PERFORMED  URINALYSIS, ROUTINE W REFLEX MICROSCOPIC  HEMOGLOBIN A1C  LIPID PANEL  I-STAT ARTERIAL BLOOD GAS, ED  I-STAT CHEM 8, ED                                                                                                                          Radiology DG Chest Portable 1 View  Result Date: 09/20/2021 CLINICAL DATA:  Concern for aspiration. EXAM: PORTABLE CHEST 1 VIEW COMPARISON:  05/26/2021 FINDINGS: Left-sided pacemaker in place. Patient is post median sternotomy. Stable heart size and mediastinal contours. Aortic  atherosclerosis. Multiple overlying monitoring devices partially obscures assessment. There is patchy right suprahilar opacity. No pneumothorax or large pleural effusion. IMPRESSION: Patchy right suprahilar opacity may be atelectasis, aspiration, or pneumonia. Recommend radiographic follow-up to resolution. Overlying monitoring devices partially obscure assessment. Electronically Signed   By: Narda Rutherford M.D.   On: 09/20/2021 21:48   CT HEAD CODE STROKE WO CONTRAST  Result Date: 09/20/2021 CLINICAL DATA:  Code stroke EXAM: CT HEAD WITHOUT CONTRAST CT ANGIOGRAPHY OF THE HEAD AND NECK TECHNIQUE: Contiguous axial images were obtained from the base of the skull through the vertex without intravenous contrast. Multidetector CT imaging of the head and neck was performed using the standard protocol during bolus administration of intravenous contrast. Multiplanar CT image reconstructions and MIPs were obtained to evaluate the vascular anatomy. Carotid stenosis measurements (when applicable) are obtained utilizing NASCET criteria, using the distal internal carotid diameter as the denominator. RADIATION DOSE REDUCTION: This exam was performed according to the departmental dose-optimization program which includes automated exposure control, adjustment of the mA and/or kV according to patient size and/or use of iterative reconstruction technique. CONTRAST:  75mL OMNIPAQUE IOHEXOL 350 MG/ML SOLN COMPARISON:  None. FINDINGS: CT HEAD FINDINGS Brain: There is no mass, hemorrhage or extra-axial collection. There is generalized atrophy without lobar predilection. There is hypoattenuation of the periventricular white matter, most commonly indicating chronic ischemic microangiopathy. Vascular: Atherosclerotic calcification of the vertebral and internal carotid arteries at the skull base. No abnormal hyperdensity of the major intracranial arteries or dural venous sinuses. Skull: The visualized skull base, calvarium and  extracranial soft tissues are normal. Sinuses/Orbits: Partial opacification of the left sphenoid sinus. The orbits are normal. ASPECTS (Alberta Stroke Program Early CT Score) - Ganglionic level infarction (caudate, lentiform nuclei, internal capsule, insula, M1-M3 cortex): 7 - Supraganglionic infarction (M4-M6 cortex): 3 Total score (0-10 with 10 being normal): 10 CTA NECK FINDINGS SKELETON: There is no bony spinal canal stenosis. No lytic or blastic lesion. OTHER NECK: 2.5 cm left thyroid nodule. UPPER CHEST: No pneumothorax or pleural effusion. No nodules or masses. AORTIC ARCH: There is calcific atherosclerosis of the aortic arch. There is no aneurysm, dissection or hemodynamically significant stenosis of the visualized portion of the aorta. Conventional 3 vessel aortic branching pattern. The visualized proximal subclavian arteries are widely patent. RIGHT CAROTID SYSTEM: There is calcific atherosclerosis at the right ICA bifurcation without hemodynamically significant stenosis by NASCET criteria. LEFT CAROTID SYSTEM:  There is atherosclerotic calcification at the carotid bifurcation resulting in approximately 60% stenosis by NASCET criteria. VERTEBRAL ARTERIES: Codominant configuration. Both origins are clearly patent. Moderate-to-severe stenosis of the distal left P2 segment. There is severe stenosis or short segment occlusion of the left V3 V4 junction. CTA HEAD FINDINGS POSTERIOR CIRCULATION: --Vertebral arteries: Bulky calcification within the left V4 segment with severe stenosis. Moderate stenosis of the midportion of the right V4. --Inferior cerebellar arteries: Normal. --Basilar artery: Normal. --Superior cerebellar arteries: Normal. --Posterior cerebral arteries (PCA): Normal. ANTERIOR CIRCULATION: --Intracranial internal carotid arteries: Atherosclerotic calcification of the internal carotid arteries at the skull base without hemodynamically significant stenosis. --Anterior cerebral arteries (ACA):  Normal. Both A1 segments are present. Patent anterior communicating artery (a-comm). --Middle cerebral arteries (MCA): Left M1 occlusion with intermediate distal collateralization. Right M1 is patent. No right MCA stenosis. VENOUS SINUSES: As permitted by contrast timing, patent. ANATOMIC VARIANTS: None Review of the MIP images confirms the above findings. IMPRESSION: 1. No intracranial hemorrhage or mass lesion. ASPECTS is 10. 2. Left M1 occlusion with intermediate distal collateralization. 3. Moderate-to-severe stenosis of the distal left P2 segment. 4. Severe stenosis or short segment occlusion of the left vertebral artery V3-V4 junction. 5. 2.5 cm incidental left thyroid nodule. Recommend thyroid US if clinically warranted given patient age. Reference: J Am Coll Radiol. 2015 Feb;12(2): 143-50 These results were called by telephone at the time of interpretation on 09/20/2021 at 8:17 pm to provider Terrick Allred [facility] , who verbally acknowledged these results. Electronically Signed   By: Deatra Robinson M.D.   On: 09/20/2021 20:17   CT ANGIO HEAD NECK W WO CM (CODE STROKE)  Result Date: 09/20/2021 CLINICAL DATA:  Code stroke EXAM: CT HEAD WITHOUT CONTRAST CT ANGIOGRAPHY OF THE HEAD AND NECK TECHNIQUE: Contiguous axial images were obtained from the base of the skull through the vertex without intravenous contrast. Multidetector CT imaging of the head and neck was performed using the standard protocol during bolus administration of intravenous contrast. Multiplanar CT image reconstructions and MIPs were obtained to evaluate the vascular anatomy. Carotid stenosis measurements (when applicable) are obtained utilizing NASCET criteria, using the distal internal carotid diameter as the denominator. RADIATION DOSE REDUCTION: This exam was performed according to the departmental dose-optimization program which includes automated exposure control, adjustment of the mA and/or kV according to patient size and/or use of iterative  reconstruction technique. CONTRAST:  75mL OMNIPAQUE IOHEXOL 350 MG/ML SOLN COMPARISON:  None. FINDINGS: CT HEAD FINDINGS Brain: There is no mass, hemorrhage or extra-axial collection. There is generalized atrophy without lobar predilection. There is hypoattenuation of the periventricular white matter, most commonly indicating chronic ischemic microangiopathy. Vascular: Atherosclerotic calcification of the vertebral and internal carotid arteries at the skull base. No abnormal hyperdensity of the major intracranial arteries or dural venous sinuses. Skull: The visualized skull base, calvarium and extracranial soft tissues are normal. Sinuses/Orbits: Partial opacification of the left sphenoid sinus. The orbits are normal. ASPECTS (Alberta Stroke Program Early CT Score) - Ganglionic level infarction (caudate, lentiform nuclei, internal capsule, insula, M1-M3 cortex): 7 - Supraganglionic infarction (M4-M6 cortex): 3 Total score (0-10 with 10 being normal): 10 CTA NECK FINDINGS SKELETON: There is no bony spinal canal stenosis. No lytic or blastic lesion. OTHER NECK: 2.5 cm left thyroid nodule. UPPER CHEST: No pneumothorax or pleural effusion. No nodules or masses. AORTIC ARCH: There is calcific atherosclerosis of the aortic arch. There is no aneurysm, dissection or hemodynamically significant stenosis of the visualized portion of the aorta. Conventional 3 vessel  aortic branching pattern. The visualized proximal subclavian arteries are widely patent. RIGHT CAROTID SYSTEM: There is calcific atherosclerosis at the right ICA bifurcation without hemodynamically significant stenosis by NASCET criteria. LEFT CAROTID SYSTEM: There is atherosclerotic calcification at the carotid bifurcation resulting in approximately 60% stenosis by NASCET criteria. VERTEBRAL ARTERIES: Codominant configuration. Both origins are clearly patent. Moderate-to-severe stenosis of the distal left P2 segment. There is severe stenosis or short segment  occlusion of the left V3 V4 junction. CTA HEAD FINDINGS POSTERIOR CIRCULATION: --Vertebral arteries: Bulky calcification within the left V4 segment with severe stenosis. Moderate stenosis of the midportion of the right V4. --Inferior cerebellar arteries: Normal. --Basilar artery: Normal. --Superior cerebellar arteries: Normal. --Posterior cerebral arteries (PCA): Normal. ANTERIOR CIRCULATION: --Intracranial internal carotid arteries: Atherosclerotic calcification of the internal carotid arteries at the skull base without hemodynamically significant stenosis. --Anterior cerebral arteries (ACA): Normal. Both A1 segments are present. Patent anterior communicating artery (a-comm). --Middle cerebral arteries (MCA): Left M1 occlusion with intermediate distal collateralization. Right M1 is patent. No right MCA stenosis. VENOUS SINUSES: As permitted by contrast timing, patent. ANATOMIC VARIANTS: None Review of the MIP images confirms the above findings. IMPRESSION: 1. No intracranial hemorrhage or mass lesion. ASPECTS is 10. 2. Left M1 occlusion with intermediate distal collateralization. 3. Moderate-to-severe stenosis of the distal left P2 segment. 4. Severe stenosis or short segment occlusion of the left vertebral artery V3-V4 junction. 5. 2.5 cm incidental left thyroid nodule. Recommend thyroid US if clinically warranted given patient age. Reference: J Am Coll Radiol. 2015 Feb;12(2): 143-50 These results were called by telephone at the time of interpretation on 09/20/2021 at 8:17 pm to provider [facility] , who verbally acknowledged these results. Electronically Signed   By: Deatra Robinson M.D.   On: 09/20/2021 20:17    Pertinent labs & imaging results that were available during my care of the patient were reviewed by me and considered in my medical decision making (see MDM for details).  Medications Ordered in ED Medications   stroke: mapping our early stages of recovery book (has no administration in time  range)  0.9 %  sodium chloride infusion (has no administration in time range)  acetaminophen (TYLENOL) tablet 650 mg (has no administration in time range)    Or  acetaminophen (TYLENOL) 160 MG/5ML solution 650 mg (has no administration in time range)    Or  acetaminophen (TYLENOL) suppository 650 mg (has no administration in time range)  senna-docusate (Senokot-S) tablet 1 tablet (has no administration in time range)  pantoprazole (PROTONIX) injection 40 mg (has no administration in time range)  labetalol (NORMODYNE) injection 20 mg (10 mg Intravenous Given 09/20/21 2015)    And  clevidipine (CLEVIPREX) infusion 0.5 mg/mL (1 mg/hr Intravenous New Bag/Given 09/20/21 2031)  cefTRIAXone (ROCEPHIN) 1 g in sodium chloride 0.9 % 100 mL IVPB (has no administration in time range)  iohexol (OMNIPAQUE) 350 MG/ML injection 75 mL (75 mLs Intravenous Contrast Given 09/20/21 2002)  tenecteplase (TNKASE) injection for Stroke 11 mg (11 mg Intravenous Given 09/20/21 2022)  hydrocortisone sodium succinate (SOLU-CORTEF) 100 MG injection 200 mg (200 mg Intravenous Given 09/20/21 2039)  Procedures .Critical Care Performed by: Glendora Score, MD Authorized by: Glendora Score, MD   Critical care provider statement:    Critical care time (minutes):  30   Critical care was necessary to treat or prevent imminent or life-threatening deterioration of the following conditions: CVA requiring TNK.   Critical care was time spent personally by me on the following activities:  Development of treatment plan with patient or surrogate, discussions with consultants, evaluation of patient's response to treatment, examination of patient, ordering and review of laboratory studies, ordering and review of radiographic studies, ordering and performing treatments and interventions, pulse oximetry,  re-evaluation of patient's condition and review of old charts  (including critical care time)  Medical Decision Making / ED Course   This patient presents to the ED for concern of unresponsiveness, this involves an extensive number of treatment options, and is a complaint that carries with it a high risk of complications and morbidity.  The differential diagnosis includes CVA, hypercarbia, encephalopathy, aortic dissection  MDM: Patient seen emergency department for evaluation of unresponsiveness.  Physical exam reveals a toxic appearing patient with increased accessory muscle use, rales bilaterally, right-sided hemineglect and right-sided weakness.  CODE STATUS confirmed as a DNR and thus intubation not performed and we elected to place the patient on BiPAP.  Her initial i-STAT blood gas with a pH of 7.29, PCO2 of 49.9.  Due to neurodeficits, high concern for CVA and a stroke alert was called as the patient is within the 3-hour window.  Initial CT head with no hemorrhagic stroke.  CT angio showing an M1 occlusion and after discussion with the patient's daughter, we proceeded with TNK administration.  Chest x-ray then performed showing pneumonia.  Patient then admitted to the neuro ICU.  As the patient is on BiPAP with pneumonia, neurology recommending consultation with critical care who states that they will evaluate the patient and assist with respiratory status.   Additional history obtained: -Additional history obtained from daughter -External records from outside source obtained and reviewed including: Chart review including previous notes, labs, imaging, consultation notes   Lab Tests: -I ordered, reviewed, and interpreted labs.   The pertinent results include:   Labs Reviewed  CBC - Abnormal; Notable for the following components:      Result Value   WBC 17.3 (*)    Hemoglobin 11.7 (*)    All other components within normal limits  DIFFERENTIAL - Abnormal; Notable for the following  components:   Neutro Abs 10.9 (*)    Lymphs Abs 5.2 (*)    Abs Immature Granulocytes 0.12 (*)    All other components within normal limits  COMPREHENSIVE METABOLIC PANEL - Abnormal; Notable for the following components:   Sodium 132 (*)    Glucose, Bld 188 (*)    Total Bilirubin 0.2 (*)    All other components within normal limits  I-STAT CHEM 8, ED - Abnormal; Notable for the following components:   Sodium 133 (*)    Glucose, Bld 178 (*)    All other components within normal limits  I-STAT VENOUS BLOOD GAS, ED - Abnormal; Notable for the following components:   pO2, Ven 29 (*)    Acid-base deficit 3.0 (*)    Sodium 133 (*)    All other components within normal limits  RESP PANEL BY RT-PCR (FLU A&B, COVID) ARPGX2  MRSA NEXT GEN BY PCR, NASAL  ETHANOL  PROTIME-INR  APTT  RAPID URINE DRUG SCREEN, HOSP PERFORMED  URINALYSIS, ROUTINE W REFLEX MICROSCOPIC  HEMOGLOBIN A1C  LIPID PANEL  I-STAT ARTERIAL BLOOD GAS, ED  I-STAT CHEM 8, ED      EKG   EKG Interpretation  Date/Time:  Friday September 20 2021 19:32:44 EDT Ventricular Rate:  132 PR Interval:    QRS Duration: 167 QT Interval:  363 QTC Calculation: 538 R Axis:   -60 Text Interpretation: Atrial fibrillation overdrive pacing from PPM/AICD with LBBB pattern Confirmed by Blade Scheff ([id number]) on 09/20/2021 11:53:41 PM         Imaging Studies ordered: I ordered imaging studies including CT head, chest x-ray, CT angio brain and neck I independently visualized and interpreted imaging. I agree with the radiologist interpretation   Medicines ordered and prescription drug management: Meds ordered this encounter  Medications   iohexol (OMNIPAQUE) 350 MG/ML injection 75 mL   DISCONTD: tenecteplase (TNKASE) injection for Stroke 10 mg   tenecteplase (TNKASE) injection for Stroke 11 mg    stroke: mapping our early stages of recovery book   0.9 %  sodium chloride infusion   OR Linked Order Group    acetaminophen (TYLENOL)  tablet 650 mg    acetaminophen (TYLENOL) 160 MG/5ML solution 650 mg    acetaminophen (TYLENOL) suppository 650 mg   senna-docusate (Senokot-S) tablet 1 tablet   pantoprazole (PROTONIX) injection 40 mg   AND Linked Order Group    labetalol (NORMODYNE) injection 20 mg    clevidipine (CLEVIPREX) infusion 0.5 mg/mL   DISCONTD: diphenhydrAMINE (BENADRYL) injection 50 mg   hydrocortisone sodium succinate (SOLU-CORTEF) 100 MG injection 200 mg    IV hydrocortisone will be converted to either a q8h or q12h frequency with the same total daily dose (TDD).  Ordered Dose: 1 to 200 mg TDD; convert to: TDD div q12h.  Ordered Dose: 201 to 300 mg TDD; convert to: TDD div q8h.  Ordered Dose: >300 mg TDD; DAW.   cefTRIAXone (ROCEPHIN) 1 g in sodium chloride 0.9 % 100 mL IVPB    Order Specific Question:   Antibiotic Indication:    Answer:   UTI    -I have reviewed the patients home medicines and have made adjustments as needed  Critical interventions TNK administration, stroke management  Consultations Obtained: I requested consultation with the stroke team,  and discussed lab and imaging findings as well as pertinent plan - they recommend: Admission to the neuro ICU   Cardiac Monitoring: The patient was maintained on a cardiac monitor.  I personally viewed and interpreted the cardiac monitored which showed an underlying rhythm of: Paced rhythm versus atrial fibrillation with RVR  Social Determinants of Health:  Factors impacting patients care include: Lives in facility   Reevaluation: After the interventions noted above, I reevaluated the patient and found that they have :stayed the same  Co morbidities that complicate the patient evaluation  Past Medical History:  Diagnosis Date   AICD (automatic cardioverter/defibrillator) present    Arrhythmia    Arthritis    Atherosclerosis of right carotid artery 04/01/2019   80% blockage. Last ultrasound in 12/2018. Saw vascular surgery in June 2020.  Put on DPT in June (asa added by surgeon)   Benign essential HTN 04/01/2019   CAD (coronary artery disease) 04/01/2019   Cardiac arrhythmia due to congenital heart disease    CHF (congestive heart failure) (HCC) 04/01/2019   Chronic kidney disease (CKD), stage III (moderate) (HCC) 04/04/2019   Diabetes (HCC)    Glaucoma    Heart murmur    Hx of CABG 04/01/2019   Several years  ago per family    Hyperlipemia    Migraines    Pacemaker    Paroxysmal A-fib (HCC) 04/01/2019   warafin in the remote past, but changed to plavix. CHADS2 score of 8%. High fall risk...    PVD (peripheral vascular disease) (HCC) 04/01/2019   Type 2 diabetes mellitus with diabetic chronic kidney disease (HCC) 04/01/2019   Urine incontinence    Valvular heart disease 04/01/2019   Mitral and tricuspid annuloplasty       Dispostion: I considered admission for this patient, and due to poor respiratory status in the setting of an active CVA getting TNK will require admission     Final Clinical Impression(s) / ED Diagnoses Final diagnoses:  None     @PCDICTATION @    Glendora Score, MD 09/20/21 2354

## 2021-09-20 NOTE — Consult Note (Signed)
? ?NAMEDeriona Wolf, MRN:  [id number], DOB:  December 30, 1927, LOS: 0 ?ADMISSION DATE:  09/20/2021, CONSULTATION DATE: September 20, 2021 ?REFERRING MD: ED, CHIEF COMPLAINT: Acute respiratory failure ? ?History of Present Illness:  ?Patient not able to give any history history was obtained by the daughter by the bedside. ?Patient is 88 year old female past medical history of hypertension A-fib on anticoagulation rectal cancer with hematuria coronary artery disease diabetes who had COVID back in December she never made a full recovery she has had A-fib on Eliquis today they found her unresponsive she had urethral cancer and she is DNR she had aphasia she had right-sided weakness and right gaze deviation. ?Objective   ?Blood pressure 137/75, pulse (!) 44, temperature (!) 97 ?F (36.1 ?C), temperature source Oral, resp. rate 16, height '5\' 4"'$  (1.626 m), weight 44.7 kg, SpO2 100 %. ?   ?   ?No intake or output data in the 24 hours ending 09/20/21 2130 ?Filed Weights  ? 09/20/21 1945 09/20/21 2000 09/20/21 2050  ?Weight: 40 kg 44.7 kg 44.7 kg  ? ? ?Examination: ?General: Right gaze deviation GCS is 9 ?Neuro: Left-sided neglect right gaze deviation and right-sided weakness overall generalized weakness HEENT:  atraumatic , no jaundice , dry mucous membranes  ?Cardiovascular:  Irregular irregular , ESM 2/6 in the aortic area  ?Lungs:  CTA bilateral , no wheezing or crackles  ?Abdomen:  Soft lax +BS , no tenderness . ?Musculoskeletal:  WNL , normal pulses  ?Skin:  No rash   ? ? ?Assessment & Plan:  ? ?-- Acute respiratory failure most likely aspiration.  The patient ceftriaxone continue with BiPAP remove accordingly and clinically. ?--Stroke get tPA managed by neuro she is on Cardene. ?--She is on anticoagulation unable to take it. ?--Had a long discussion with the daughter was at bedside we will continue with DNR given multiple comorbidities including cancer and consideration of comfort care if no improvement down the line.  At this  point patient seem to be in mild distress continue with BiPAP start antibiotic we will add DuoNebs. ? ?Labs   ?CBC: ?Recent Labs  ?Lab 09/20/21 ?1935 09/20/21 ?1937 09/20/21 ?1939  ?WBC  --   --  17.3*  ?NEUTROABS  --   --  10.9*  ?HGB 13.3 13.3 11.7*  ?HCT 39.0 39.0 37.3  ?MCV  --   --  91.4  ?PLT  --   --  315  ? ? ?Basic Metabolic Panel: ?Recent Labs  ?Lab 09/20/21 ?1935 09/20/21 ?1937 09/20/21 ?1939  ?NA 133* 133* 132*  ?K 4.6 4.6 4.6  ?CL 99  --  98  ?CO2  --   --  24  ?GLUCOSE 178*  --  188*  ?BUN 22  --  20  ?CREATININE 0.90  --  0.90  ?CALCIUM  --   --  9.4  ? ?GFR: ?Estimated Creatinine Clearance: 28.7 mL/min (by C-G formula based on SCr of 0.9 mg/dL). ?Recent Labs  ?Lab 09/20/21 ?1939  ?WBC 17.3*  ? ? ?Liver Function Tests: ?Recent Labs  ?Lab 09/20/21 ?1939  ?AST 38  ?ALT 25  ?ALKPHOS 54  ?BILITOT 0.2*  ?PROT 7.3  ?ALBUMIN 3.5  ? ?No results for input(s): LIPASE, AMYLASE in the last 168 hours. ?No results for input(s): AMMONIA in the last 168 hours. ? ?ABG ?   ?Component Value Date/Time  ? HCO3 24.3 09/20/2021 1937  ? TCO2 26 09/20/2021 1937  ? ACIDBASEDEF 3.0 (H) 09/20/2021 1937  ? O2SAT 46 09/20/2021 1937  ?  ? ?  Coagulation Profile: ?Recent Labs  ?Lab 09/20/21 ?1939  ?INR 1.1  ? ? ?Cardiac Enzymes: ?No results for input(s): CKTOTAL, CKMB, CKMBINDEX, TROPONINI in the last 168 hours. ? ?HbA1C: ?Hgb A1c MFr Bld  ?Date/Time Value Ref Range Status  ?01/21/2021 03:59 PM 6.6 (H) 4.6 - 6.5 % Final  ?  Comment:  ?  Glycemic Control Guidelines for People with Diabetes:Non Diabetic:  <6%Goal of Therapy: <7%Additional Action Suggested:  >8%   ?07/05/2020 02:08 PM 6.5 4.6 - 6.5 % Final  ?  Comment:  ?  Glycemic Control Guidelines for People with Diabetes:Non Diabetic:  <6%Goal of Therapy: <7%Additional Action Suggested:  >8%   ? ? ?CBG: ?No results for input(s): GLUCAP in the last 168 hours. ? ?Review of Systems:   ?Unable to obtain due to patient condition ?Past Medical History:  ?She,  has a past medical history of  AICD (automatic cardioverter/defibrillator) present, Arrhythmia, Arthritis, Atherosclerosis of right carotid artery (04/01/2019), Benign essential HTN (04/01/2019), CAD (coronary artery disease) (04/01/2019), Cardiac arrhythmia due to congenital heart disease, CHF (congestive heart failure) ([place]) (04/01/2019), Chronic kidney disease (CKD), stage III (moderate) ([place]) (04/04/2019), Diabetes ([place]), Glaucoma, Heart murmur, CABG (04/01/2019), Hyperlipemia, Migraines, Pacemaker, Paroxysmal A-fib ([place]) (04/01/2019), PVD (peripheral vascular disease) ([place]) (04/01/2019), Type 2 diabetes mellitus with diabetic chronic kidney disease ([place]) (04/01/2019), Urine incontinence, and Valvular heart disease (04/01/2019).  ? ?Surgical History:  ? ?Past Surgical History:  ?Procedure Laterality Date  ? ABDOMINAL HYSTERECTOMY    ? [place]  ? CARDIAC DEFIBRILLATOR PLACEMENT    ? CORONARY ARTERY BYPASS GRAFT    ? HIP FRACTURE SURGERY  2015  ? PPM GENERATOR CHANGEOUT    ?  ? ?Social History:  ? reports that she has never smoked. She has never used smokeless tobacco. She reports that she does not currently use alcohol. She reports that she does not use drugs.  ? ?Family History:  ?Her family history includes Cancer in her daughter, father, and sister.  ? ?Allergies ?Allergies  ?Allergen Reactions  ? Iodine Other (See Comments)  ? Erythromycin Rash  ? Penicillins Rash  ? Sulfa Antibiotics Rash  ?  ? ?Home Medications  ?Prior to Admission medications   ?Medication Sig Start Date End Date Taking? Authorizing Provider  ?acetaminophen (TYLENOL) 500 MG tablet Take 500 mg by mouth in the morning and at bedtime.     [provider]  ?bimatoprost (LUMIGAN) 0.01 % SOLN Place 1 drop into both eyes at bedtime.    [provider]  ?brimonidine (ALPHAGAN) 0.15 % ophthalmic solution Place 1 drop into the right eye 2 (two) times daily.    [provider]  ?ELIQUIS 2.5 MG TABS tablet TAKE 1 TABLET BY MOUTH TWICE A DAY 10/17/20    Camnitz, Ocie Doyne, MD  ?ferrous sulfate 325 (65 FE) MG tablet Take 325 mg by mouth daily with breakfast.     [provider]  ?furosemide (LASIX) 20 MG tablet Take 1 tablet (20 mg total) by mouth daily. 06/07/21   Shelly Coss, MD  ?glipiZIDE (GLUCOTROL XL) 2.5 MG 24 hr tablet TAKE 1 TABLET BY MOUTH EVERY DAY WITH BREAKFAST ?Patient taking differently: 2.5 mg daily with breakfast. 04/18/21   Allwardt, [name] M, PA-C  ?guaiFENesin-dextromethorphan (ROBITUSSIN DM) 100-10 MG/5ML syrup Take 10 mLs by mouth every 6 (six) hours as needed for cough. 06/06/21   Shelly Coss, MD  ?meclizine (ANTIVERT) 25 MG tablet Take by mouth. 06/20/21   [provider]  ?Melatonin 3 MG  CAPS Take 3 mg by mouth at bedtime.     [provider]  ?metoprolol succinate (TOPROL XL) 25 MG 24 hr tablet Take 1 tablet (25 mg total) by mouth daily. 07/19/21   Loel Dubonnet, NP  ?Netarsudil Dimesylate (RHOPRESSA) 0.02 % SOLN Place 1 drop into both eyes at bedtime.    [provider]  ?ondansetron (ZOFRAN-ODT) 4 MG disintegrating tablet Take by mouth. 06/12/21   [provider]  ?pantoprazole (PROTONIX) 40 MG tablet Take 40 mg by mouth daily. 07/18/21   [provider]  ?polyethylene glycol (MIRALAX / GLYCOLAX) 17 g packet Take 17 g by mouth daily. 06/07/21   Shelly Coss, MD  ?pravastatin (PRAVACHOL) 80 MG tablet TAKE 1 TABLET BY MOUTH EVERY DAY 06/24/21   Allwardt, [name] M, PA-C  ?senna-docusate (SENOKOT-S) 8.6-50 MG tablet Take 1 tablet by mouth 2 (two) times daily. 06/06/21   Shelly Coss, MD  ?sodium chloride 1 g tablet Take 1 g by mouth 2 (two) times daily. 07/08/21   [provider]  ?  ? ? ? ?  ?

## 2021-09-20 NOTE — Progress Notes (Signed)
eLink Physician-Brief Progress Note ?Patient Name: Mirinda Monte ?DOB: 09-10-1927 ?MRN: [id number] ? ? ?Date of Service ? 09/20/2021  ?HPI/Events of Note ? 88 yr old female NHR with HTN and afib not on anticoag b/c of urothelial cancer presents with acute left MCA CVA.  S/p TPA.  Concern for aspiration.  On low flow [place].  She is DNR.  ?eICU Interventions ? Chart reviewed  ? ? ? ?Intervention Category ?Evaluation Type: New Patient Evaluation ? Mauri Brooklyn, P ?09/20/2021, 10:44 PM ?

## 2021-09-21 DIAGNOSIS — I4891 Unspecified atrial fibrillation: Secondary | ICD-10-CM | POA: Diagnosis not present

## 2021-09-21 DIAGNOSIS — J9601 Acute respiratory failure with hypoxia: Secondary | ICD-10-CM

## 2021-09-21 DIAGNOSIS — I63512 Cerebral infarction due to unspecified occlusion or stenosis of left middle cerebral artery: Secondary | ICD-10-CM | POA: Diagnosis not present

## 2021-09-21 DIAGNOSIS — R0603 Acute respiratory distress: Secondary | ICD-10-CM

## 2021-09-21 DIAGNOSIS — Z515 Encounter for palliative care: Secondary | ICD-10-CM

## 2021-09-21 DIAGNOSIS — R310 Gross hematuria: Secondary | ICD-10-CM

## 2021-09-21 DIAGNOSIS — R6889 Other general symptoms and signs: Secondary | ICD-10-CM

## 2021-09-21 DIAGNOSIS — Z66 Do not resuscitate: Secondary | ICD-10-CM | POA: Diagnosis not present

## 2021-09-21 DIAGNOSIS — Z7189 Other specified counseling: Secondary | ICD-10-CM | POA: Diagnosis not present

## 2021-09-21 LAB — CBC
HCT: 36.9 % (ref 36.0–46.0)
Hemoglobin: 11.7 g/dL — ABNORMAL LOW (ref 12.0–15.0)
MCH: 28.6 pg (ref 26.0–34.0)
MCHC: 31.7 g/dL (ref 30.0–36.0)
MCV: 90.2 fL (ref 80.0–100.0)
Platelets: 293 10*3/uL (ref 150–400)
RBC: 4.09 MIL/uL (ref 3.87–5.11)
RDW: 14.3 % (ref 11.5–15.5)
WBC: 17.8 10*3/uL — ABNORMAL HIGH (ref 4.0–10.5)
nRBC: 0 % (ref 0.0–0.2)

## 2021-09-21 LAB — GLUCOSE, CAPILLARY
Glucose-Capillary: 364 mg/dL — ABNORMAL HIGH (ref 70–99)
Glucose-Capillary: 266 mg/dL — ABNORMAL HIGH (ref 70–99)

## 2021-09-21 LAB — LIPID PANEL
Cholesterol: 184 mg/dL (ref 0–200)
HDL: 73 mg/dL (ref >40)
LDL Cholesterol: 95 mg/dL (ref 0–99)
Total CHOL/HDL Ratio: 2.5 RATIO
Triglycerides: 79 mg/dL (ref <150)
VLDL: 16 mg/dL (ref 0–40)

## 2021-09-21 LAB — BASIC METABOLIC PANEL
Anion gap: 14 (ref 5–15)
BUN: 26 mg/dL — ABNORMAL HIGH (ref 8–23)
CO2: 19 mmol/L — ABNORMAL LOW (ref 22–32)
Calcium: 9.7 mg/dL (ref 8.9–10.3)
Chloride: 99 mmol/L (ref 98–111)
Creatinine, Ser: 1.08 mg/dL — ABNORMAL HIGH (ref 0.44–1.00)
GFR, Estimated: 48 mL/min — ABNORMAL LOW (ref >60)
Glucose, Bld: 378 mg/dL — ABNORMAL HIGH (ref 70–99)
Potassium: 5 mmol/L (ref 3.5–5.1)
Sodium: 132 mmol/L — ABNORMAL LOW (ref 135–145)

## 2021-09-21 LAB — MRSA NEXT GEN BY PCR, NASAL: MRSA by PCR Next Gen: NOT DETECTED

## 2021-09-21 LAB — HEMOGLOBIN A1C
Hgb A1c MFr Bld: 5.3 % (ref 4.8–5.6)
Mean Plasma Glucose: 105.41 mg/dL

## 2021-09-21 MED ORDER — FENTANYL CITRATE PF 50 MCG/ML IJ SOSY: 25.0000 ug | PREFILLED_SYRINGE | INTRAMUSCULAR | Status: DC | PRN

## 2021-09-21 MED ORDER — GLYCOPYRROLATE 0.2 MG/ML IJ SOLN: 0.2000 mg | INTRAMUSCULAR | Status: DC | PRN

## 2021-09-21 MED ORDER — BIOTENE DRY MOUTH MT LIQD
15.0000 mL | Freq: Three times a day (TID) | OROMUCOSAL | Status: DC
  Administered 2021-09-21 (×2): 15 mL via TOPICAL

## 2021-09-21 MED ORDER — HALOPERIDOL LACTATE 5 MG/ML IJ SOLN: 2.0000 mg | INTRAMUSCULAR | Status: DC | PRN

## 2021-09-21 MED ORDER — HALOPERIDOL 1 MG PO TABS
2.0000 mg | ORAL_TABLET | ORAL | Status: DC | PRN
  Filled 2021-09-21: qty 2

## 2021-09-21 MED ORDER — LORAZEPAM 2 MG/ML PO CONC: 1.0000 mg | ORAL | Status: DC | PRN

## 2021-09-21 MED ORDER — POLYVINYL ALCOHOL 1.4 % OP SOLN
1.0000 [drp] | Freq: Four times a day (QID) | OPHTHALMIC | Status: DC | PRN
  Administered 2021-09-21: 1 [drp] via OPHTHALMIC
  Filled 2021-09-21: qty 15

## 2021-09-21 MED ORDER — MORPHINE SULFATE (PF) 2 MG/ML IV SOLN
2.0000 mg | INTRAVENOUS | Status: DC | PRN
  Administered 2021-09-21 – 2021-09-22 (×2): 2 mg via INTRAVENOUS
  Filled 2021-09-21 (×2): qty 1

## 2021-09-21 MED ORDER — POLYETHYLENE GLYCOL 3350 17 G PO PACK: 17.0000 g | PACK | Freq: Every day | ORAL | Status: DC

## 2021-09-21 MED ORDER — DOCUSATE SODIUM 50 MG/5ML PO LIQD: 100.0000 mg | Freq: Two times a day (BID) | ORAL | Status: DC

## 2021-09-21 MED ORDER — LORAZEPAM 1 MG PO TABS: 1.0000 mg | ORAL_TABLET | ORAL | Status: DC | PRN

## 2021-09-21 MED ORDER — CHLORHEXIDINE GLUCONATE CLOTH 2 % EX PADS
6.0000 | MEDICATED_PAD | Freq: Every day | CUTANEOUS | Status: DC
  Administered 2021-09-21: 6 via TOPICAL

## 2021-09-21 MED ORDER — METOPROLOL TARTRATE 5 MG/5ML IV SOLN
5.0000 mg | Freq: Four times a day (QID) | INTRAVENOUS | Status: DC
  Administered 2021-09-21 – 2021-09-22 (×4): 5 mg via INTRAVENOUS
  Filled 2021-09-21 (×4): qty 5

## 2021-09-21 MED ORDER — GLYCOPYRROLATE 0.2 MG/ML IJ SOLN
0.2000 mg | INTRAMUSCULAR | Status: DC | PRN
  Administered 2021-09-21: 0.2 mg via INTRAVENOUS
  Filled 2021-09-21: qty 1

## 2021-09-21 MED ORDER — HALOPERIDOL LACTATE 2 MG/ML PO CONC: 2.0000 mg | ORAL | Status: DC | PRN

## 2021-09-21 MED ORDER — LORAZEPAM 2 MG/ML IJ SOLN: 1.0000 mg | INTRAMUSCULAR | Status: DC | PRN

## 2021-09-21 MED ORDER — IPRATROPIUM-ALBUTEROL 0.5-2.5 (3) MG/3ML IN SOLN: 3.0000 mL | RESPIRATORY_TRACT | Status: DC | PRN

## 2021-09-21 MED ORDER — PROPOFOL 1000 MG/100ML IV EMUL: 0.0000 ug/kg/min | INTRAVENOUS | Status: DC

## 2021-09-21 MED ORDER — GLYCOPYRROLATE 1 MG PO TABS
1.0000 mg | ORAL_TABLET | ORAL | Status: DC | PRN
  Filled 2021-09-21: qty 1

## 2021-09-21 MED ORDER — INSULIN ASPART 100 UNIT/ML IJ SOLN
0.0000 [IU] | INTRAMUSCULAR | Status: DC
  Administered 2021-09-21: 7 [IU] via SUBCUTANEOUS
  Administered 2021-09-21: 5 [IU] via SUBCUTANEOUS

## 2021-09-21 NOTE — Progress Notes (Signed)
? ?NAMEDierdre Wolf, MRN:  [id number], DOB:  Sep 24, 1927, LOS: 1 ?ADMISSION DATE:  09/20/2021, CONSULTATION DATE:  3/17 ?REFERRING MD:  EDP, CHIEF COMPLAINT:  Acute respiratory failure with hypoxemia  ? ?History of Present Illness:  ?88 y/o female with a complex medical history presented with right sided weakness, right side gaze deviation and altered mental status.  CT Head showed left MCA M1 occlusion, TNKASE was administered. ? ?Pertinent  Medical History  ?AICD ?Arrhythmia ?Arthritis ?Hypertension ?CAD ?DM2 ?History of CABG ?Proxysmal A fib ?Hyperlipidemia ? ?Significant Hospital Events: ?Including procedures, antibiotic start and stop dates in addition to other pertinent events   ?3/17 admitted for L MCA stroke, given TNKASE, CTA head/neck showed L M1 occlusion, moderate to severe stnoesis of left P2 and occlusion left vertebral artery ? ?Micro ?3/17 sars cov 2/flu neg ?Interim History / Subjective:  ? ?Received TNKASE overnight ?Not communicating verbally, following commands, or making purposeful movement ?Loud rhonchi/inability to handle secretions well noted by nursing requiring oral suctioning ? ?Objective   ?Blood pressure (!) 170/89, pulse (!) 143, temperature (!) 97.5 ?F (36.4 ?C), temperature source Axillary, resp. rate (!) 24, height '5\' 4"'$  (1.626 m), weight 39.4 kg, SpO2 90 %. ?   ?FiO2 (%):  [60 %] 60 % ?Set Rate:  [16 bmp] 16 bmp ?PEEP:  [5 cmH20] 5 cmH20  ? ?Intake/Output Summary (Last 24 hours) at 09/21/2021 0800 ?Last data filed at 09/21/2021 0600 ?Gross per 24 hour  ?Intake 20.47 ml  ?Output 50 ml  ?Net -29.53 ml  ? ?Filed Weights  ? 09/20/21 2000 09/20/21 2050 09/21/21 0200  ?Weight: 44.7 kg 44.7 kg 39.4 kg  ? ? ?Examination: ? ?General:  Frail, elderly female resting in bed ?HENT: Bruising over right eye, OP clear ?PULM: Upper airway rhonchi, normal effort ?CV: Tachy, irreg irreg, no mgr ?GI: BS+, soft, nontender ?MSK: normal bulk and tone ?Neuro: eyes open, no response to voice, doesn't follow  commands, flex response to pain right upper ext, no response to pain in left upper ext ? ? ?Resolved Hospital Problem list   ? ? ?Assessment & Plan:  ?Acute left MCA stroke with associated hemiparesis and acute encephalopathy POA ?Inability to protect airway due to stroke ?Atrial fibrillation with RVR ?Ulcer of right great toe, POA ?History of CAD, s/p CABG ?Frailty ?Protein calorie malnutrition ?Severe physical deconditioning ?DM2 ?Aspiration pneumonia right lower lobe, organism unspecified ? ?Discussion: ?88 y/o female s/p left MCA stroke treated with TNKASE, decreased level of conciousness post stroke with inability to protect airway despite aggressive treatment.  Very high likelihood of death from this condition. ? ?Plan: ?Continue post stroke monitoring in ICU setting ?Tele ?Add metoprolol '5mg'$  IV q6h ?Continue ceftriaxone 5 days ?NPO ?Frequent suctioning oropharynx/oral care protocol ?May need NTS suctioning ?Add SSI orderset, goal glucose 140-180 ?Continue cleviprex for BP goal specified by neurology (SBP < 180) ?Anticipate goals of care conversation today between family and stroke service.  PCCM would support comfort measures given the current clinical context.   ? ?Best Practice (right click and "Reselect all SmartList Selections" daily)  ? ?Diet/type: NPO ?DVT prophylaxis: SCD ?GI prophylaxis: N/A ?Lines: N/A ?Foley:  N/A ?Code Status:  DNR ?Last date of multidisciplinary goals of care discussion [per stroke service] ? ?Labs   ?CBC: ?Recent Labs  ?Lab 09/20/21 ?1935 09/20/21 ?1937 09/20/21 ?1939  ?WBC  --   --  17.3*  ?NEUTROABS  --   --  10.9*  ?HGB 13.3 13.3 11.7*  ?HCT 39.0 39.0  37.3  ?MCV  --   --  91.4  ?PLT  --   --  315  ? ? ?Basic Metabolic Panel: ?Recent Labs  ?Lab 09/20/21 ?1935 09/20/21 ?1937 09/20/21 ?1939  ?NA 133* 133* 132*  ?K 4.6 4.6 4.6  ?CL 99  --  98  ?CO2  --   --  24  ?GLUCOSE 178*  --  188*  ?BUN 22  --  20  ?CREATININE 0.90  --  0.90  ?CALCIUM  --   --  9.4  ? ?GFR: ?Estimated  Creatinine Clearance: 25.3 mL/min (by C-G formula based on SCr of 0.9 mg/dL). ?Recent Labs  ?Lab 09/20/21 ?1939  ?WBC 17.3*  ? ? ?Liver Function Tests: ?Recent Labs  ?Lab 09/20/21 ?1939  ?AST 38  ?ALT 25  ?ALKPHOS 54  ?BILITOT 0.2*  ?PROT 7.3  ?ALBUMIN 3.5  ? ?No results for input(s): LIPASE, AMYLASE in the last 168 hours. ?No results for input(s): AMMONIA in the last 168 hours. ? ?ABG ?   ?Component Value Date/Time  ? HCO3 24.3 09/20/2021 1937  ? TCO2 26 09/20/2021 1937  ? ACIDBASEDEF 3.0 (H) 09/20/2021 1937  ? O2SAT 46 09/20/2021 1937  ?  ? ?Coagulation Profile: ?Recent Labs  ?Lab 09/20/21 ?1939  ?INR 1.1  ? ? ?Cardiac Enzymes: ?No results for input(s): CKTOTAL, CKMB, CKMBINDEX, TROPONINI in the last 168 hours. ? ?HbA1C: ?Hgb A1c MFr Bld  ?Date/Time Value Ref Range Status  ?09/21/2021 05:01 AM 5.3 4.8 - 5.6 % Final  ?  Comment:  ?  (NOTE) ?Pre diabetes:          5.7%-6.4% ? ?Diabetes:              >6.4% ? ?Glycemic control for   <7.0% ?adults with diabetes ?  ?01/21/2021 03:59 PM 6.6 (H) 4.6 - 6.5 % Final  ?  Comment:  ?  Glycemic Control Guidelines for People with Diabetes:Non Diabetic:  <6%Goal of Therapy: <7%Additional Action Suggested:  >8%   ? ? ?CBG: ?No results for input(s): GLUCAP in the last 168 hours. ? ?Critical care time: 33 minutes ?  ? ? ?Roselie Awkward, MD ?[facility] ?Pager: [phone] ?Cell: [phone] ?After 7:00 pm call Elink  [phone] ? ?

## 2021-09-21 NOTE — Progress Notes (Signed)
[name] updated on transfer to [phone].  Patient with belongings of blanket only.  Per daughter, okay to call with notification if patient passes throughout night. Report given to night RN.  ?

## 2021-09-21 NOTE — Progress Notes (Addendum)
[place] for ICD to be reprogram to "off"; local rep to call back/arrive to unit today. ? ?1500 [employer] Rep Leanna Sato called back; to come to bedside today. ?

## 2021-09-21 NOTE — Progress Notes (Signed)
SLP Cancellation Note ? ?Patient Details ?Name: Amy Wolf ?MRN: [id number] ?DOB: 26-Aug-1927 ? ? ?Cancelled treatment:       Reason Eval/Treat Not Completed: Medical issues which prohibited therapy. Per RN, not responsive enough for evaluation.  ? ?Gabriel Rainwater MA, CCC-SLP ? ? ? ?[name] ?09/21/2021, 12:24 PM ?

## 2021-09-21 NOTE — Consult Note (Addendum)
?Consultation Note ?Date: 09/21/2021  ? ?Patient Name: Amy Wolf  ?DOB: [birth year]  MRN: [id number]  Age / Sex: 88 y.o., female  ?PCP: Allwardt, Randa Evens, PA-C ?Referring Physician: Stroke, Md, MD ? ?Reason for Consultation: Establishing goals of care ? ?HPI/Patient Profile: 88 y.o. female  with past medical history of A-fib, CAD, CABG, ICD, CHF, hypertension, COVID, and urothelial cancer admitted on 09/20/2021 with L MCA stroke, given TNKASE, CTA head/neck showed L M1 occlusion, moderate to severe stnoesis of left P2 and occlusion left vertebral artery.  Patient unable to follow commands and unable to manage secretions.  PMT consulted to discuss goals of care. ? ?Clinical Assessment and Goals of Care: ?I have reviewed medical records including EPIC notes, labs and imaging, received report from RN and neurology NP, assessed the patient and then met with patient's 2 daughters to discuss diagnosis prognosis, GOC, EOL wishes, disposition and options. ? ?I introduced Palliative Medicine as specialized medical care for people living with serious illness. It focuses on providing relief from the symptoms and stress of a serious illness. The goal is to improve quality of life for both the patient and the family. ? ?Family tells me that patient had [place] in November and has declined since that time.  She was hospitalized for COVID and discharged to rehab but did poorly in rehab; after rehab she was transition to long-term care at the facility.  Daughters tell me her spouse passed away in 27-Jun-2023.  Since that time she has had little interest in any activities and spent most of her time in bed.  In January she was found to have a bladder tumor but no treatment was pursued.  Her anticoagulation was stopped at that time.  Daughters tell me they are not surprised by current situation. ? ? We discussed patient's current illness and what it means in the larger context of patient's  on-going co-morbidities.  Natural disease trajectory and expectations at EOL were discussed.  Daughters understand patient is at end-of-life and would like to focus on her comfort. ? ?We discussed what measures we would continue and what measures we would stop-discussed the focus on what ever promotes comfort most.  Daughters are interested in continuing metoprolol because they worry her rapid heart rate would be uncomfortable. ? ?Hospice was explained and offered.  We discussed patient is likely eligible for hospice facility if she is not managing her secretions and will likely not be able to take in any p.o.'s.  Daughter shares her father passed away at hospice of the [place] and they would like patient there as well.  We discussed philosophy of hospice care and they are understanding. ? ?Questions and concerns were addressed. The family was encouraged to call with questions or concerns. ? ?Primary Decision Maker ?HCPOA-daughter [name] ?  ? ?SUMMARY OF RECOMMENDATIONS   ?-Comfort measures only-we will continue IV metoprolol for now as daughters are concerned rapid heart rate will be uncomfortable ?-Dose of IV Robinul now  ?-Added as needed morphine, Ativan, Haldol, Robinul ?-Moved to 6 N. ?-Requested bed at [facility]-no beds today ?-Deactivate ICD ? ?Code Status/Advance Care Planning: ?DNR ? ?Prognosis:  ?< 2 weeks ? ?Discharge Planning: Hospice facility  ? ?  ? ?Primary Diagnoses: ?Present on Admission: ? Acute ischemic left MCA stroke ([place]) ? ? ?I have reviewed the medical record, interviewed the patient and family, and examined the patient. The following aspects are pertinent. ? ?Past Medical History:  ?Diagnosis Date  ? AICD (  automatic cardioverter/defibrillator) present   ? Arrhythmia   ? Arthritis   ? Atherosclerosis of right carotid artery 04/01/2019  ? 80% blockage. Last ultrasound in 12/2018. Saw vascular surgery in June 2020. Put on DPT in June (asa added by surgeon)  ? Benign  essential HTN 04/01/2019  ? CAD (coronary artery disease) 04/01/2019  ? Cardiac arrhythmia due to congenital heart disease   ? CHF (congestive heart failure) ([place]) 04/01/2019  ? Chronic kidney disease (CKD), stage III (moderate) ([place]) 04/04/2019  ? Diabetes ([place])   ? Glaucoma   ? Heart murmur   ? Hx of CABG 04/01/2019  ? Several years ago per family   ? Hyperlipemia   ? Migraines   ? Pacemaker   ? Paroxysmal A-fib ([place]) 04/01/2019  ? warafin in the remote past, but changed to plavix. CHADS2 score of 8%. High fall risk...   ? PVD (peripheral vascular disease) ([place]) 04/01/2019  ? Type 2 diabetes mellitus with diabetic chronic kidney disease ([place]) 04/01/2019  ? Urine incontinence   ? Valvular heart disease 04/01/2019  ? Mitral and tricuspid annuloplasty   ? ?Social History  ? ?Socioeconomic History  ? Marital status: Married  ?  Spouse name: Not on file  ? Number of children: 2  ? Years of education: Not on file  ? Highest education level: Not on file  ?Occupational History  ? Occupation: Retired   ?Tobacco Use  ? Smoking status: Never  ? Smokeless tobacco: Never  ?Vaping Use  ? Vaping Use: Never used  ?Substance and Sexual Activity  ? Alcohol use: Not Currently  ? Drug use: Never  ? Sexual activity: Not Currently  ?Other Topics Concern  ? Not on file  ?Social History Narrative  ? Lives with spouse  ? Does not drive   ? Daughter bring meals and transports  ? Receives meals on wheels   ? ?Social Determinants of Health  ? ?Financial Resource Strain: Low Risk   ? Difficulty of Paying Living Expenses: Not hard at all  ?Food Insecurity: No Food Insecurity  ? Worried About Charity fundraiser in the Last Year: Never true  ? Ran Out of Food in the Last Year: Never true  ?Transportation Needs: No Transportation Needs  ? Lack of Transportation (Medical): No  ? Lack of Transportation (Non-Medical): No  ?Physical Activity: Inactive  ? Days of Exercise per Week: 0 days  ? Minutes of Exercise per Session: 0 min  ?Stress: No Stress Concern  Present  ? Feeling of Stress : Not at all  ?Social Connections: Moderately Isolated  ? Frequency of Communication with Friends and Family: More than three times a week  ? Frequency of Social Gatherings with Friends and Family: Once a week  ? Attends Religious Services: Never  ? Active Member of Clubs or Organizations: No  ? Attends Archivist Meetings: Never  ? Marital Status: Married  ? ?Family History  ?Problem Relation Age of Onset  ? Cancer Father   ? Cancer Sister   ? Cancer Daughter   ? ?Scheduled Meds: ? antiseptic oral rinse  15 mL Topical TID  ? metoprolol tartrate  5 mg Intravenous Q6H  ? ?Continuous Infusions: ?PRN Meds:.acetaminophen **OR** acetaminophen (TYLENOL) oral liquid 160 mg/5 mL **OR** acetaminophen, glycopyrrolate **OR** glycopyrrolate **OR** glycopyrrolate, haloperidol **OR** haloperidol **OR** haloperidol lactate, ipratropium-albuterol, LORazepam **OR** LORazepam **OR** LORazepam, morphine injection, polyvinyl alcohol ?Allergies  ?Allergen Reactions  ? Iodine Other (See Comments)  ?  unknown  ? Erythromycin  Rash  ? Penicillins Rash  ? Sulfa Antibiotics Rash  ? ?Review of Systems  ?Unable to perform ROS: Mental status change  ? ?Physical Exam ?Constitutional:   ?   General: She is not in acute distress. ?   Appearance: She is ill-appearing.  ?   Comments: Looks at me but unable to follow commands or interact in any meaningful way  ?Pulmonary:  ?   Effort: Pulmonary effort is normal.  ?Skin: ?   General: Skin is warm and dry.  ? ? ?Vital Signs: BP (!) 153/69 Comment: decision for comfort care at this time  Pulse 77   Temp (!) 97.5 ?F (36.4 ?C) (Axillary)   Resp 16   Ht _0  (1.626 m)   Wt 39.4 kg   SpO2 99%   BMI 14.91 kg/m?  ?Pain Scale: CPOT ?  ?  ? ? ?SpO2: SpO2: 99 % ?O2 Device:SpO2: 99 % ?O2 Flow Rate: .O2 Flow Rate (L/min): 3 L/min ? ?IO: Intake/output summary:  ?Intake/Output Summary (Last 24 hours) at 09/21/2021 1345 ?Last data filed at 09/21/2021 1000 ?Gross per 24 hour   ?Intake 33.97 ml  ?Output 150 ml  ?Net -116.03 ml  ? ? ?LBM: Last BM Date : 09/20/21 ?Baseline Weight: Weight: 40 kg ?Most recent weight: Weight: 39.4 kg     ?Palliative Assessment/Data: PPS 10% ? ? ? ? ?*Please note

## 2021-09-21 NOTE — Progress Notes (Signed)
Pt with new afib RVR. [place] notified. No new orders at this time.  ? ?

## 2021-09-21 NOTE — Progress Notes (Signed)
OT Cancellation Note ? ?Patient Details ?Name: Amy Wolf ?MRN: [id number] ?DOB: [birth year] ? ? ?Cancelled Treatment:    Reason Eval/Treat Not Completed: Active bedrest order.  OT to continue efforts once bedrest orders lifted.   ? ?Daniyal Tabor D Venus Gilles ?09/21/2021, 12:18 PM ?

## 2021-09-21 NOTE — Progress Notes (Signed)
PT Cancellation Note ? ?Patient Details ?Name: Amy Wolf ?MRN: [id number] ?DOB: Nov 10, 1927 ? ? ?Cancelled Treatment:    Reason Eval/Treat Not Completed: Active bedrest order (per protocol following TNK). Will follow as appropriate.  ? ?Leighton Roach, PT  ?Acute Rehab Services ? Pager [phone] ?Office [phone] ? ? ? ?[place] ?09/21/2021, 7:52 AM ?

## 2021-09-21 NOTE — Progress Notes (Signed)
?  Transition of Care (TOC) Screening Note ? ? ?Patient Details  ?Name: Amy Wolf ?Date of Birth: 06/19/1928 ? ? ?Transition of Care (TOC) CM/SW Contact:    ?Alfredia Ferguson, LCSW ?Phone Number: ?09/21/2021, 8:34 AM ? ? ? ?Transition of Care Department [facility]) has reviewed patient and noted no immediate TOC needs pending continued medical work-up. TOC team will continue to monitor patient advancement through interdisciplinary progression rounds to support any identified discharge supports as needed. If new patient transition needs arise, please place a TOC consult or reach out to [facility] team.  ?  ?

## 2021-09-21 NOTE — Progress Notes (Addendum)
STROKE TEAM PROGRESS NOTE  ? ?INTERVAL HISTORY ?Patient is seen in her room with no family at the bedside.  Yesterday, she was found to have sudden onset right sided weakness, aphasia and right sided gaze deviation.  TNK was given.  Patient resides in a SNF and requires total care at baseline.  She was made a DNR on admission.  Spoke with patient's daughter [name] on the phone regarding patient's situation and prognosis.  [name] does not wish for patient to be intubated in the event of respiratory compromise and is open to [place] discussions.  Palliative care consulted. ? ?Vitals:  ? 09/21/21 0900 09/21/21 0930 09/21/21 1000 09/21/21 1148  ?BP: (!) 151/89 (!) 164/100 (!) 164/90   ?Pulse: (!) 136 (!) 136 (!) 137   ?Resp: (!) '24 19 19   '$ ?Temp:    (!) 97.5 ?F (36.4 ?C)  ?TempSrc:    Axillary  ?SpO2: 95% 100% 100%   ?Weight:      ?Height:      ? ?CBC:  ?Recent Labs  ?Lab 09/20/21 ?1939 09/21/21 ?0919  ?WBC 17.3* 17.8*  ?NEUTROABS 10.9*  --   ?HGB 11.7* 11.7*  ?HCT 37.3 36.9  ?MCV 91.4 90.2  ?PLT 315 293  ? ?Basic Metabolic Panel:  ?Recent Labs  ?Lab 09/20/21 ?1939 09/21/21 ?0919  ?NA 132* 132*  ?K 4.6 5.0  ?CL 98 99  ?CO2 24 19*  ?GLUCOSE 188* 378*  ?BUN 20 26*  ?CREATININE 0.90 1.08*  ?CALCIUM 9.4 9.7  ? ?Lipid Panel:  ?Recent Labs  ?Lab 09/21/21 ?0501  ?CHOL 184  ?TRIG 79  ?HDL 73  ?CHOLHDL 2.5  ?VLDL 16  ?[place] 95  ? ?HgbA1c:  ?Recent Labs  ?Lab 09/21/21 ?0501  ?HGBA1C 5.3  ? ?Urine Drug Screen: No results for input(s): LABOPIA, COCAINSCRNUR, LABBENZ, AMPHETMU, THCU, LABBARB in the last 168 hours.  ?Alcohol Level  ?Recent Labs  ?Lab 09/20/21 ?2208  ?ETH <10  ? ? ?IMAGING past 24 hours ?DG Chest Portable 1 View ? ?Result Date: 09/20/2021 ?CLINICAL DATA:  Concern for aspiration. EXAM: PORTABLE CHEST 1 VIEW COMPARISON:  05/26/2021 FINDINGS: Left-sided pacemaker in place. Patient is post median sternotomy. Stable heart size and mediastinal contours. Aortic atherosclerosis. Multiple overlying monitoring devices partially  obscures assessment. There is patchy right suprahilar opacity. No pneumothorax or large pleural effusion. IMPRESSION: Patchy right suprahilar opacity may be atelectasis, aspiration, or pneumonia. Recommend radiographic follow-up to resolution. Overlying monitoring devices partially obscure assessment. Electronically Signed   By: Keith Rake M.D.   On: 09/20/2021 21:48  ? ?CT HEAD CODE STROKE WO CONTRAST ? ?Result Date: 09/20/2021 ?CLINICAL DATA:  Code stroke EXAM: CT HEAD WITHOUT CONTRAST CT ANGIOGRAPHY OF THE HEAD AND NECK TECHNIQUE: Contiguous axial images were obtained from the base of the skull through the vertex without intravenous contrast. Multidetector CT imaging of the head and neck was performed using the standard protocol during bolus administration of intravenous contrast. Multiplanar CT image reconstructions and MIPs were obtained to evaluate the vascular anatomy. Carotid stenosis measurements (when applicable) are obtained utilizing NASCET criteria, using the distal internal carotid diameter as the denominator. RADIATION DOSE REDUCTION: This exam was performed according to the departmental dose-optimization program which includes automated exposure control, adjustment of the mA and/or kV according to patient size and/or use of iterative reconstruction technique. CONTRAST:  37m OMNIPAQUE IOHEXOL 350 MG/ML SOLN COMPARISON:  None. FINDINGS: CT HEAD FINDINGS Brain: There is no mass, hemorrhage or extra-axial collection. There is generalized atrophy without lobar predilection.  There is hypoattenuation of the periventricular white matter, most commonly indicating chronic ischemic microangiopathy. Vascular: Atherosclerotic calcification of the vertebral and internal carotid arteries at the skull base. No abnormal hyperdensity of the major intracranial arteries or dural venous sinuses. Skull: The visualized skull base, calvarium and extracranial soft tissues are normal. Sinuses/Orbits: Partial  opacification of the left sphenoid sinus. The orbits are normal. ASPECTS ([place] Stroke Program Early CT Score) - Ganglionic level infarction (caudate, lentiform nuclei, internal capsule, insula, M1-M3 cortex): 7 - Supraganglionic infarction (M4-M6 cortex): 3 Total score (0-10 with 10 being normal): 10 CTA NECK FINDINGS SKELETON: There is no bony spinal canal stenosis. No lytic or blastic lesion. OTHER NECK: 2.5 cm left thyroid nodule. UPPER CHEST: No pneumothorax or pleural effusion. No nodules or masses. AORTIC ARCH: There is calcific atherosclerosis of the aortic arch. There is no aneurysm, dissection or hemodynamically significant stenosis of the visualized portion of the aorta. Conventional 3 vessel aortic branching pattern. The visualized proximal subclavian arteries are widely patent. RIGHT CAROTID SYSTEM: There is calcific atherosclerosis at the right ICA bifurcation without hemodynamically significant stenosis by NASCET criteria. LEFT CAROTID SYSTEM: There is atherosclerotic calcification at the carotid bifurcation resulting in approximately 60% stenosis by NASCET criteria. VERTEBRAL ARTERIES: Codominant configuration. Both origins are clearly patent. Moderate-to-severe stenosis of the distal left P2 segment. There is severe stenosis or short segment occlusion of the left V3 V4 junction. CTA HEAD FINDINGS POSTERIOR CIRCULATION: --Vertebral arteries: Bulky calcification within the left V4 segment with severe stenosis. Moderate stenosis of the midportion of the right V4. --Inferior cerebellar arteries: Normal. --Basilar artery: Normal. --Superior cerebellar arteries: Normal. --Posterior cerebral arteries (PCA): Normal. ANTERIOR CIRCULATION: --Intracranial internal carotid arteries: Atherosclerotic calcification of the internal carotid arteries at the skull base without hemodynamically significant stenosis. --Anterior cerebral arteries (ACA): Normal. Both A1 segments are present. Patent anterior communicating  artery (a-comm). --Middle cerebral arteries (MCA): Left M1 occlusion with intermediate distal collateralization. Right M1 is patent. No right MCA stenosis. VENOUS SINUSES: As permitted by contrast timing, patent. ANATOMIC VARIANTS: None Review of the MIP images confirms the above findings. IMPRESSION: 1. No intracranial hemorrhage or mass lesion. ASPECTS is 10. 2. Left M1 occlusion with intermediate distal collateralization. 3. Moderate-to-severe stenosis of the distal left P2 segment. 4. Severe stenosis or short segment occlusion of the left vertebral artery V3-V4 junction. 5. 2.5 cm incidental left thyroid nodule. Recommend thyroid US if clinically warranted given patient age. Reference: J Am Coll Radiol. 2015 Feb;12(2): 143-50 These results were called by telephone at the time of interpretation on 09/20/2021 at 8:17 pm to provider [facility] , who verbally acknowledged these results. Electronically Signed   By: Ulyses Jarred M.D.   On: 09/20/2021 20:17  ? ?CT ANGIO HEAD NECK W WO CM (CODE STROKE) ? ?Result Date: 09/20/2021 ?CLINICAL DATA:  Code stroke EXAM: CT HEAD WITHOUT CONTRAST CT ANGIOGRAPHY OF THE HEAD AND NECK TECHNIQUE: Contiguous axial images were obtained from the base of the skull through the vertex without intravenous contrast. Multidetector CT imaging of the head and neck was performed using the standard protocol during bolus administration of intravenous contrast. Multiplanar CT image reconstructions and MIPs were obtained to evaluate the vascular anatomy. Carotid stenosis measurements (when applicable) are obtained utilizing NASCET criteria, using the distal internal carotid diameter as the denominator. RADIATION DOSE REDUCTION: This exam was performed according to the departmental dose-optimization program which includes automated exposure control, adjustment of the mA and/or kV according to patient size and/or use of iterative  reconstruction technique. CONTRAST:  46m OMNIPAQUE IOHEXOL 350  MG/ML SOLN COMPARISON:  None. FINDINGS: CT HEAD FINDINGS Brain: There is no mass, hemorrhage or extra-axial collection. There is generalized atrophy without lobar predilection. There is hypoattenuation of the periventr

## 2021-09-21 NOTE — Progress Notes (Signed)
PT Cancellation and Discharge Note ? ?Patient Details ?Name: Amy Wolf ?MRN: [id number] ?DOB: 1927/09/21 ? ? ?Cancelled Treatment:    Reason Eval/Treat Not Completed: Other (comment) (orders discontinued, PT sign off for comfort care.) ? ?Leighton Roach, PT  ?Acute Rehab Services ? Pager [phone] ?Office [phone] ? ? ? ?[place] ?09/21/2021, 4:53 PM ?

## 2021-09-22 DIAGNOSIS — Z66 Do not resuscitate: Secondary | ICD-10-CM

## 2021-09-22 DIAGNOSIS — J9601 Acute respiratory failure with hypoxia: Secondary | ICD-10-CM | POA: Diagnosis not present

## 2021-09-22 DIAGNOSIS — I639 Cerebral infarction, unspecified: Secondary | ICD-10-CM

## 2021-09-22 DIAGNOSIS — Z515 Encounter for palliative care: Secondary | ICD-10-CM

## 2021-09-22 DIAGNOSIS — Z789 Other specified health status: Secondary | ICD-10-CM

## 2021-09-22 DIAGNOSIS — I63512 Cerebral infarction due to unspecified occlusion or stenosis of left middle cerebral artery: Secondary | ICD-10-CM | POA: Diagnosis not present

## 2021-09-22 MED ORDER — GLYCOPYRROLATE 0.2 MG/ML IJ SOLN: 0.2000 mg | INTRAMUSCULAR | 0 refills | Status: AC | PRN

## 2021-09-22 MED ORDER — POLYVINYL ALCOHOL 1.4 % OP SOLN: 1.0000 [drp] | Freq: Four times a day (QID) | OPHTHALMIC | 0 refills | Status: AC | PRN

## 2021-09-22 MED ORDER — METOPROLOL TARTRATE 5 MG/5ML IV SOLN: 5.0000 mg | Freq: Four times a day (QID) | INTRAVENOUS | 0 refills | Status: AC

## 2021-09-22 MED ORDER — BIOTENE DRY MOUTH MT LIQD: 15.0000 mL | Freq: Three times a day (TID) | OROMUCOSAL | 0 refills | Status: AC

## 2021-09-22 MED ORDER — IPRATROPIUM-ALBUTEROL 0.5-2.5 (3) MG/3ML IN SOLN: 3.0000 mL | RESPIRATORY_TRACT | 0 refills | Status: AC | PRN

## 2021-09-22 MED ORDER — HALOPERIDOL LACTATE 2 MG/ML PO CONC: 2.0000 mg | ORAL | 0 refills | Status: AC | PRN

## 2021-09-22 MED ORDER — ACETAMINOPHEN 650 MG RE SUPP: 650.0000 mg | RECTAL | 0 refills | Status: AC | PRN

## 2021-09-22 NOTE — TOC Transition Note (Addendum)
Transition of Care (TOC) - CM/SW Discharge Note ? ? ?Patient Details  ?Name: Amy Wolf ?MRN: [id number] ?Date of Birth: 10-Apr-1928 ? ?Transition of Care (TOC) CM/SW Contact:  ?Bartholomew Crews, RN ?Phone Number: [phone] ?09/22/2021, 12:45 PM ? ? ?Clinical Narrative:    ? ?Received call from [place] at [place]. Patient has bed at the [facility]. PTAR "tentatively" arranged for 2pm pick up. NCM reached to MD for DC order/summary.  ? ?Nurse to call report to [phone].  ? ?Spoke with patient's daughter, [name], to update on patient's transition status. Nursing to call [name] when transport arrives.  ? ?No further TOC needs identified.  ? ?Final next level of care: [place] ?Barriers to Discharge: No Barriers Identified ? ? ?Patient Goals and CMS Choice ?  ?  ?  ? ?Discharge Placement ?  ?           ?  ?  ?  ?  ? ?Discharge Plan and Services ?  ?  ?           ?  ?  ?  ?  ?  ?  ?  ?  ?  ?  ? ?Social Determinants of Health (SDOH) Interventions ?  ? ? ?Readmission Risk Interventions ?No flowsheet data found. ? ? ? ? ?

## 2021-09-22 NOTE — Plan of Care (Signed)
?  Problem: Education: ?Goal: Knowledge of disease or condition will improve ?Outcome: Adequate for Discharge ?Goal: Knowledge of secondary prevention will improve (SELECT ALL) ?Outcome: Adequate for Discharge ?Goal: Knowledge of patient specific risk factors will improve (INDIVIDUALIZE FOR PATIENT) ?Outcome: Adequate for Discharge ?Goal: Individualized Educational Video(s) ?Outcome: Adequate for Discharge ?  ?Problem: Coping: ?Goal: Will verbalize positive feelings about self ?Outcome: Adequate for Discharge ?Goal: Will identify appropriate support needs ?Outcome: Adequate for Discharge ?  ?Problem: Health Behavior/Discharge Planning: ?Goal: Ability to manage health-related needs will improve ?Outcome: Adequate for Discharge ?  ?Problem: Self-Care: ?Goal: Ability to participate in self-care as condition permits will improve ?Outcome: Adequate for Discharge ?Goal: Verbalization of feelings and concerns over difficulty with self-care will improve ?Outcome: Adequate for Discharge ?Goal: Ability to communicate needs accurately will improve ?Outcome: Adequate for Discharge ?  ?Problem: Nutrition: ?Goal: Risk of aspiration will decrease ?Outcome: Adequate for Discharge ?Goal: Dietary intake will improve ?Outcome: Adequate for Discharge ?  ?Problem: Ischemic Stroke/TIA Tissue Perfusion: ?Goal: Complications of ischemic stroke/TIA will be minimized ?Outcome: Adequate for Discharge ?  ?

## 2021-09-22 NOTE — Progress Notes (Signed)
Patient discharged per order via stretcher by PTAR to [facility]. Daughter Dawn at bedside. Both PIVs were removed. Report given to [place] at [place]. No patient belongings were present in room.  ?

## 2021-09-22 NOTE — Progress Notes (Signed)
? ?                                                                                                                                                     ?                                                   ?Daily Progress Note  ? ?Patient Name: Amy Wolf       Date: 09/22/2021 ?DOB: [birth year]  Age: 88 y.o. MRN#: [id number] ?Attending Physician: Stroke, Md, MD ?Primary Care Physician: Allwardt, Randa Evens, PA-C ?Admit Date: 09/20/2021 ? ?Reason for Consultation/Follow-up: Disposition, Non pain symptom management, Pain control, Psychosocial/spiritual support, and Terminal Care ? ?Subjective: ?Chart review performed. Received report from primary RN - no acute concerns.  ? ?Went to visit patient at bedside - no family/visitors present. Patient is lying in bed - she does not wake to voice/gentle touch. No signs or non-verbal gestures of pain or discomfort noted. No respiratory distress, increased work of breathing, or secretions noted. She is on 2L O2 [place]. ? ?Patient's window for low risk transfer to hospice is likely closing soon - TOC, Dr. Erlinda Hong, RN notified. TOC state patient has a bed at [facility] today and will be transferring around 2p ? ? ?Length of Stay: 2 ? ?Current Medications: ?Scheduled Meds:  ? antiseptic oral rinse  15 mL Topical TID  ? metoprolol tartrate  5 mg Intravenous Q6H  ? ? ?Continuous Infusions: ? ? ?PRN Meds: ?acetaminophen **OR** acetaminophen (TYLENOL) oral liquid 160 mg/5 mL **OR** acetaminophen, glycopyrrolate **OR** glycopyrrolate **OR** glycopyrrolate, haloperidol **OR** haloperidol **OR** haloperidol lactate, ipratropium-albuterol, LORazepam **OR** LORazepam **OR** LORazepam, morphine injection, polyvinyl alcohol ? ?Physical Exam ?Vitals and nursing note reviewed.  ?Constitutional:   ?   General: She is not in acute distress. ?   Appearance: She is cachectic. She is ill-appearing.  ?Pulmonary:  ?   Effort: No respiratory distress.  ?Skin: ?   General: Skin is warm and dry.   ?Neurological:  ?   Mental Status: She is unresponsive.  ?Psychiatric:     ?   Speech: She is noncommunicative.  ?         ? ?Vital Signs: BP (!) 149/70 (BP Location: Right Arm)   Pulse (!) 52   Temp 98.3 ?F (36.8 ?C) (Axillary)   Resp 13   Ht '5\' 4"'$  (1.626 m)   Wt 39.4 kg   SpO2 100%   BMI 14.91 kg/m?  ?SpO2: SpO2: 100 % ?O2 Device: O2 Device: Room Air ?O2 Flow Rate: O2 Flow Rate (L/min): 2 L/min ? ?Intake/output summary:  ?Intake/Output Summary (Last 24 hours) at 09/22/2021 0957 ?Last data  filed at 09/21/2021 1000 ?Gross per 24 hour  ?Intake 1.5 ml  ?Output --  ?Net 1.5 ml  ? ?LBM: Last BM Date : 09/20/21 ?Baseline Weight: Weight: 40 kg ?Most recent weight: Weight: 39.4 kg ? ?     ?Palliative Assessment/Data: PPS 10% ? ? ? ?Flowsheet Rows   ? ?Flowsheet Row Most Recent Value  ?Intake Tab   ?Referral Department Neurology  ?Unit at Time of Referral ICU  ?Palliative Care Primary Diagnosis Neurology  ?Date Notified 09/21/21  ?Palliative Care Type Return patient Palliative Care  ?Reason for referral Clarify Goals of Care  ?Date of Admission 09/20/21  ?Date first seen by Palliative Care 09/21/21  ?# of days Palliative referral response time 0 Day(s)  ?# of days IP prior to Palliative referral 1  ?Clinical Assessment   ?Psychosocial & Spiritual Assessment   ?Palliative Care Outcomes   ? ?  ? ? ?Patient Active Problem List  ? Diagnosis Date Noted  ? Acute ischemic left MCA stroke ([place]) 09/20/2021  ? Protein-calorie malnutrition, severe 05/29/2021  ? Acute hypoxemic respiratory failure ([place]) 05/26/2021  ? Hypokalemia 05/26/2021  ? Acute respiratory disease due to COVID-19 virus 05/26/2021  ? Chronic systolic CHF (congestive heart failure) ([place]) 05/26/2021  ? Persistent atrial fibrillation ([place]) 01/23/2020  ? Secondary hypercoagulable state ([place]) 01/23/2020  ? Chronic kidney disease (CKD), stage III (moderate) ([place]) 04/04/2019  ? Benign essential HTN 04/01/2019  ? Type 2 diabetes mellitus with diabetic chronic kidney  disease ([place]) 04/01/2019  ? CAD (coronary artery disease) 04/01/2019  ? Hx of CABG 04/01/2019  ? Paroxysmal A-fib ([place]) 04/01/2019  ? Atherosclerosis of right carotid artery 04/01/2019  ? PVD (peripheral vascular disease) ([place]) 04/01/2019  ? AICD (automatic cardioverter/defibrillator) present 04/01/2019  ? Valvular heart disease 04/01/2019  ? CHF (congestive heart failure) ([place]) 04/01/2019  ? Glaucoma 04/01/2019  ? ? ?Palliative Care Assessment & Plan  ? ?Patient Profile: ?88 y.o. female  with past medical history of A-fib, CAD, CABG, ICD, CHF, hypertension, COVID, and urothelial cancer admitted on 09/20/2021 with L MCA stroke, given TNKASE, CTA head/neck showed L M1 occlusion, moderate to severe stnoesis of left P2 and occlusion left vertebral artery.  Patient unable to follow commands and unable to manage secretions.  PMT consulted to discuss goals of care. ? ?Assessment: ?Left MCA infarct s/p TNK likely secondary to atrial fibrillation not on anticoagulation ?Atrial fibrillation with RVR ?Respiratory distress ?Dysphagia ?Terminal care ? ?Recommendations/Plan: ?Continue full comfort measures ?Continue DNR/DNI as previously documented ?Transfer to [facility] in [place] - per [facility] bed available today ?Patient's window for low risk transfer is likely closing soon - if she does not transfer today, will likely be hospital death ?Continue current comfort focused medication regimen - patient appears comfortable ?PMT will continue to follow and support holistically ? ? ?Goals of Care and Additional Recommendations: ?Limitations on Scope of Treatment: Full Comfort Care ? ?Code Status: ? ?  ?Code Status Orders  ?(From admission, onward)  ?  ? ? ?  ? ?  Start     Ordered  ? 09/21/21 1345  Do not attempt resuscitation (DNR)  Continuous       ?Question Answer Comment  ?In the event of cardiac or respiratory ARREST Do not call a ?code blue?   ?In the event of cardiac or respiratory ARREST Do not perform  Intubation, CPR, defibrillation or ACLS   ?In the event of cardiac or respiratory ARREST Use medication by any route, position,  wound care, and other measures to relive pain and suffering. May use oxygen, suction and manual treatment of airway obstruction as needed for comfort.   ?  ? 09/21/21 1345  ? ?  ?  ? ?  ? ?Code Status History   ? ? Date Active Date Inactive Code Status Order ID Comments User Context  ? 09/20/2021 2028 09/21/2021 1345 DNR [id number]  Donnetta Simpers, MD ED  ? 05/26/2021 1851 06/06/2021 2109 Full Code [id number]  Reubin Milan, MD Inpatient  ? ?  ? ? ?Prognosis: ? Hours - Days ? ?Discharge Planning: ?Hospice facility ? ?Care plan was discussed with primary RN, TOC, Dr. Erlinda Hong ? ?Thank you for allowing the Palliative Medicine Team to assist in the care of this patient. ? ? ?Lin Landsman, NP ? ?Please contact Palliative Medicine Team phone at [phone] for questions and concerns.  ? ? ? ? ? ?

## 2021-09-22 NOTE — Discharge Summary (Addendum)
Stroke Discharge Summary  ?Patient ID: Amy Wolf    l ?  MRN: [id number]    ?  DOB: 22-May-1928 ? ?Date of Admission: 09/20/2021 ?Date of Discharge: 09/22/2021 ? ?Attending Physician:  Stroke, Md, MD, Stroke MD ?Consultant(s):    pulmonary/intensive care and palliative care   ?Patient's PCP:  Allwardt, Randa Evens, PA-C ? ?DISCHARGE DIAGNOSIS: left MCA stroke ?Principal Problem: ?  Stroke:  left MCA infarct status post TNK likely secondary to A-fib not on [facility] ? ?Active Problems: ?  Afib RVR ?  HTN ?  HLD ?  DM ?  Respiratory distress ?  Dysphagia  ?  CAD ?  Hematuria with urothelial cancer  ?  S/p Pacemaker ? ? ?Allergies as of 09/22/2021   ? ?   Reactions  ? Iodine Other (See Comments)  ? unknown  ? Erythromycin Rash  ? Penicillins Rash  ? Sulfa Antibiotics Rash  ? ?  ? ?  ?Medication List  ?  ? ?STOP taking these medications   ? ?acetaminophen 500 MG tablet ?Commonly known as: TYLENOL ?Replaced by: acetaminophen 650 MG suppository ?  ?bimatoprost 0.01 % Soln ?Commonly known as: LUMIGAN ?  ?brimonidine 0.15 % ophthalmic solution ?Commonly known as: ALPHAGAN ?  ?Eliquis 2.5 MG Tabs tablet ?Generic drug: apixaban ?  ?ferrous sulfate 325 (65 FE) MG tablet ?  ?furosemide 20 MG tablet ?Commonly known as: LASIX ?  ?glipiZIDE 2.5 MG 24 hr tablet ?Commonly known as: GLUCOTROL XL ?  ?guaiFENesin-dextromethorphan 100-10 MG/5ML syrup ?Commonly known as: ROBITUSSIN DM ?  ?meclizine 25 MG tablet ?Commonly known as: ANTIVERT ?  ?melatonin 5 MG Tabs ?  ?metoprolol succinate 25 MG 24 hr tablet ?Commonly known as: Toprol XL ?  ?mirtazapine 15 MG tablet ?Commonly known as: REMERON ?  ?ondansetron 4 MG disintegrating tablet ?Commonly known as: ZOFRAN-ODT ?  ?pantoprazole 40 MG tablet ?Commonly known as: PROTONIX ?  ?polyethylene glycol 17 g packet ?Commonly known as: MIRALAX / GLYCOLAX ?  ?pravastatin 80 MG tablet ?Commonly known as: PRAVACHOL ?  ?Rhopressa 0.02 % Soln ?Generic drug: Netarsudil Dimesylate ?  ?senna-docusate 8.6-50 MG  tablet ?Commonly known as: Senokot-S ?  ?sodium chloride 1 g tablet ?  ?sucralfate 1 g tablet ?Commonly known as: CARAFATE ?  ?Vitamin D 50 MCG (2000 UT) Caps ?  ? ?  ? ?TAKE these medications   ? ?acetaminophen 650 MG suppository ?Commonly known as: TYLENOL ?Place 1 suppository (650 mg total) rectally every 4 (four) hours as needed for mild pain (or temp > 37.5 C (99.5 F)). ?Replaces: acetaminophen 500 MG tablet ?  ?antiseptic oral rinse Liqd ?Apply 15 mLs topically 3 (three) times daily. ?  ?glycopyrrolate 0.2 MG/ML injection ?Commonly known as: ROBINUL ?Inject 1 mL (0.2 mg total) into the skin every 4 (four) hours as needed (excessive secretions). ?  ?haloperidol 2 MG/ML solution ?Commonly known as: HALDOL ?Place 1 mL (2 mg total) under the tongue every 4 (four) hours as needed for agitation (or delirium). ?  ?ipratropium-albuterol 0.5-2.5 (3) MG/3ML Soln ?Commonly known as: DUONEB ?Take 3 mLs by nebulization every 4 (four) hours as needed. ?  ?metoprolol tartrate 5 MG/5ML Soln injection ?Commonly known as: LOPRESSOR ?Inject 5 mLs (5 mg total) into the vein every 6 (six) hours. ?  ?polyvinyl alcohol 1.4 % ophthalmic solution ?Commonly known as: LIQUIFILM TEARS ?Place 1 drop into both eyes 4 (four) times daily as needed for dry eyes. ?  ? ?  ? ? ?LABORATORY STUDIES ?CBC ?   ?  Component Value Date/Time  ? WBC 17.8 (H) 09/21/2021 0919  ? RBC 4.09 09/21/2021 0919  ? HGB 11.7 (L) 09/21/2021 0919  ? HCT 36.9 09/21/2021 0919  ? PLT 293 09/21/2021 0919  ? MCV 90.2 09/21/2021 0919  ? MCH 28.6 09/21/2021 0919  ? MCHC 31.7 09/21/2021 0919  ? RDW 14.3 09/21/2021 0919  ? LYMPHSABS 5.2 (H) 09/20/2021 1939  ? MONOABS 0.8 09/20/2021 1939  ? EOSABS 0.3 09/20/2021 1939  ? BASOSABS 0.1 09/20/2021 1939  ? ?CMP ?   ?Component Value Date/Time  ? NA 132 (L) 09/21/2021 0919  ? K 5.0 09/21/2021 0919  ? CL 99 09/21/2021 0919  ? CO2 19 (L) 09/21/2021 0919  ? GLUCOSE 378 (H) 09/21/2021 0919  ? BUN 26 (H) 09/21/2021 0919  ? CREATININE 1.08  (H) 09/21/2021 0919  ? CREATININE 1.08 (H) 04/26/2020 7494  ? CALCIUM 9.7 09/21/2021 0919  ? PROT 7.3 09/20/2021 1939  ? ALBUMIN 3.5 09/20/2021 1939  ? AST 38 09/20/2021 1939  ? ALT 25 09/20/2021 1939  ? ALKPHOS 54 09/20/2021 1939  ? BILITOT 0.2 (L) 09/20/2021 1939  ? GFRNONAA 48 (L) 09/21/2021 0919  ? ?COAGS ?Lab Results  ?Component Value Date  ? INR 1.1 09/20/2021  ? INR 1.2 08/01/2021  ? ?Lipid Panel ?   ?Component Value Date/Time  ? CHOL 184 09/21/2021 0501  ? TRIG 79 09/21/2021 0501  ? HDL 73 09/21/2021 0501  ? CHOLHDL 2.5 09/21/2021 0501  ? VLDL 16 09/21/2021 0501  ? [place] 95 09/21/2021 0501  ? ?HgbA1C  ?Lab Results  ?Component Value Date  ? HGBA1C 5.3 09/21/2021  ? ?Urinalysis ?   ?Component Value Date/Time  ? COLORURINE RED (A) 08/01/2021 1811  ? APPEARANCEUR TURBID (A) 08/01/2021 1811  ? LABSPEC  08/01/2021 1811  ?  TEST NOT REPORTED DUE TO COLOR INTERFERENCE OF URINE PIGMENT  ? PHURINE  08/01/2021 1811  ?  TEST NOT REPORTED DUE TO COLOR INTERFERENCE OF URINE PIGMENT  ? GLUCOSEU (A) 08/01/2021 1811  ?  TEST NOT REPORTED DUE TO COLOR INTERFERENCE OF URINE PIGMENT  ? HGBUR (A) 08/01/2021 1811  ?  TEST NOT REPORTED DUE TO COLOR INTERFERENCE OF URINE PIGMENT  ? BILIRUBINUR (A) 08/01/2021 1811  ?  TEST NOT REPORTED DUE TO COLOR INTERFERENCE OF URINE PIGMENT  ? KETONESUR (A) 08/01/2021 1811  ?  TEST NOT REPORTED DUE TO COLOR INTERFERENCE OF URINE PIGMENT  ? PROTEINUR (A) 08/01/2021 1811  ?  TEST NOT REPORTED DUE TO COLOR INTERFERENCE OF URINE PIGMENT  ? NITRITE (A) 08/01/2021 1811  ?  TEST NOT REPORTED DUE TO COLOR INTERFERENCE OF URINE PIGMENT  ? LEUKOCYTESUR (A) 08/01/2021 1811  ?  TEST NOT REPORTED DUE TO COLOR INTERFERENCE OF URINE PIGMENT  ? ?Urine Drug Screen No results found for: LABOPIA, COCAINSCRNUR, [place], [place], THCU, LABBARB  ?Alcohol Level ?   ?Component Value Date/Time  ? ETH <10 09/20/2021 2208  ? ? ? ?SIGNIFICANT DIAGNOSTIC STUDIES ?DG Chest Portable 1 View ? ?Result Date:  09/20/2021 ?CLINICAL DATA:  Concern for aspiration. EXAM: PORTABLE CHEST 1 VIEW COMPARISON:  05/26/2021 FINDINGS: Left-sided pacemaker in place. Patient is post median sternotomy. Stable heart size and mediastinal contours. Aortic atherosclerosis. Multiple overlying monitoring devices partially obscures assessment. There is patchy right suprahilar opacity. No pneumothorax or large pleural effusion. IMPRESSION: Patchy right suprahilar opacity may be atelectasis, aspiration, or pneumonia. Recommend radiographic follow-up to resolution. Overlying monitoring devices partially obscure assessment. Electronically Signed   By: Keith Rake  M.D.   On: 09/20/2021 21:48  ? ?CT HEAD CODE STROKE WO CONTRAST ? ?Result Date: 09/20/2021 ?CLINICAL DATA:  Code stroke EXAM: CT HEAD WITHOUT CONTRAST CT ANGIOGRAPHY OF THE HEAD AND NECK TECHNIQUE: Contiguous axial images were obtained from the base of the skull through the vertex without intravenous contrast. Multidetector CT imaging of the head and neck was performed using the standard protocol during bolus administration of intravenous contrast. Multiplanar CT image reconstructions and MIPs were obtained to evaluate the vascular anatomy. Carotid stenosis measurements (when applicable) are obtained utilizing NASCET criteria, using the distal internal carotid diameter as the denominator. RADIATION DOSE REDUCTION: This exam was performed according to the departmental dose-optimization program which includes automated exposure control, adjustment of the mA and/or kV according to patient size and/or use of iterative reconstruction technique. CONTRAST:  104m OMNIPAQUE IOHEXOL 350 MG/ML SOLN COMPARISON:  None. FINDINGS: CT HEAD FINDINGS Brain: There is no mass, hemorrhage or extra-axial collection. There is generalized atrophy without lobar predilection. There is hypoattenuation of the periventricular white matter, most commonly indicating chronic ischemic microangiopathy. Vascular:  Atherosclerotic calcification of the vertebral and internal carotid arteries at the skull base. No abnormal hyperdensity of the major intracranial arteries or dural venous sinuses. Skull: The visualized skull base, calvarium and extrac

## 2021-09-30 DIAGNOSIS — N189 Chronic kidney disease, unspecified: Secondary | ICD-10-CM | POA: Diagnosis not present

## 2021-09-30 DIAGNOSIS — M1990 Unspecified osteoarthritis, unspecified site: Secondary | ICD-10-CM | POA: Diagnosis not present

## 2021-09-30 DIAGNOSIS — I5022 Chronic systolic (congestive) heart failure: Secondary | ICD-10-CM | POA: Diagnosis not present

## 2021-09-30 DIAGNOSIS — I10 Essential (primary) hypertension: Secondary | ICD-10-CM | POA: Diagnosis not present

## 2021-09-30 DIAGNOSIS — D500 Iron deficiency anemia secondary to blood loss (chronic): Secondary | ICD-10-CM | POA: Diagnosis not present

## 2021-09-30 DIAGNOSIS — E119 Type 2 diabetes mellitus without complications: Secondary | ICD-10-CM | POA: Diagnosis not present

## 2021-09-30 DIAGNOSIS — I255 Ischemic cardiomyopathy: Secondary | ICD-10-CM | POA: Diagnosis not present

## 2021-09-30 DIAGNOSIS — I4891 Unspecified atrial fibrillation: Secondary | ICD-10-CM | POA: Diagnosis not present

## 2021-09-30 DIAGNOSIS — D638 Anemia in other chronic diseases classified elsewhere: Secondary | ICD-10-CM | POA: Diagnosis not present

## 2021-09-30 DIAGNOSIS — I2510 Atherosclerotic heart disease of native coronary artery without angina pectoris: Secondary | ICD-10-CM | POA: Diagnosis not present

## 2021-10-05 DEATH — deceased

## 2021-10-08 ENCOUNTER — Telehealth: Payer: PPO

## 2021-11-04 ENCOUNTER — Ambulatory Visit: Payer: Medicaid Other | Admitting: Cardiovascular Disease

## 2021-11-07 ENCOUNTER — Ambulatory Visit: Payer: PPO

## 2022-07-05 IMAGING — CT CT HEAD CODE STROKE
3 of 4 series · 13 of 47 positions shown, 15 images · non-contrast
Comparison: None.

CLINICAL DATA: Code stroke

EXAM:
CT HEAD WITHOUT CONTRAST
CT ANGIOGRAPHY OF THE HEAD AND NECK
TECHNIQUE: Contiguous axial images were obtained from the base of the skull
through the vertex without intravenous contrast. Multidetector CT
imaging of the head and neck was performed using the standard
protocol during bolus administration of intravenous contrast.
Multiplanar CT image reconstructions and MIPs were obtained to
evaluate the vascular anatomy. Carotid stenosis measurements (when
applicable) are obtained utilizing NASCET criteria, using the distal
internal carotid diameter as the denominator.

[Series 2: head wo · axial · 0.46mm/px · z∈[-120,+0]mm · 7 of 32 slices shown, 9 images]
[im 4/32  brain]
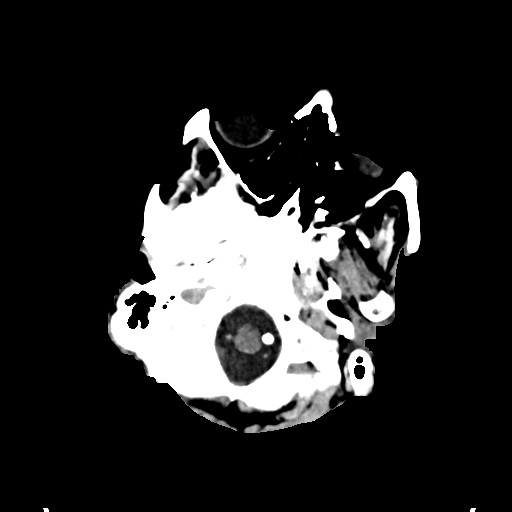
[im 4/32  bone]
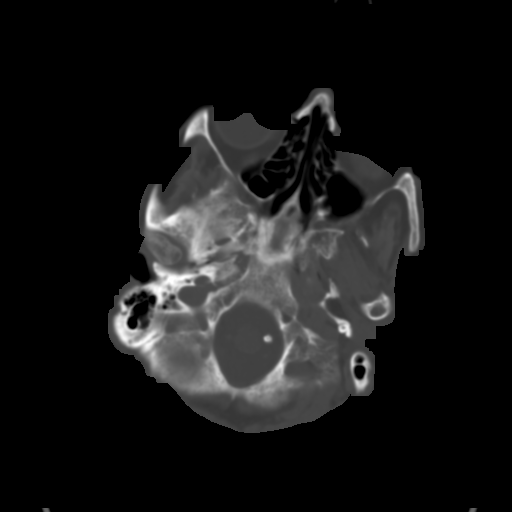
[im 8/32  brain]
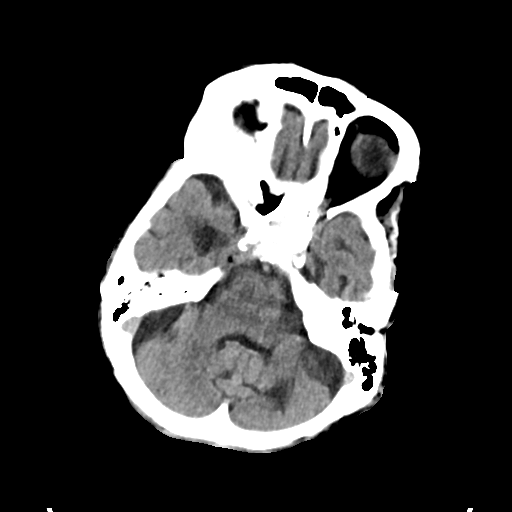
[im 12/32  brain]
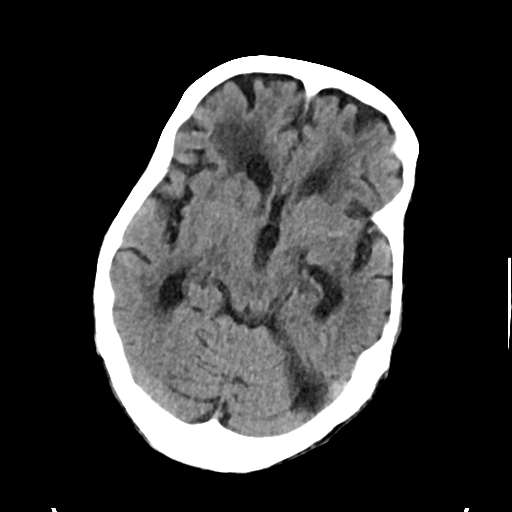
[im 16/32  brain]
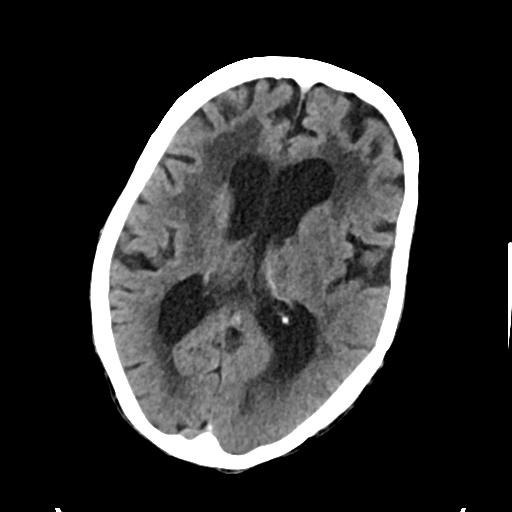
[im 20/32  brain]
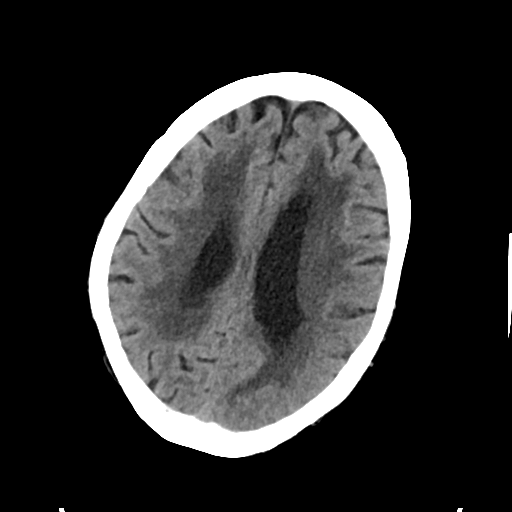
[im 20/32  bone]
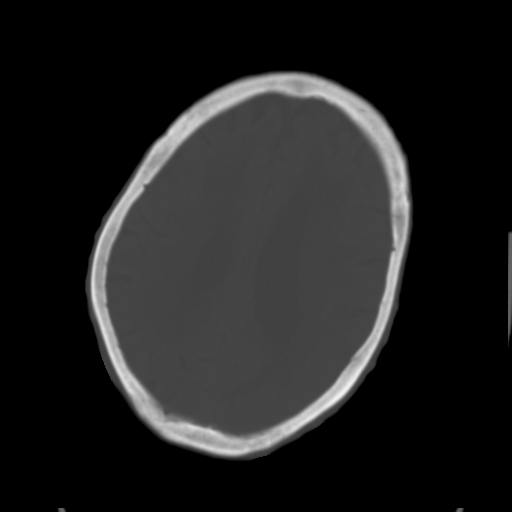
[im 24/32  brain]
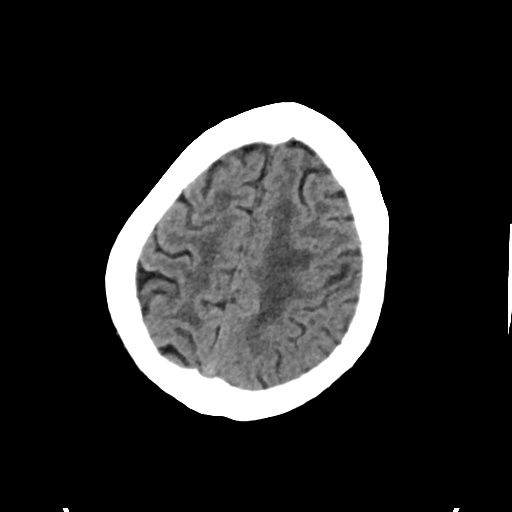
[im 28/32  brain]
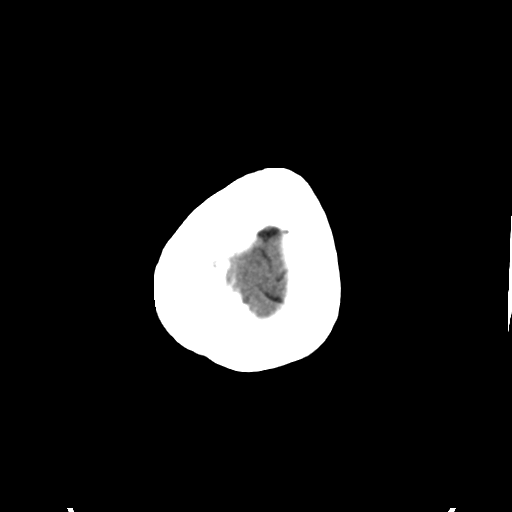

[Series 5: cor soft · coronal · 0.34mm/px · 3 of 72 slices shown]
[im 24/72  brain]
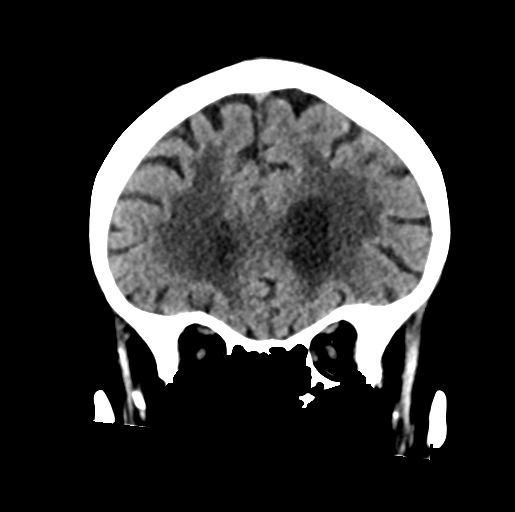
[im 32/72  brain]
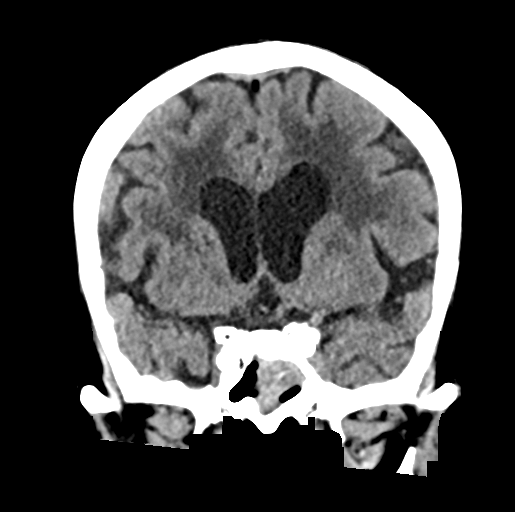
[im 40/72  brain]
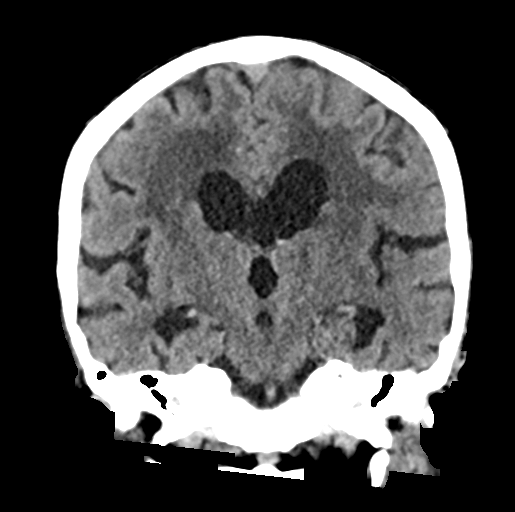

[Series 6: sag soft · sagittal · 0.36mm/px · 3 of 56 slices shown]
[im 19/56  brain]
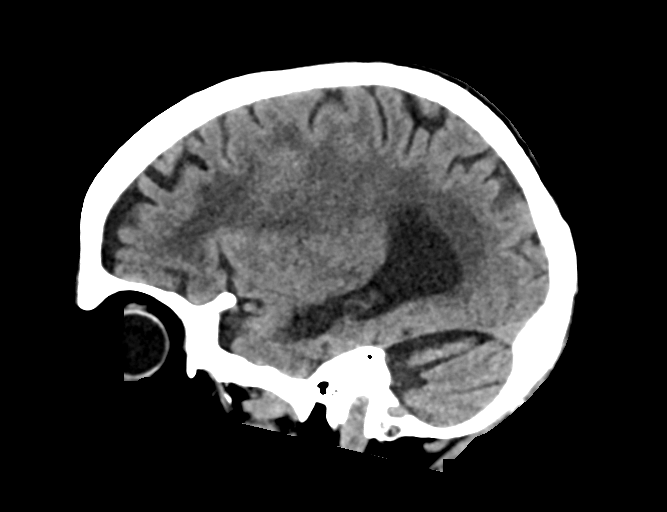
[im 28/56  brain]
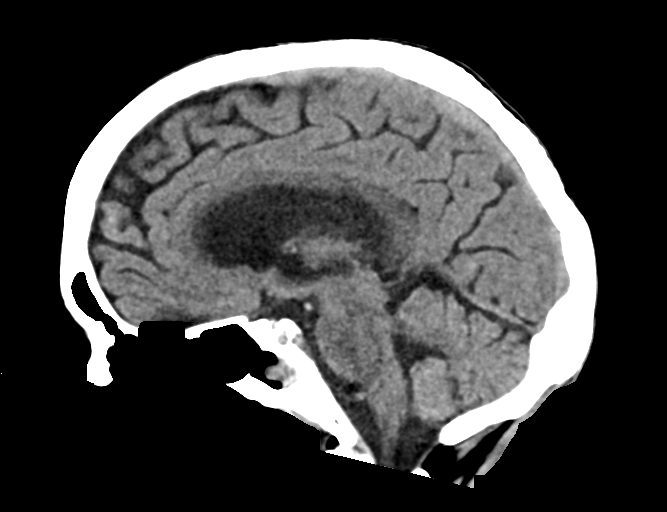
[im 37/56  brain]
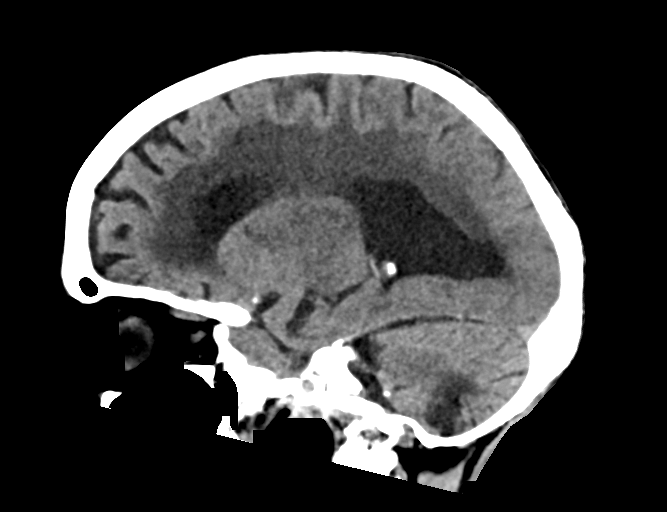

[13 of 47 positions shown; findings below may reference images not displayed]

RADIATION DOSE REDUCTION: This exam was performed according to the
departmental dose-optimization program which includes automated
exposure control, adjustment of the mA and/or kV according to
patient size and/or use of iterative reconstruction technique.

CONTRAST:  75mL OMNIPAQUE IOHEXOL 350 MG/ML SOLN
FINDINGS: CT HEAD FINDINGS

Brain: There is no mass, hemorrhage or extra-axial collection. There
is generalized atrophy without lobar predilection. There is
hypoattenuation of the periventricular white matter, most commonly
indicating chronic ischemic microangiopathy.

Vascular: Atherosclerotic calcification of the vertebral and
internal carotid arteries at the skull base. No abnormal
hyperdensity of the major intracranial arteries or dural venous
sinuses.

Skull: The visualized skull base, calvarium and extracranial soft
tissues are normal.

Sinuses/Orbits: Partial opacification of the left sphenoid sinus.
The orbits are normal.

ASPECTS (Alberta Stroke Program Early CT Score)

- Ganglionic level infarction (caudate, lentiform nuclei, internal
capsule, insula, M1-M3 cortex): 7

- Supraganglionic infarction (M4-M6 cortex): 3

Total score (0-10 with 10 being normal): 10

CTA NECK FINDINGS

SKELETON: There is no bony spinal canal stenosis. No lytic or
blastic lesion.

OTHER NECK: 2.5 cm left thyroid nodule.

UPPER CHEST: No pneumothorax or pleural effusion. No nodules or
masses.

AORTIC ARCH:

There is calcific atherosclerosis of the aortic arch. There is no
aneurysm, dissection or hemodynamically significant stenosis of the
visualized portion of the aorta. Conventional 3 vessel aortic
branching pattern. The visualized proximal subclavian arteries are
widely patent.

RIGHT CAROTID SYSTEM: There is calcific atherosclerosis at the right
ICA bifurcation without hemodynamically significant stenosis by
NASCET criteria.

LEFT CAROTID SYSTEM: There is atherosclerotic calcification at the
carotid bifurcation resulting in approximately 60% stenosis by
NASCET criteria.

VERTEBRAL ARTERIES: Codominant configuration. Both origins are
clearly patent. Moderate-to-severe stenosis of the distal left P2
segment. There is severe stenosis or short segment occlusion of the
left V3 V4 junction.

CTA HEAD FINDINGS

POSTERIOR CIRCULATION:

--Vertebral arteries: Bulky calcification within the left V4 segment
with severe stenosis. Moderate stenosis of the midportion of the
right V4.

--Inferior cerebellar arteries: Normal.

--Basilar artery: Normal.

--Superior cerebellar arteries: Normal.

--Posterior cerebral arteries (PCA): Normal.

ANTERIOR CIRCULATION:

--Intracranial internal carotid arteries: Atherosclerotic
calcification of the internal carotid arteries at the skull base
without hemodynamically significant stenosis.

--Anterior cerebral arteries (ACA): Normal. Both A1 segments are
present. Patent anterior communicating artery (a-comm).

--Middle cerebral arteries (MCA): Left M1 occlusion with
intermediate distal collateralization. Right M1 is patent. No right
MCA stenosis.

VENOUS SINUSES: As permitted by contrast timing, patent.

ANATOMIC VARIANTS: None

Review of the MIP images confirms the above findings.
IMPRESSION: 1. No intracranial hemorrhage or mass lesion. ASPECTS is 10.
2. Left M1 occlusion with intermediate distal collateralization.
3. Moderate-to-severe stenosis of the distal left P2 segment.
4. Severe stenosis or short segment occlusion of the left vertebral
artery V3-V4 junction.
5. 2.5 cm incidental left thyroid nodule. Recommend thyroid US if
clinically warranted given patient age.
Reference: [HOSPITAL]. [DATE]): 143-50

These results were called by telephone at the time of interpretation
on 09/20/2021 at [DATE] to provider [HOSPITAL] LARHONDA , who verbally
acknowledged these results.

## 2022-07-05 IMAGING — DX DG CHEST 1V PORT
1 series · 1 of 1 positions shown · non-contrast
Comparison: 05/26/2021

CLINICAL DATA: Concern for aspiration.

EXAM:
PORTABLE CHEST 1 VIEW

[chest ap]
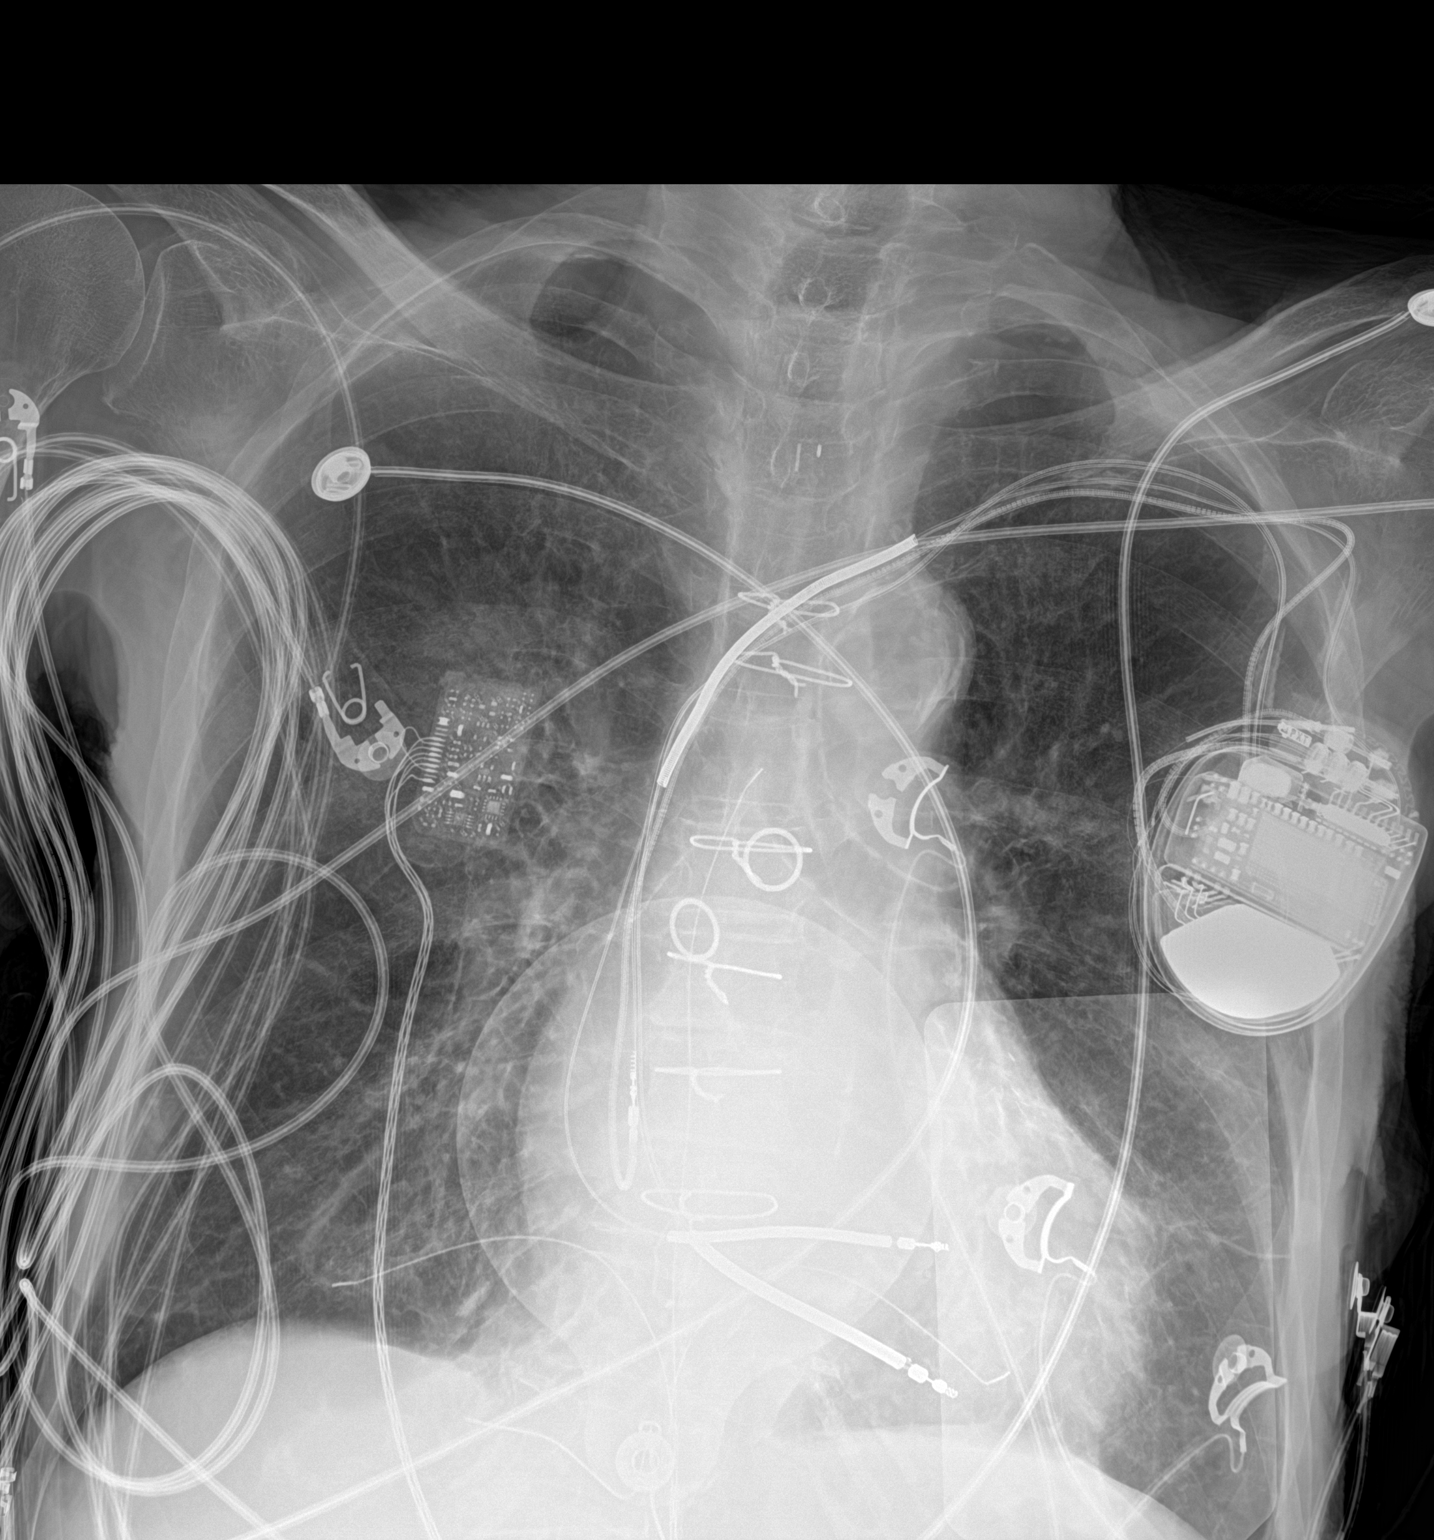

[1 of 1 positions shown; findings below may reference images not displayed]

FINDINGS: Left-sided pacemaker in place. Patient is post median sternotomy.
Stable heart size and mediastinal contours. Aortic atherosclerosis.
Multiple overlying monitoring devices partially obscures assessment.
There is patchy right suprahilar opacity. No pneumothorax or large
pleural effusion.
IMPRESSION: Patchy right suprahilar opacity may be atelectasis, aspiration, or
pneumonia. Recommend radiographic follow-up to resolution.

Overlying monitoring devices partially obscure assessment.
